# Patient Record
Sex: Female | Born: 1935 | Race: White | Hispanic: No | Smoking: Never smoker
Health system: Southern US, Community
[De-identification: ages and names within clinical notes are randomized; demographics above are authoritative.]

## PROBLEM LIST (undated history)

## (undated) DIAGNOSIS — T8789 Other complications of amputation stump: Secondary | ICD-10-CM

## (undated) DIAGNOSIS — T4145XA Adverse effect of unspecified anesthetic, initial encounter: Secondary | ICD-10-CM

## (undated) DIAGNOSIS — C4491 Basal cell carcinoma of skin, unspecified: Secondary | ICD-10-CM

## (undated) DIAGNOSIS — I739 Peripheral vascular disease, unspecified: Secondary | ICD-10-CM

## (undated) DIAGNOSIS — M1990 Unspecified osteoarthritis, unspecified site: Secondary | ICD-10-CM

## (undated) DIAGNOSIS — F419 Anxiety disorder, unspecified: Secondary | ICD-10-CM

## (undated) DIAGNOSIS — K922 Gastrointestinal hemorrhage, unspecified: Secondary | ICD-10-CM

## (undated) DIAGNOSIS — N200 Calculus of kidney: Secondary | ICD-10-CM

## (undated) DIAGNOSIS — M48061 Spinal stenosis, lumbar region without neurogenic claudication: Secondary | ICD-10-CM

## (undated) DIAGNOSIS — I499 Cardiac arrhythmia, unspecified: Secondary | ICD-10-CM

## (undated) DIAGNOSIS — T8859XA Other complications of anesthesia, initial encounter: Secondary | ICD-10-CM

## (undated) DIAGNOSIS — I998 Other disorder of circulatory system: Secondary | ICD-10-CM

## (undated) DIAGNOSIS — E785 Hyperlipidemia, unspecified: Secondary | ICD-10-CM

## (undated) DIAGNOSIS — M549 Dorsalgia, unspecified: Secondary | ICD-10-CM

## (undated) DIAGNOSIS — F329 Major depressive disorder, single episode, unspecified: Secondary | ICD-10-CM

## (undated) DIAGNOSIS — G709 Myoneural disorder, unspecified: Secondary | ICD-10-CM

## (undated) DIAGNOSIS — M419 Scoliosis, unspecified: Secondary | ICD-10-CM

## (undated) DIAGNOSIS — G8929 Other chronic pain: Secondary | ICD-10-CM

## (undated) DIAGNOSIS — I442 Atrioventricular block, complete: Secondary | ICD-10-CM

## (undated) DIAGNOSIS — I6529 Occlusion and stenosis of unspecified carotid artery: Secondary | ICD-10-CM

## (undated) DIAGNOSIS — I10 Essential (primary) hypertension: Secondary | ICD-10-CM

## (undated) DIAGNOSIS — Z950 Presence of cardiac pacemaker: Secondary | ICD-10-CM

## (undated) DIAGNOSIS — I872 Venous insufficiency (chronic) (peripheral): Secondary | ICD-10-CM

## (undated) DIAGNOSIS — F32A Depression, unspecified: Secondary | ICD-10-CM

## (undated) HISTORY — DX: Atrioventricular block, complete: I44.2

## (undated) HISTORY — DX: Unspecified osteoarthritis, unspecified site: M19.90

## (undated) HISTORY — PX: BACK SURGERY: SHX140

## (undated) HISTORY — PX: BASAL CELL CARCINOMA EXCISION: SHX1214

## (undated) HISTORY — DX: Gastrointestinal hemorrhage, unspecified: K92.2

## (undated) HISTORY — DX: Presence of cardiac pacemaker: Z95.0

## (undated) HISTORY — DX: Peripheral vascular disease, unspecified: I73.9

## (undated) HISTORY — DX: Venous insufficiency (chronic) (peripheral): I87.2

## (undated) HISTORY — PX: PR VEIN BYPASS GRAFT,AORTO-FEM-POP: 35551

## (undated) HISTORY — PX: FIXATION KYPHOPLASTY THORACIC SPINE: SHX1643

## (undated) HISTORY — DX: Hyperlipidemia, unspecified: E78.5

## (undated) HISTORY — DX: Essential (primary) hypertension: I10

## (undated) HISTORY — PX: CORNEAL TRANSPLANT: SHX108

## (undated) HISTORY — PX: KNEE ARTHROSCOPY: SHX127

## (undated) HISTORY — PX: REVISION TOTAL HIP ARTHROPLASTY: SHX766

## (undated) HISTORY — PX: HEMIARTHROPLASTY HIP: SUR652

## (undated) HISTORY — DX: Occlusion and stenosis of unspecified carotid artery: I65.29

## (undated) HISTORY — PX: CATARACT EXTRACTION W/ INTRAOCULAR LENS  IMPLANT, BILATERAL: SHX1307

## (undated) HISTORY — PX: TOTAL HIP ARTHROPLASTY: SHX124

---

## 1968-02-18 HISTORY — PX: ANTERIOR CERVICAL DECOMP/DISCECTOMY FUSION: SHX1161

## 1997-11-07 ENCOUNTER — Emergency Department (HOSPITAL_COMMUNITY): Admission: EM | Admit: 1997-11-07 | Discharge: 1997-11-07 | Payer: Self-pay | Admitting: Emergency Medicine

## 1997-11-07 ENCOUNTER — Encounter: Payer: Self-pay | Admitting: Emergency Medicine

## 2000-04-15 ENCOUNTER — Emergency Department (HOSPITAL_COMMUNITY): Admission: EM | Admit: 2000-04-15 | Discharge: 2000-04-15 | Payer: Self-pay | Admitting: Emergency Medicine

## 2000-05-04 ENCOUNTER — Encounter: Admission: RE | Admit: 2000-05-04 | Discharge: 2000-05-04 | Payer: Self-pay | Admitting: Orthopedic Surgery

## 2000-05-04 ENCOUNTER — Encounter: Payer: Self-pay | Admitting: Orthopedic Surgery

## 2000-05-05 ENCOUNTER — Ambulatory Visit (HOSPITAL_BASED_OUTPATIENT_CLINIC_OR_DEPARTMENT_OTHER): Admission: RE | Admit: 2000-05-05 | Discharge: 2000-05-05 | Payer: Self-pay | Admitting: Orthopedic Surgery

## 2000-06-27 ENCOUNTER — Inpatient Hospital Stay (HOSPITAL_COMMUNITY): Admission: EM | Admit: 2000-06-27 | Discharge: 2000-07-02 | Payer: Self-pay | Admitting: *Deleted

## 2000-06-27 ENCOUNTER — Emergency Department (HOSPITAL_COMMUNITY): Admission: EM | Admit: 2000-06-27 | Discharge: 2000-06-27 | Payer: Self-pay | Admitting: Emergency Medicine

## 2000-06-27 ENCOUNTER — Encounter: Payer: Self-pay | Admitting: Vascular Surgery

## 2000-06-30 ENCOUNTER — Encounter: Payer: Self-pay | Admitting: Vascular Surgery

## 2002-10-03 ENCOUNTER — Emergency Department (HOSPITAL_COMMUNITY): Admission: EM | Admit: 2002-10-03 | Discharge: 2002-10-03 | Payer: Self-pay | Admitting: Emergency Medicine

## 2002-10-03 ENCOUNTER — Encounter: Payer: Self-pay | Admitting: Emergency Medicine

## 2003-07-22 ENCOUNTER — Encounter: Admission: RE | Admit: 2003-07-22 | Discharge: 2003-07-22 | Payer: Self-pay | Admitting: Sports Medicine

## 2003-08-10 ENCOUNTER — Encounter: Admission: RE | Admit: 2003-08-10 | Discharge: 2003-08-10 | Payer: Self-pay | Admitting: Sports Medicine

## 2003-08-29 ENCOUNTER — Encounter: Admission: RE | Admit: 2003-08-29 | Discharge: 2003-08-29 | Payer: Self-pay | Admitting: Sports Medicine

## 2003-09-26 ENCOUNTER — Encounter: Admission: RE | Admit: 2003-09-26 | Discharge: 2003-09-26 | Payer: Self-pay | Admitting: Sports Medicine

## 2005-05-19 ENCOUNTER — Other Ambulatory Visit: Admission: RE | Admit: 2005-05-19 | Discharge: 2005-05-19 | Payer: Self-pay | Admitting: Interventional Radiology

## 2005-05-19 ENCOUNTER — Encounter: Admission: RE | Admit: 2005-05-19 | Discharge: 2005-05-19 | Payer: Self-pay | Admitting: Surgery

## 2005-05-19 ENCOUNTER — Encounter (INDEPENDENT_AMBULATORY_CARE_PROVIDER_SITE_OTHER): Payer: Self-pay | Admitting: Specialist

## 2005-10-12 ENCOUNTER — Emergency Department (HOSPITAL_COMMUNITY): Admission: EM | Admit: 2005-10-12 | Discharge: 2005-10-12 | Payer: Self-pay | Admitting: Emergency Medicine

## 2005-11-03 ENCOUNTER — Encounter: Admission: RE | Admit: 2005-11-03 | Discharge: 2005-11-03 | Payer: Self-pay | Admitting: Sports Medicine

## 2005-11-24 ENCOUNTER — Encounter: Admission: RE | Admit: 2005-11-24 | Discharge: 2005-11-24 | Payer: Self-pay | Admitting: Sports Medicine

## 2005-12-15 ENCOUNTER — Encounter: Admission: RE | Admit: 2005-12-15 | Discharge: 2005-12-15 | Payer: Self-pay | Admitting: Sports Medicine

## 2006-02-17 DIAGNOSIS — Z950 Presence of cardiac pacemaker: Secondary | ICD-10-CM

## 2006-02-17 HISTORY — DX: Presence of cardiac pacemaker: Z95.0

## 2006-07-08 ENCOUNTER — Ambulatory Visit: Payer: Self-pay | Admitting: Vascular Surgery

## 2006-09-23 ENCOUNTER — Emergency Department (HOSPITAL_COMMUNITY): Admission: EM | Admit: 2006-09-23 | Discharge: 2006-09-23 | Payer: Self-pay | Admitting: Family Medicine

## 2006-09-28 ENCOUNTER — Ambulatory Visit: Payer: Self-pay | Admitting: Internal Medicine

## 2006-10-30 ENCOUNTER — Ambulatory Visit: Payer: Self-pay | Admitting: Internal Medicine

## 2006-11-09 ENCOUNTER — Ambulatory Visit: Payer: Self-pay

## 2006-11-09 ENCOUNTER — Encounter: Payer: Self-pay | Admitting: Internal Medicine

## 2006-11-27 DIAGNOSIS — Z8679 Personal history of other diseases of the circulatory system: Secondary | ICD-10-CM

## 2006-11-27 DIAGNOSIS — S62109A Fracture of unspecified carpal bone, unspecified wrist, initial encounter for closed fracture: Secondary | ICD-10-CM | POA: Insufficient documentation

## 2006-11-27 DIAGNOSIS — I471 Supraventricular tachycardia: Secondary | ICD-10-CM

## 2006-11-27 DIAGNOSIS — I10 Essential (primary) hypertension: Secondary | ICD-10-CM

## 2007-01-12 ENCOUNTER — Ambulatory Visit: Payer: Self-pay | Admitting: Vascular Surgery

## 2007-03-17 ENCOUNTER — Ambulatory Visit: Payer: Self-pay | Admitting: Internal Medicine

## 2007-03-17 DIAGNOSIS — M1990 Unspecified osteoarthritis, unspecified site: Secondary | ICD-10-CM | POA: Insufficient documentation

## 2007-03-25 ENCOUNTER — Encounter: Payer: Self-pay | Admitting: Internal Medicine

## 2007-03-30 ENCOUNTER — Encounter: Payer: Self-pay | Admitting: Internal Medicine

## 2007-04-08 ENCOUNTER — Telehealth (INDEPENDENT_AMBULATORY_CARE_PROVIDER_SITE_OTHER): Payer: Self-pay | Admitting: *Deleted

## 2007-04-12 ENCOUNTER — Ambulatory Visit: Payer: Self-pay | Admitting: Internal Medicine

## 2007-05-06 ENCOUNTER — Telehealth: Payer: Self-pay | Admitting: Internal Medicine

## 2007-05-26 ENCOUNTER — Encounter: Payer: Self-pay | Admitting: Internal Medicine

## 2007-06-05 ENCOUNTER — Encounter: Payer: Self-pay | Admitting: Internal Medicine

## 2007-06-14 ENCOUNTER — Ambulatory Visit: Payer: Self-pay | Admitting: Internal Medicine

## 2007-07-05 ENCOUNTER — Ambulatory Visit: Payer: Self-pay | Admitting: Surgery

## 2007-07-09 ENCOUNTER — Ambulatory Visit: Payer: Self-pay | Admitting: Internal Medicine

## 2007-07-09 DIAGNOSIS — H8309 Labyrinthitis, unspecified ear: Secondary | ICD-10-CM | POA: Insufficient documentation

## 2007-07-09 DIAGNOSIS — R5383 Other fatigue: Secondary | ICD-10-CM

## 2007-07-09 DIAGNOSIS — E785 Hyperlipidemia, unspecified: Secondary | ICD-10-CM

## 2007-07-09 DIAGNOSIS — R5381 Other malaise: Secondary | ICD-10-CM

## 2007-07-09 LAB — CONVERTED CEMR LAB
BUN: 11 mg/dL (ref 6–23)
Basophils Relative: 0.4 % (ref 0.0–1.0)
CO2: 29 meq/L (ref 19–32)
Calcium: 9.5 mg/dL (ref 8.4–10.5)
Creatinine, Ser: 0.8 mg/dL (ref 0.4–1.2)
Eosinophils Relative: 2.7 % (ref 0.0–5.0)
Glucose, Bld: 84 mg/dL (ref 70–99)
Hemoglobin: 12.9 g/dL (ref 12.0–15.0)
Lymphocytes Relative: 46.6 % — ABNORMAL HIGH (ref 12.0–46.0)
MCHC: 33.6 g/dL (ref 30.0–36.0)
Monocytes Relative: 7.4 % (ref 3.0–12.0)
Neutro Abs: 2.7 10*3/uL (ref 1.4–7.7)
RBC: 4.43 M/uL (ref 3.87–5.11)
VLDL: 14 mg/dL (ref 0–40)

## 2007-07-14 ENCOUNTER — Encounter: Payer: Self-pay | Admitting: Internal Medicine

## 2007-08-03 ENCOUNTER — Telehealth: Payer: Self-pay | Admitting: Internal Medicine

## 2007-08-17 ENCOUNTER — Telehealth: Payer: Self-pay | Admitting: Internal Medicine

## 2007-08-31 ENCOUNTER — Encounter: Payer: Self-pay | Admitting: Internal Medicine

## 2007-09-06 ENCOUNTER — Telehealth (INDEPENDENT_AMBULATORY_CARE_PROVIDER_SITE_OTHER): Payer: Self-pay | Admitting: *Deleted

## 2007-09-28 ENCOUNTER — Ambulatory Visit: Payer: Self-pay | Admitting: Internal Medicine

## 2007-09-28 ENCOUNTER — Inpatient Hospital Stay (HOSPITAL_COMMUNITY): Admission: EM | Admit: 2007-09-28 | Discharge: 2007-10-03 | Payer: Self-pay | Admitting: Emergency Medicine

## 2007-10-08 ENCOUNTER — Encounter: Payer: Self-pay | Admitting: Internal Medicine

## 2007-10-15 ENCOUNTER — Telehealth: Payer: Self-pay | Admitting: Internal Medicine

## 2007-11-02 ENCOUNTER — Ambulatory Visit: Payer: Self-pay | Admitting: Internal Medicine

## 2007-11-02 DIAGNOSIS — D62 Acute posthemorrhagic anemia: Secondary | ICD-10-CM | POA: Insufficient documentation

## 2007-11-02 LAB — CONVERTED CEMR LAB
Basophils Absolute: 0.1 10*3/uL (ref 0.0–0.1)
Calcium: 9.4 mg/dL (ref 8.4–10.5)
Creatinine, Ser: 0.7 mg/dL (ref 0.4–1.2)
GFR calc Af Amer: 103 mL/min
Glucose, Bld: 89 mg/dL (ref 70–99)
HCT: 34.4 % — ABNORMAL LOW (ref 36.0–46.0)
Hemoglobin: 11.9 g/dL — ABNORMAL LOW (ref 12.0–15.0)
MCHC: 34.6 g/dL (ref 30.0–36.0)
Monocytes Absolute: 0.7 10*3/uL (ref 0.1–1.0)
Neutro Abs: 3.7 10*3/uL (ref 1.4–7.7)
RDW: 13.7 % (ref 11.5–14.6)
Sodium: 139 meq/L (ref 135–145)

## 2007-11-04 ENCOUNTER — Encounter: Payer: Self-pay | Admitting: Internal Medicine

## 2007-11-05 ENCOUNTER — Ambulatory Visit: Payer: Self-pay | Admitting: Internal Medicine

## 2007-11-05 ENCOUNTER — Encounter: Payer: Self-pay | Admitting: Internal Medicine

## 2007-11-22 ENCOUNTER — Encounter: Payer: Self-pay | Admitting: Internal Medicine

## 2007-12-28 ENCOUNTER — Ambulatory Visit: Payer: Self-pay | Admitting: Vascular Surgery

## 2008-02-18 HISTORY — PX: INSERT / REPLACE / REMOVE PACEMAKER: SUR710

## 2008-02-18 HISTORY — PX: ATRIAL ABLATION SURGERY: SHX560

## 2008-06-27 ENCOUNTER — Ambulatory Visit: Payer: Self-pay | Admitting: Vascular Surgery

## 2008-11-28 ENCOUNTER — Encounter: Payer: Self-pay | Admitting: Internal Medicine

## 2009-01-01 ENCOUNTER — Encounter: Payer: Self-pay | Admitting: Internal Medicine

## 2009-01-15 ENCOUNTER — Ambulatory Visit: Payer: Self-pay | Admitting: Vascular Surgery

## 2009-04-26 ENCOUNTER — Encounter: Payer: Self-pay | Admitting: Internal Medicine

## 2009-06-20 ENCOUNTER — Ambulatory Visit: Payer: Self-pay | Admitting: Internal Medicine

## 2009-06-20 ENCOUNTER — Inpatient Hospital Stay (HOSPITAL_COMMUNITY): Admission: EM | Admit: 2009-06-20 | Discharge: 2009-06-23 | Payer: Self-pay | Admitting: Pediatrics

## 2009-06-21 ENCOUNTER — Ambulatory Visit: Payer: Self-pay | Admitting: Physical Medicine & Rehabilitation

## 2009-08-06 ENCOUNTER — Ambulatory Visit
Admission: RE | Admit: 2009-08-06 | Discharge: 2009-08-07 | Payer: Self-pay | Source: Home / Self Care | Admitting: Orthopedic Surgery

## 2009-10-11 ENCOUNTER — Ambulatory Visit: Payer: Self-pay | Admitting: Internal Medicine

## 2009-10-11 DIAGNOSIS — I4891 Unspecified atrial fibrillation: Secondary | ICD-10-CM | POA: Insufficient documentation

## 2009-10-16 ENCOUNTER — Ambulatory Visit: Payer: Self-pay | Admitting: Internal Medicine

## 2009-11-11 ENCOUNTER — Ambulatory Visit: Payer: Self-pay | Admitting: Internal Medicine

## 2009-11-12 ENCOUNTER — Encounter: Payer: Self-pay | Admitting: Internal Medicine

## 2009-12-12 ENCOUNTER — Ambulatory Visit (HOSPITAL_COMMUNITY): Admission: RE | Admit: 2009-12-12 | Discharge: 2009-12-12 | Payer: Self-pay | Admitting: Neurological Surgery

## 2009-12-18 ENCOUNTER — Encounter: Payer: Self-pay | Admitting: Internal Medicine

## 2010-02-07 ENCOUNTER — Ambulatory Visit: Payer: Self-pay | Admitting: Surgery

## 2010-02-26 ENCOUNTER — Encounter: Payer: Self-pay | Admitting: Internal Medicine

## 2010-03-12 ENCOUNTER — Telehealth: Payer: Self-pay | Admitting: Internal Medicine

## 2010-03-14 ENCOUNTER — Ambulatory Visit
Admission: RE | Admit: 2010-03-14 | Discharge: 2010-03-14 | Payer: Self-pay | Source: Home / Self Care | Attending: Internal Medicine | Admitting: Internal Medicine

## 2010-03-14 ENCOUNTER — Encounter: Payer: Self-pay | Admitting: Physician Assistant

## 2010-03-14 DIAGNOSIS — I739 Peripheral vascular disease, unspecified: Secondary | ICD-10-CM | POA: Insufficient documentation

## 2010-03-14 DIAGNOSIS — I6529 Occlusion and stenosis of unspecified carotid artery: Secondary | ICD-10-CM | POA: Insufficient documentation

## 2010-03-19 NOTE — Letter (Signed)
Summary: [facility] Return Visit Note   [facility] Return Visit Note   Imported By: Roderic Ovens 01/08/2010 12:54:12  _____________________________________________________________________  External Attachment:    Type:   Image     Comment:   External Document

## 2010-03-19 NOTE — Letter (Signed)
Summary: Sherrell Puller MD/[facility]  Sherrell Puller MD/[facility]   Imported By: Lester [place] 11/07/2009 09:44:07  _____________________________________________________________________  External Attachment:    Type:   Image     Comment:   External Document

## 2010-03-19 NOTE — Assessment & Plan Note (Signed)
Summary: f2y   Primary Provider:  [place]  CC:  rov 2 year.  Pt daughter states that pt has EP in [place] that she sees.  .  History of Present Illness: Regina Hamilton is seen at her daughter's request to establish cardiac care in [place].  She has a history of atrial fibrillation that prompted AV junction ablation and pacing [employer]) last summer (2010). Attempts to initiate therapy with Coumadin concurrently with Cardizem resulted in a rash and the discontinuation of both drugs.  She has had a Myoview in our office in 2008 that was normal echo showed normal left ventricular function. She is seen in cardiac consultation by Dr. Dorthea Cove in May prior to hip replacement surgery.  she is functionally doing very well without limitations of chest pain or shortness of breath or peripheral edema.  She does has a history of vascular disease with femoropopliteal bypass. She has a history of clot in her leg. The chart describes both arterial and venous thrombosis. I am not sure which is the case. [id number] Current Medications (verified): 1)  Norvasc 10 Mg  Tabs (Amlodipine Besylate) .... Once Daily 2)  Bayer Aspirin 325 Mg  Tabs (Aspirin) .... Once Daily 3)  Ester-C   Tbcr (Bioflavonoid Products) .... Once Daily 4)  Potassium Gluconate 595 Mg  Tabs (Potassium Gluconate) .... Once Daily 5)  Ra Calcium Plus Vitamin D 600-125 Mg-Unit Tabs (Calcium-Vitamin D) .Marland Kitchen.. 1 Once Daily 6)  Lotemax 0.5 %  Susp (Loteprednol Etabonate) .... One Drop Each Eye Once A Day 7)  Alphagan P 0.1 %  Soln (Brimonidine Tartrate) .... One Drop Left Eye Twice A Day 8)  Diovan 160 Mg Tabs (Valsartan) .... Take 1 Tablet By Mouth Once A Day 9)  Hydrocodone-Acetaminophen 5-500 Mg  Tabs (Hydrocodone-Acetaminophen) .... As Needed 10)  Robaxin 500 Mg Tabs (Methocarbamol) .Marland Kitchen.. 1 Three Times A Day As Needed 11)  Tekturna 150 Mg Tabs (Aliskiren Fumarate) .Marland Kitchen.. 1 1/2 Once Daily 12)  Hydrochlorothiazide 25 Mg Tabs (Hydrochlorothiazide) ....  1/2 Tab Once Daily 13)  Super Stress B-Complex Cr  Cr-Tabs (Vitamins-Lipotropics) .Marland Kitchen.. 1 Once Daily 14)  Vitamin D 2000 Unit Tabs (Cholecalciferol) .Marland Kitchen.. 1 Once Daily 15)  Stool Softener 100 Mg Caps (Docusate Sodium) .Marland Kitchen.. 1 Once Daily  Allergies (verified): 1)  ! * Narcotics 2)  ! Iron 3)  ! * Avelox 4)  ! Versed  Past History:  Family History: Last updated: Oct 16, 2009 father- deceased @45 ; appendicitis w/ complications. mother - deceased @ 58; CAD Neg- breast, colon cancer; DM  Social History: Last updated: 16-Oct-2009 7th grade married -'47  widowed after 36 years '83 2 daughters -'97, '53;  work- Soil scientist room, 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 Alexia Freestone - [facility]       Opthal - Dr. Valentina Lucks- [place], q 6 months        PVSurgeon - [place]        NS - El[facility]ner, lumbar injections  Ortho - [place]/[facility]  Dr. 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 Thromboembolic risk factors are notable for hypertension, age, gender, vascular disease for a CHADS VASC score of 5 attempts to use Coumadin previously were unsuccessful. I have recommended the use of Pradaxa. He would like to discuss this amongst themselves and with her primary electrophysiologist in [place]. I did urge them to do this quickly as her thrombo- embolic risk is in the range probably of 5-8%annually. An alternative would be to add Plavix to aspirin based on the active W. trial Her updated medication list for this problem includes:    Bayer Aspirin 325 Mg Tabs (Aspirin) ..... Once daily  Problem # 2:  AV BLOCK, COMPLETE S/P AV JUNCTION ABLATION (ICD-426.0) stable following pacemaker Her updated medication list for this problem includes:    Norvasc 10 Mg Tabs (Amlodipine besylate) ..... Once daily    Bayer Aspirin 325 Mg Tabs (Aspirin) ..... Once daily  Problem # 3:  PACEMAKER, PERMANENT [employer] (ICD-V45.01) Device parameters and data were reviewed and no changes were made  Problem # 4:  HYPERTENSION (ICD-401.9) somewhat elevated today. She is on multiple medications. I will defer this to her primary care physician Her updated medication list for this problem includes:    Norvasc 10 Mg Tabs (Amlodipine besylate) ..... Once daily    Bayer Aspirin 325 Mg Tabs (Aspirin) ..... Once daily    Diovan 160 Mg Tabs (Valsartan) .Marland Kitchen... Take 1 tablet by mouth once a day    Tekturna 150 Mg Tabs (Aliskiren fumarate) .Marland Kitchen... 1 1/2 once daily    Hydrochlorothiazide 25 Mg Tabs (Hydrochlorothiazide) .Marland Kitchen... 1/2 tab once daily  Patient Instructions: 1)  Your physician recommends that you schedule a follow-up appointment in: AS NEEDED 2)  Your physician recommends that you continue  on your current medications as directed. Please refer to the Current Medication list given to you today.

## 2010-03-19 NOTE — Assessment & Plan Note (Signed)
Summary: PAIN IN HIP AND LEFT BACK BEHIND RIB CAGE/LB   Vital Signs:  Patient profile:   77 year old female Height:      64 inches Weight:      128 pounds BMI:     22.05 O2 Sat:      98 % on Room air Temp:     97.3 degrees F oral Pulse rate:   70 / minute BP sitting:   140 / 78  (left arm) Cuff size:   regular  Vitals Entered By: Bill Salinas CMA (October 16, 2009 2:00 PM)  O2 Flow:  Room air CC: pt here with c/o hip pain and back pain under left rib cage x 2 weeks/ ab   Primary Care Provider:  Cameryn Schum  CC:  pt here with c/o hip pain and back pain under left rib cage x 2 weeks/ ab.  History of Present Illness: Patient with acute chest wall pain left x 2 weeks. she does not recall any precipitating event.  She has had left THR 4 months ago. she has been walking and doing rehab. She is now having pain in the right Buttock and leg and knee. Most likely due to gait compensation for left knee and hip problems.   Current Medications (verified): 1)  Norvasc 10 Mg  Tabs (Amlodipine Besylate) .... Once Daily 2)  Bayer Aspirin 325 Mg  Tabs (Aspirin) .... Once Daily 3)  Ester-C   Tbcr (Bioflavonoid Products) .... Once Daily 4)  Potassium Gluconate 595 Mg  Tabs (Potassium Gluconate) .... Once Daily 5)  Ra Calcium Plus Vitamin D 600-125 Mg-Unit Tabs (Calcium-Vitamin D) .Marland Kitchen.. 1 Once Daily 6)  Lotemax 0.5 %  Susp (Loteprednol Etabonate) .... One Drop Each Eye Once A Day 7)  Alphagan P 0.1 %  Soln (Brimonidine Tartrate) .... One Drop Left Eye Twice A Day 8)  Diovan 160 Mg Tabs (Valsartan) .... Take 1 Tablet By Mouth Once A Day 9)  Hydrocodone-Acetaminophen 5-500 Mg  Tabs (Hydrocodone-Acetaminophen) .... As Needed 10)  Robaxin 500 Mg Tabs (Methocarbamol) .Marland Kitchen.. 1 Three Times A Day As Needed 11)  Tekturna 150 Mg Tabs (Aliskiren Fumarate) .Marland Kitchen.. 1 1/2 Once Daily 12)  Hydrochlorothiazide 25 Mg Tabs (Hydrochlorothiazide) .... 1/2 Tab Once Daily 13)  Super Stress B-Complex Cr  Cr-Tabs  (Vitamins-Lipotropics) .Marland Kitchen.. 1 Once Daily 14)  Vitamin D 2000 Unit Tabs (Cholecalciferol) .Marland Kitchen.. 1 Once Daily 15)  Stool Softener 100 Mg Caps (Docusate Sodium) .Marland Kitchen.. 1 Once Daily  Allergies (verified): 1)  ! * Narcotics 2)  ! Iron 3)  ! * Avelox 4)  ! Versed 5)  ! Cardizem 6)  ! Coumadin 7)  ! 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 Alexia Freestone - [place] [facility]       Opthal - Dr. Valentina Lucks- [place], q 6 months        PVSurgeon - [place]        NS - [facility], lumbar injections        Ortho - [place]  Dr. 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    Physical Exam  General:  WNWD elderly white female in no acute distress Head:  normocephalic and atraumatic.   Eyes:  C&S clear Ears:  R ear normal and L ear normal.   Lungs:  normal respiratory effort and normal breath sounds.   Heart:  normal rate and regular rhythm.   Msk:  Very tender in the  intercostal space at the posterior axillary line left ribs - 6-9.  Very tender to palpation of the piriformis right buttock. No tenderness to palpation of the right thigh and knee.  Neurologic:  alert & oriented X3 and cranial nerves II-XII intact.   Skin:  turgor normal, color normal, and no suspicious lesions.   Psych:  Oriented X3, memory intact for recent and remote, and good eye contact.     Impression & Recommendations:  Problem # 1:  CHEST WALL PAIN, ACUTE (EAV-409.81) patient with ntercostal inflammation left R6-9.   Plan - voltaren gel 4 g qid to affected area           meloxicam 7.5 mg  Her updated medication list for this problem includes:    Bayer Aspirin 325 Mg Tabs (Aspirin) ..... Once daily    Meloxicam 7.5 Mg Tabs (Meloxicam) .Marland Kitchen... 1 by mouth once daily  Problem # 2:  LEG PAIN, RIGHT (ICD-729.5) Patient with buttock pain c/w piriformis syndrome most likely due to gait alteration from left THR  Plan - meloxicam as above.   Complete Medication List: 1)  Norvasc 10 Mg Tabs (Amlodipine besylate) .... Once daily 2)  Bayer Aspirin 325 Mg Tabs (Aspirin) .... Once daily 3)  Ester-c Tbcr (Bioflavonoid products) .... Once daily 4)  Potassium Gluconate 595 Mg Tabs (Potassium gluconate) .... Once daily 5)  Ra Calcium Plus Vitamin D 600-125 Mg-unit Tabs (Calcium-vitamin d) .Marland Kitchen.. 1 once daily 6)  Lotemax 0.5 % Susp (Loteprednol etabonate) .... One drop each eye once a day 7)  Alphagan P 0.1 % Soln (Brimonidine tartrate) .... One drop left eye twice a day 8)  Diovan 160 Mg Tabs (Valsartan) .... Take 1 tablet by mouth once a day 9)  Hydrocodone-acetaminophen 5-500 Mg Tabs (Hydrocodone-acetaminophen) .... As needed 10)  Tekturna 150 Mg Tabs (Aliskiren fumarate) .Marland Kitchen.. 1 1/2 once daily 11)  Hydrochlorothiazide 25 Mg Tabs (Hydrochlorothiazide) .... 1/2 tab once daily 12)  Super Stress B-complex Cr Cr-tabs (Vitamins-lipotropics) .Marland Kitchen.. 1 once daily 13)  Vitamin D 2000 Unit Tabs  (Cholecalciferol) .Marland Kitchen.. 1 once daily 14)  Stool Softener 100 Mg Caps (Docusate sodium) .Marland Kitchen.. 1 once daily 15)  Meloxicam 7.5 Mg Tabs (Meloxicam) .Marland Kitchen.. 1 by mouth once daily 16)  Flector 1.3 % Ptch (Diclofenac epolamine) .... Apply patch to painful chest wll two times a day.  Other Orders: Home Health Referral (Home Health) Prescriptions: FLECTOR 1.3 % PTCH (DICLOFENAC EPOLAMINE) apply patch to painful chest wll two times a day.  #60 ptch x 12   Entered and Authorized by:   Jacques Navy MD   Signed by:   Jacques Navy MD on  10/16/2009   Method used:   Electronically to        [facility]  [phone]* (retail)       [address]       [place], [place]  [address]       Ph: [phone] or [phone]       Fax: [phone]   RxID:   [id number] MELOXICAM 7.5 MG TABS (MELOXICAM) 1 by mouth once daily  #30 x 12   Entered and Authorized by:   Jacques Navy MD   Signed by:   Jacques Navy MD on 10/16/2009   Method used:   Electronically to        [facility]  [phone]* (retail)       [address]       [place], [place]  [address]       Ph: [phone] or [phone]       Fax: [phone]   RxID:   [id number]

## 2010-03-19 NOTE — Miscellaneous (Signed)
Summary: Face to face encounter/[facility]  Face to face encounter/[facility]   Imported By: Sherian Rein 11/14/2009 14:38:35  _____________________________________________________________________  External Attachment:    Type:   Image     Comment:   External Document

## 2010-03-19 NOTE — Miscellaneous (Signed)
Summary: Care Plans/[facility]  Care Plans/[facility]   Imported By: Sherian Rein 11/13/2009 11:45:06  _____________________________________________________________________  External Attachment:    Type:   Image     Comment:   External Document

## 2010-03-19 NOTE — Procedures (Signed)
Summary: [facility]   Current Medications (verified): 1)  Norvasc 10 Mg  Tabs (Amlodipine Besylate) .... Once Daily 2)  Bayer Aspirin 325 Mg  Tabs (Aspirin) .... Once Daily 3)  Ester-C   Tbcr (Bioflavonoid Products) .... Once Daily 4)  Potassium Gluconate 595 Mg  Tabs (Potassium Gluconate) .... Once Daily 5)  Ra Calcium Plus Vitamin D 600-125 Mg-Unit Tabs (Calcium-Vitamin D) .Marland Kitchen.. 1 Once Daily 6)  Lotemax 0.5 %  Susp (Loteprednol Etabonate) .... One Drop Each Eye Once A Day 7)  Alphagan P 0.1 %  Soln (Brimonidine Tartrate) .... One Drop Left Eye Twice A Day 8)  Diovan 160 Mg Tabs (Valsartan) .... Take 1 Tablet By Mouth Once A Day 9)  Hydrocodone-Acetaminophen 5-500 Mg  Tabs (Hydrocodone-Acetaminophen) .... As Needed 10)  Robaxin 500 Mg Tabs (Methocarbamol) .Marland Kitchen.. 1 Three Times A Day As Needed 11)  Tekturna 150 Mg Tabs (Aliskiren Fumarate) .Marland Kitchen.. 1 1/2 Once Daily 12)  Hydrochlorothiazide 25 Mg Tabs (Hydrochlorothiazide) .... 1/2 Tab Once Daily 13)  Super Stress B-Complex Cr  Cr-Tabs (Vitamins-Lipotropics) .Marland Kitchen.. 1 Once Daily 14)  Vitamin D 2000 Unit Tabs (Cholecalciferol) .Marland Kitchen.. 1 Once Daily 15)  Stool Softener 100 Mg Caps (Docusate Sodium) .Marland Kitchen.. 1 Once Daily  Allergies (verified): 1)  ! * Narcotics 2)  ! Iron 3)  ! * Avelox 4)  ! Versed  PPM Specifications Following MD:  Sherryl Manges, MD     PPM Vendor:  [facility] [employer]     PPM Model Number:  [id number]     PPM Serial Number:  [id number] PPM DOI:  09/12/2008     PPM Implanting MD:  Windell Moment  Lead 1    Location: RA     DOI: 09/12/2008     Model #: [id number]     Serial #: [id number]     Status: active Lead 2    Location: RV     DOI: 09/12/2008     Model #: [id number]     Serial #: [id number]     Status: active  PPM Follow Up Battery Voltage:  GOOD V     Battery Est. Longevity:  >5 YRS     Pacer Dependent:  Yes       PPM Device Measurements Atrium  Amplitude: 2.5 mV, Impedance: 520 ohms,  Right Ventricle  Amplitude: PACED mV, Impedance: 520 ohms,  Threshold: 0.5 V at 0.5 msec  Episodes MS Episodes:  7.4K     Percent Mode Switch:  70%     Ventricular High Rate:  0     Atrial Pacing:  15%     Ventricular Pacing:  100%  Parameters Mode:  DDDR     Lower Rate Limit:  70     Upper Rate Limit:  120 Paced AV Delay:  80     Sensed AV Delay:  150 Tech Comments:  PT ESTABLISHING CARE IN [place]--HAS EP IN [place].  70% OF TIME IN MODE SWITCH.  - COUMADIN.  PACER DEPENDENT AT VVI 30.  NORMAL DEVICE FUNCTION.  TURNED RATE RESPONSE ON DURING MODE SWITCH.  NO OV OR DEVICE CHECKS SCHEDULED-as needed BASIS. Vella Kohler  October 11, 2009 10:06 AM

## 2010-03-19 NOTE — Cardiovascular Report (Signed)
Summary: Office Visit   Office Visit   Imported By: Roderic Ovens 10/30/2009 11:15:57  _____________________________________________________________________  External Attachment:    Type:   Image     Comment:   External Document

## 2010-03-20 ENCOUNTER — Encounter (INDEPENDENT_AMBULATORY_CARE_PROVIDER_SITE_OTHER): Payer: Medicare Other

## 2010-03-20 ENCOUNTER — Ambulatory Visit: Admit: 2010-03-20 | Payer: Self-pay | Admitting: Vascular Surgery

## 2010-03-20 ENCOUNTER — Ambulatory Visit (INDEPENDENT_AMBULATORY_CARE_PROVIDER_SITE_OTHER): Payer: Medicare Other | Admitting: Vascular Surgery

## 2010-03-20 ENCOUNTER — Other Ambulatory Visit (INDEPENDENT_AMBULATORY_CARE_PROVIDER_SITE_OTHER): Payer: Medicare Other

## 2010-03-20 DIAGNOSIS — I6529 Occlusion and stenosis of unspecified carotid artery: Secondary | ICD-10-CM

## 2010-03-20 DIAGNOSIS — Z48812 Encounter for surgical aftercare following surgery on the circulatory system: Secondary | ICD-10-CM

## 2010-03-20 DIAGNOSIS — I739 Peripheral vascular disease, unspecified: Secondary | ICD-10-CM

## 2010-03-20 DIAGNOSIS — I70219 Atherosclerosis of native arteries of extremities with intermittent claudication, unspecified extremity: Secondary | ICD-10-CM

## 2010-03-21 NOTE — Progress Notes (Signed)
Summary: pt needs surgical clearence  Phone Note Call from Patient   Caller: Daughter Darl Pikes [phone] or pt cell [phone] Reason for Call: Talk to Nurse Summary of Call: pt needs knee replacment surgery and will be put on a waitlist to be worked in if anythings opens up anytime soon, but needs surgical clearence, does dr Graciela Husbands need to see her to clear her? if so when can he see her? Initial call taken by: Glynda Jaeger,  March 12, 2010 8:35 AM  Follow-up for Phone Call        spoke w/Susan and will discuss w/Dr. Graciela Husbands to get clearance for Dr. Charlann Boxer. Follow-up by: Claris Gladden RN,  March 12, 2010 11:27 AM  Additional Follow-up for Phone Call Additional follow up Details #1::        per Dr. Graciela Husbands, set up appt to get clearance w/Scott.      Appended Document: pt needs surgical clearence set up appt w/Scott on thursday for pt. Claris Gladden, RN, BSN

## 2010-03-21 NOTE — Assessment & Plan Note (Addendum)
Summary: surgical clearance for knee surgery   Visit Type:  surg clearance Primary Provider:  Jacques Navy MD  CC:  revision of left hip surgery...denies any cardiac complaints today.....  History of Present Illness: Primary Electrophysiologist:  Dr. Sherryl Manges  Regina Hamilton is a 77 yo female with  a history of atrial fibrillation, s/p AV junction ablation and pacer implant [employer]) in 2010. Attempts to initiate therapy with Coumadin concurrently with Cardizem resulted in a rash and the discontinuation of both drugs.  She has carotid disease followed by Dr. Edilia Bo.  Her last doppler u/s I have available to me in 12/2008 demonstrated 1-39% RICA and 60-79% LICA stenosis.  She has PAD and had a left fem-pop bypass in the past.  Her last echo was in 10/2006 and demonstrated an EF 55%; mild MR and mild LAE.  A myoview was done in 2008 and was normal.  When she last saw Dr Graciela Husbands in 09/2009, Pradaxa was mentioned but the patient was to consider this.  She returns today for preop evaluation for revision of left THR on 04/08/10 with Dr. Charlann Boxer.    She denies chest pain or shortness of breath.  She is actually quite active for her age.  She rides a stationary bicycle several times a day.  She cleans her house, she walks, she does static exercises.  She denies any chest pain or shortness of breath with these.  She denies syncope.  She denies orthopnea, PND.  She has mild pedal edema.  Current Medications (verified): 1)  Norvasc 10 Mg  Tabs (Amlodipine Besylate) .... Once Daily 2)  Bayer Aspirin 325 Mg  Tabs (Aspirin) .... Once Daily 3)  Ester-C   Tbcr (Bioflavonoid Products) .... Once Daily 4)  Ra Calcium Plus Vitamin D 600-125 Mg-Unit Tabs (Calcium-Vitamin D) .Marland Kitchen.. 1 Once Daily 5)  Lotemax 0.5 %  Susp (Loteprednol Etabonate) .... One Drop Each Eye Once A Day 6)  Alphagan P 0.1 %  Soln (Brimonidine Tartrate) .... One Drop Left Eye Twice A Day 7)  Diovan 160 Mg Tabs (Valsartan) .... Take 1  Tablet By Mouth Once A Day 8)  Hydrocodone-Acetaminophen 5-500 Mg  Tabs (Hydrocodone-Acetaminophen) .... As Needed 9)  Tekturna 150 Mg Tabs (Aliskiren Fumarate) .Marland Kitchen.. 1 1/2 Once Daily 10)  Hydrochlorothiazide 25 Mg Tabs (Hydrochlorothiazide) .... 1/2 Tab Once Daily 11)  Super Stress B-Complex Cr  Cr-Tabs (Vitamins-Lipotropics) .Marland Kitchen.. 1 Once Daily 12)  Vitamin D 2000 Unit Tabs (Cholecalciferol) .Marland Kitchen.. 1 Once Daily 13)  Stool Softener 100 Mg Caps (Docusate Sodium) .Marland Kitchen.. 1 Once Daily  Allergies: 1)  ! * Narcotics 2)  ! Iron 3)  ! * Avelox 4)  ! Versed 5)  ! Cardizem 6)  ! Coumadin 7)  ! 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 Alexia Freestone - [place]       Opthal - Dr. Valentina Lucks- [place], q 6 months        PVSurgeon - [place]        NS - [place], lumbar injections  Ortho - [facility]  Dr. Margaretha Sheffield  Past Surgical History: Last updated: 10/16/2009 * CERVICAL FUSION '80 * FEMORAL-POPLITEAL BYPASS (LEFT)-'02 Right Knee arthroscopy '02 Cornea transplants '02, '05 Thyroid nodule excised '60s Epidural injections- x 3 Total hip replacement-right ASug '09 status post pacemaker insertion THR left May '11 Left knee arthroscopy June '11   G2P2  Review of Systems       As per  the HPI.  All other systems reviewed and negative.   Vital Signs:  Patient profile:   77 year old female Height:      64 inches Weight:      124.50 pounds BMI:     21.45 Pulse rate:   70 / minute Pulse rhythm:   regular BP  sitting:   148 / 54  (left arm) Cuff size:   regular  Vitals Entered By: Danielle Rankin, CMA (March 14, 2010 4:01 PM)  Physical Exam  General:  Well nourished, well developed, in no acute distress HEENT: normal Neck: no JVD Cardiac:  normal S1, S2; RRR; no murmur Lungs:  clear to auscultation bilaterally, no wheezing, rhonchi or rales Abd: soft, nontender, no hepatomegaly Ext: trace ankle edema Vascular: I cannot appreciate carotid  bruits Skin: warm and dry Neuro:  CNs 2-12 intact, no focal abnormalities noted    EKG  Procedure date:  03/14/2010  Findings:      prob underlying AFib V paced HR 70  PPM Specifications Following MD:  Sherryl Manges, MD     PPM Vendor:  [facility] [employer]     PPM Model Number:  [id number]     PPM Serial Number:  [id number] PPM DOI:  09/12/2008     PPM Implanting MD:  Windell Moment  Lead 1    Location: RA     DOI: 09/12/2008     Model #: [id number]     Serial #: [id number]     Status: active Lead 2    Location: RV     DOI: 09/12/2008     Model #: [id number]     Serial #: [id number]     Status: active  PPM Follow Up Pacer Dependent:  Yes      Parameters Mode:  DDDR     Lower Rate Limit:  70     Upper Rate Limit:  120 Paced AV Delay:  80     Sensed AV Delay:  150  Impression & Recommendations:  Problem # 1:  PRE-OPERATIVE CARDIOVASCULAR EXAMINATION (ICD-V72.81) She can definitely achieve 4 METS or greater without chest pain or shortness of breath.  Currently, she does not have any unstable cardiac conditions.  She had a normal Myoview study in 2008.  She has no known CAD.  According to ACC/AHA guidelines, she requires no further cardiac workup prior to her noncardiac surgery.  I discussed this with Dr. Myrtis Ser, who agreed.  She will likely be at mild to moderate risk.  Our service will certainly be available in the perioperative period as necessary.  Problem # 2:  CAROTID ARTERY STENOSIS, BILATERAL (ICD-433.10) She has followup with Dr. Edilia Bo next week.  Problem # 3:   UNSPECIFIED PERIPHERAL VASCULAR DISEASE (ICD-443.9) She has followup with Dr. Edilia Bo next week.  Problem # 4:  ATRIAL FIBRILLATION (ICD-427.31) Rate is controlled.  Problem # 5:  PACEMAKER, PERMANENT [employer] (ICD-V45.01) Her pacer rep can be contacted if there are any problems during her procedure.  Problem # 6:  HYPERTENSION (ICD-401.9) Slightly elevated.  This may be from pain.  Monitor for now.

## 2010-03-21 NOTE — Op Note (Signed)
Summary: [facility]   Imported By: Sherian Rein 03/05/2010 09:10:56  _____________________________________________________________________  External Attachment:    Type:   Image     Comment:   External Document

## 2010-03-27 ENCOUNTER — Encounter (HOSPITAL_COMMUNITY): Payer: Medicare Other | Attending: Orthopedic Surgery

## 2010-03-27 DIAGNOSIS — M810 Age-related osteoporosis without current pathological fracture: Secondary | ICD-10-CM | POA: Insufficient documentation

## 2010-03-27 DIAGNOSIS — I739 Peripheral vascular disease, unspecified: Secondary | ICD-10-CM | POA: Insufficient documentation

## 2010-03-27 DIAGNOSIS — Y831 Surgical operation with implant of artificial internal device as the cause of abnormal reaction of the patient, or of later complication, without mention of misadventure at the time of the procedure: Secondary | ICD-10-CM | POA: Insufficient documentation

## 2010-03-27 DIAGNOSIS — Z950 Presence of cardiac pacemaker: Secondary | ICD-10-CM | POA: Insufficient documentation

## 2010-03-27 DIAGNOSIS — Z01812 Encounter for preprocedural laboratory examination: Secondary | ICD-10-CM | POA: Insufficient documentation

## 2010-03-27 DIAGNOSIS — T84099A Other mechanical complication of unspecified internal joint prosthesis, initial encounter: Secondary | ICD-10-CM | POA: Insufficient documentation

## 2010-03-27 DIAGNOSIS — Z96649 Presence of unspecified artificial hip joint: Secondary | ICD-10-CM | POA: Insufficient documentation

## 2010-03-27 DIAGNOSIS — I10 Essential (primary) hypertension: Secondary | ICD-10-CM | POA: Insufficient documentation

## 2010-03-27 LAB — DIFFERENTIAL
Basophils Absolute: 0 10*3/uL (ref 0.0–0.1)
Basophils Relative: 0 % (ref 0–1)
Eosinophils Absolute: 0.1 10*3/uL (ref 0.0–0.7)
Neutrophils Relative %: 56 % (ref 43–77)

## 2010-03-27 LAB — BASIC METABOLIC PANEL
Chloride: 98 mEq/L (ref 96–112)
Creatinine, Ser: 0.94 mg/dL (ref 0.4–1.2)
GFR calc Af Amer: >60 mL/min (ref >60)
Potassium: 3.8 mEq/L (ref 3.5–5.1)

## 2010-03-27 LAB — URINALYSIS, ROUTINE W REFLEX MICROSCOPIC
Nitrite: NEGATIVE
Specific Gravity, Urine: 1.013 (ref 1.005–1.030)
Urobilinogen, UA: 0.2 mg/dL (ref 0.0–1.0)
pH: 6.5 (ref 5.0–8.0)

## 2010-03-27 LAB — APTT: aPTT: 31 seconds (ref 24–37)

## 2010-03-27 LAB — PROTIME-INR
INR: 0.92 (ref 0.00–1.49)
Prothrombin Time: 12.6 seconds (ref 11.6–15.2)

## 2010-03-27 LAB — CBC
MCH: 30 pg (ref 26.0–34.0)
Platelets: 222 10*3/uL (ref 150–400)
RBC: 4.46 MIL/uL (ref 3.87–5.11)
WBC: 8.5 10*3/uL (ref 4.0–10.5)

## 2010-03-27 LAB — SURGICAL PCR SCREEN: MRSA, PCR: NEGATIVE

## 2010-03-29 NOTE — Procedures (Unsigned)
CAROTID DUPLEX EXAM  INDICATION:  Carotid disease.  HISTORY: Diabetes:  No Cardiac:  Pacemaker Hypertension:  Yes Smoking:  No Previous Surgery:  Left femoral-to-popliteal bypass graft Jun 30, 2000. CV History:  Currently asymptomatic. Amaurosis Fugax No, Paresthesias No, Hemiparesis No                                      RIGHT             LEFT Brachial systolic pressure:         172               164 Brachial Doppler waveforms:         normal            normal Vertebral direction of flow:        antegrade         antegrade DUPLEX VELOCITIES (cm/sec) CCA peak systolic                   59                M=66, D=197 ECA peak systolic                   85                216 ICA peak systolic                   62                222 ICA end diastolic                   16                27 PLAQUE MORPHOLOGY:                  mixed             mixed PLAQUE AMOUNT:                      Mild              Moderate PLAQUE LOCATION:                    ICA, ECA          ICA, ECA, CCA  IMPRESSION: 1. Right internal carotid artery velocities suggest 1% to 39%     stenosis. 2. Left internal carotid artery peak systolic velocity suggests 60% to     79% stenosis. 3. No significant change in velocities when compared to the previous     exam 01/15/2009.  ___________________________________________ Di Kindle. Edilia Bo, M.D.  EM/MEDQ  D:  03/21/2010  T:  03/21/2010  Job:  [id number]

## 2010-03-29 NOTE — Assessment & Plan Note (Signed)
OFFICE VISIT  Regina, Hamilton DOB:  Feb 02, 1924                                       03/20/2010 ZOXWR#:[id number]  I saw Regina Hamilton in the office today for continued follow-up of her carotid disease and peripheral vascular disease.  I last saw her in May 2010.  Since I saw her last, she has had no history of stroke, TIAs, expressive or receptive aphasia, or amaurosis fugax.  She has had a previous left lower extremity bypass in 2002 and has had no claudication, rest pain or nonhealing ulcers.  Her past medical history is significant for hypertension and hypercholesterolemia.  In addition, she has a pacemaker.  SOCIAL HISTORY:  She is widowed.  She has 2 children.  She does not use tobacco.  REVIEW OF SYSTEMS:  CARDIOVASCULAR:  She had no chest pain, chest pressure, palpitations, she does have a history of fibrillation. MUSCULOSKELETAL:  She does have arthritis and joint pain.  PHYSICAL EXAMINATION:  This is a pleasant 77 year old woman who appears her stated age. Blood pressure 161/75, heart rate is 69, saturation 98%. LUNGS:  Clear bilaterally to auscultation. CARDIOVASCULAR:  She has a left carotid bruit.  She has a regular rate and rhythm.  She has palpable femoral pulses and warm well-perfused feet with mild bilateral lower extremity swelling. ABDOMEN:  Soft and nontender with normal pitched bowel sounds. NEUROLOGIC:  She has no focal weakness or paresthesias.  I did independently interpret her carotid duplex scan which shows a less than 39% right carotid stenosis with a 60-79% left carotid stenosis. Velocities on the left have not changed since her previous study in November 2010.  I have also independently interpreted her duplex of the graft which shows that her graft is widely patent with no areas of significant increased velocity.  She has monophasic signals in both feet with an ABI of 100% on the right and 76% on the left.  Overall I am  pleased with her progress.  She understands we would not consider left carotid endarterectomy unless the stenosis progressed to greater than 80% or she developed new neurologic symptoms.  I have ordered a follow-up carotid duplex scan in 6 months.  I will plan on seeing her back in 1 year unless she has had some change in her carotid duplex scan at 6 months.  In addition, she will be due to have a graft checked again in 1 year when I see her back.  She knows to call sooner if she has problems.    Di Kindle. Edilia Bo, M.D. Electronically Signed  CSD/MEDQ  D:  03/20/2010  T:  03/21/2010  Job:  [id number]

## 2010-04-03 ENCOUNTER — Encounter: Payer: Self-pay | Admitting: Internal Medicine

## 2010-04-03 NOTE — Procedures (Unsigned)
BYPASS GRAFT EVALUATION  INDICATION:  Followup bypass graft.  HISTORY: Diabetes:  No. Cardiac:  Pacemaker, cardiac ablation. Hypertension:  Yes. Smoking:  No. Previous Surgery:  Left femoral to popliteal bypass graft Jun 30, 2000. Other:  Hyperlipidemia.  SINGLE LEVEL ARTERIAL EXAM                              RIGHT              LEFT Brachial:                    172                164 Anterior tibial:             > 255              131 Posterior tibial:            245                118 Peroneal: Ankle/brachial index:        1.42               0.76  PREVIOUS ABI:  Date: 01/15/2009  RIGHT:  1.12  LEFT:  1.07  LOWER EXTREMITY BYPASS GRAFT DUPLEX EXAM:  DUPLEX:  Biphasic Doppler waveforms noted within the left lower extremity bypass graft with no focal stenosis visualized.  IMPRESSION: 1. Calcified vessels of the right lower extremity. 2. Left ankle brachial indices have declined from the previous study. 3. Patent left femoral popliteal bypass graft with no evidence of     focal stenosis.  ___________________________________________ Di Kindle. Edilia Bo, M.D.  EM/MEDQ  D:  03/21/2010  T:  03/21/2010  Job:  [id number]

## 2010-04-08 ENCOUNTER — Inpatient Hospital Stay
Admission: RE | Admit: 2010-04-08 | Discharge: 2010-04-11 | DRG: 468 | Disposition: A | Discharge status: Skilled Nursing Facility | Payer: Medicare Other | Source: Ambulatory Visit | Attending: Orthopedic Surgery | Admitting: Orthopedic Surgery

## 2010-04-08 ENCOUNTER — Ambulatory Visit (HOSPITAL_COMMUNITY): Payer: Medicare Other

## 2010-04-08 DIAGNOSIS — M25559 Pain in unspecified hip: Secondary | ICD-10-CM | POA: Diagnosis present

## 2010-04-08 DIAGNOSIS — Z96649 Presence of unspecified artificial hip joint: Secondary | ICD-10-CM

## 2010-04-08 DIAGNOSIS — Y831 Surgical operation with implant of artificial internal device as the cause of abnormal reaction of the patient, or of later complication, without mention of misadventure at the time of the procedure: Secondary | ICD-10-CM | POA: Diagnosis present

## 2010-04-08 DIAGNOSIS — I10 Essential (primary) hypertension: Secondary | ICD-10-CM | POA: Diagnosis present

## 2010-04-08 DIAGNOSIS — Z950 Presence of cardiac pacemaker: Secondary | ICD-10-CM

## 2010-04-08 DIAGNOSIS — T8489XA Other specified complication of internal orthopedic prosthetic devices, implants and grafts, initial encounter: Principal | ICD-10-CM | POA: Diagnosis present

## 2010-04-08 LAB — ABO/RH: ABO/RH(D): O POS

## 2010-04-09 LAB — CBC
HCT: 31.1 % — ABNORMAL LOW (ref 36.0–46.0)
Hemoglobin: 10.4 g/dL — ABNORMAL LOW (ref 12.0–15.0)
MCHC: 33.4 g/dL (ref 30.0–36.0)
MCV: 89.1 fL (ref 78.0–100.0)
RDW: 14.2 % (ref 11.5–15.5)

## 2010-04-09 LAB — BASIC METABOLIC PANEL
BUN: 15 mg/dL (ref 6–23)
CO2: 27 mEq/L (ref 19–32)
Calcium: 9.2 mg/dL (ref 8.4–10.5)
GFR calc non Af Amer: >60 mL/min (ref >60)
Glucose, Bld: 144 mg/dL — ABNORMAL HIGH (ref 70–99)
Potassium: 4 mEq/L (ref 3.5–5.1)
Sodium: 133 mEq/L — ABNORMAL LOW (ref 135–145)

## 2010-04-10 LAB — BASIC METABOLIC PANEL
CO2: 27 mEq/L (ref 19–32)
Calcium: 8.8 mg/dL (ref 8.4–10.5)
Creatinine, Ser: 0.74 mg/dL (ref 0.4–1.2)
GFR calc non Af Amer: >60 mL/min (ref >60)
Glucose, Bld: 113 mg/dL — ABNORMAL HIGH (ref 70–99)
Sodium: 132 mEq/L — ABNORMAL LOW (ref 135–145)

## 2010-04-10 LAB — CBC
HCT: 29.7 % — ABNORMAL LOW (ref 36.0–46.0)
Hemoglobin: 9.9 g/dL — ABNORMAL LOW (ref 12.0–15.0)
MCH: 30 pg (ref 26.0–34.0)
MCHC: 33.3 g/dL (ref 30.0–36.0)
RDW: 14.4 % (ref 11.5–15.5)

## 2010-04-10 NOTE — Letter (Signed)
Summary: [facility] Surgical Clearance   [facility] Surgical Clearance   Imported By: Roderic Ovens 04/01/2010 11:24:14  _____________________________________________________________________  External Attachment:    Type:   Image     Comment:   External Document

## 2010-04-12 NOTE — Op Note (Signed)
Regina Hamilton, Regina Hamilton                 ACCOUNT NO.:  [id number]  MEDICAL RECORD NO.:  [id number]           PATIENT TYPE:  O  LOCATION:  PADM                         FACILITY:  [facility]  PHYSICIAN:  Madlyn Frankel. Charlann Boxer, M.D.  DATE OF BIRTH:  21-Feb-1923  DATE OF PROCEDURE:  04/08/2010 DATE OF DISCHARGE:  03/27/2010                              OPERATIVE REPORT   PREOPERATIVE DIAGNOSIS:  Failed left hip hemiarthroplasty due to pain, most likely related to acetabular wear.  POSTOPERATIVE DIAGNOSIS:  Failed left hip hemiarthroplasty due to pain, most likely related to acetabular wear.  PROCEDURE:  Conversion of previous left hip surgery to a left total hip replacement maintaining the current femoral stem, utilizing the 52 Pinnacle cup 36 +4 neutral AltrX liner and 36 +1.5 aSphere metal ball.  SURGEON:  Madlyn Frankel. Charlann Boxer, MD  ASSISTANT:  Kallie Edward, NP  ANESTHESIA:  General.  SPECIMENS:  None.  COMPLICATIONS:  None.  DRAINS:  None.  BLOOD LOSS:  50-100 cc.  INDICATIONS TO THE PROCEDURE:  Ms. Nickelson is an 77 year old patient of mine who has at this point bilateral hip fractures.  On her right hip, we had performed a total hip replacement and on left hip,a hemiarthroplasty.  She has had persistent recurring groin pain since that time.  This has been worked up extensively and ruled out any potential issues with the degenerative lumbar spine.  Given her persistent groin pain and lack of problems with her right hip, she wished to proceed with a conversion.  Risks of dislocation, infection were all discussed and reviewed as well as persistent pain was extensively reviewed.  Consent was obtained for the benefit of pain relief.  PROCEDURE IN DETAIL:  The patient was brought into operative theater. Once adequate anesthesia, preoperative antibiotics, Ancef administered, the patient was positioned to the right lateral decubitus position with left side up.  Left lower extremity was then  prepped and draped in a sterile fashion.  Time-out was performed identifying the patient, planned procedure, and the extremity.  The patient's old incision was identified, and a lateral based incision was made.  Sharp dissection and soft-tissue planes carried down to the iliotibial band and gluteal fascia.  This was incised posteriorly.  Following initial exposure and dissection, the hip was dislocated and the old femoral head removed. Femoral head stem was noted to be stable.  The trunnion was then placed onto the ilium following removal of the labrum elevating the superior capsular tissues.  Once I debrided the labrum, I began reaming with a 45 reamer, reamed up to 51 reamer with good bony bed preparation, a 52 cup was chosen.  The final 52 cup was then impacted.  Cup positioner indicated about a 40- degree abduction angle and the cup appeared to be anatomic with a portion of the cup exposed superolateral as well as the portion beneath the anterior rim.  Single cancellus screw was placed and trial liner initially placed.  A trial reduction identified that the patient's hip looked to be very stable.  Combined anteversion was at this point noted to be about 45-50 degrees.  Leg lengths  appeared to be comparable given the fact that I was just replacing the acetabulum.  At this point, the hip was dislocated, the trials removed.  A hole eliminator placed and the final 36 +4 neutral AltrX liner was then packed into position with good overall position and security.  The final 36 +1.5 aSphere ball was then opened and impacted onto clean and dry trunnion and the hip reduced.  Hip was irrigated throughout the case and again at this point, there was no significant hemostasis required.  Given these findings, the posterior capsular tissue was reapproximated using #1 Vicryl.  No Hemovac drain was necessary.  The iliotibial band and gluteal fascia were then reapproximated over top of this using  #1 Vicryl.  The remainder of the wound was closed with 2-0 Vicryl in a subcutaneous layer, and a running 4-0 Monocryl was then used in the subcuticular layer.  Hip was cleaned, dried, and dressed sterilely using a glue sealant followed by Aquacel dressing.  The patient was then extubated and brought to the recovery room in stable condition tolerating the procedure well.     Madlyn Frankel Charlann Boxer, M.D.     MDO/MEDQ  D:  04/08/2010  T:  04/08/2010  Job:  [id number]  Electronically Signed by Durene Romans M.D. on 04/12/2010 07:02:20 AM

## 2010-04-12 NOTE — Discharge Summary (Signed)
  NAMECHRISANNA, Regina Hamilton                 ACCOUNT NO.:  [id number]  MEDICAL RECORD NO.:  [id number]           PATIENT TYPE:  I  LOCATION:  1620                         FACILITY:  [facility]  PHYSICIAN:  Madlyn Frankel. Charlann Boxer, M.D.  DATE OF BIRTH:  1923-11-12  DATE OF ADMISSION:  04/08/2010 DATE OF DISCHARGE:                              DISCHARGE SUMMARY   ADMITTING DIAGNOSIS:  Failed left hip hemiarthroplasty.  Conversion to total left hip arthroplasty.  BRIEF HISTORY:  This is a patient that was seen by Dr. Charlann Boxer for hip pain status post left hip hemiarthroplasty.  She has persistent pain and bothersome pain with range of motion that has limited her lifestyle and she decided to proceed with conversion to a left hip arthroplasty.  HOSPITAL COURSE:  The patient was admitted through same-day surgery on February 20.  She was taken to the operating theater and underwent the conversion to a total left hip arthroplasty without any difficulty.  She was taken to PACU for recovery and brought to [facility] for further recovery and rehabilitation.  Since that time she has advanced her diet to regular.  She has been up with physical therapy.  She is doing well with that.  She will be discharged to a facility today for further rehabilitation.  DISCHARGE DIAGNOSES: 1. Failed left hip hemiarthroplasty with conversion to a total-hip     arthroplasty. 2. Vertigo. 3. Anxiety. 4. Tinnitus. 5. Hypertension. 6. Peripheral vascular disease. 7. Hypercholesterolemia. 8. Atrial fibrillation. 9. Varicose veins. 10.Degenerative disk disease. 11.Osteopenia. 12.Osteoporosis. 13.Arthritis. 14.She is postmenopausal. 15.Urinary incontinence. 16.Stress. 17.Renal calculi. 18.Insomnia.  CONDITION ON DISCHARGE:  Good.  DISCHARGE INSTRUCTIONS:  Discharge instructions were given to the patient.  She knows that she can shower.  She needs to keep the wound dry with the Aquacel dressing in place.  That will come off in 8  days. She will follow up with Dr. Charlann Boxer in 2 weeks.  She is to remain at the facility until that time for rehabilitation.  DISCHARGE MEDICATIONS: 1. Acetaminophen 325 mg every 4 hours as needed. 2. Colace 100 mg twice daily. 3. Ferrous sulfate 325 mg three times a day. 4. Hydrocodone 5/325 one to two q.4-6 hours p.r.n. pain. 5. Robaxin 500 mg every 6 hours. 6. Xarelto 10 mg a day for 10 days. 7. Alphagan ophthalmic solution twice daily. 8. Amlodipine 10 mg every morning. 9. Aspirin 325 mg a day after the Xarelto. 10.Calcium with vitamin D daily. 11.Colace 100 mg as needed. 12.Diovan 160 mg every morning. 13.Ester-C daily. 14.Hydrochlorothiazide 12.5 daily. 15.Lotemax 0.5 eyedrops every morning. 16.Multivitamins daily. 17.Tekturna every morning. 18.Vitamin D. 19.Xanax as needed.     Russell L. Webb Silversmith, RN   ______________________________ Madlyn Frankel Charlann Boxer, M.D.    RLW/MEDQ  D:  04/11/2010  T:  04/11/2010  Job:  [id number]  Electronically Signed by Lauree Chandler NP-C on 04/11/2010 01:22:36 PM Electronically Signed by Durene Romans M.D. on 04/12/2010 07:02:25 AM

## 2010-04-25 NOTE — Medication Information (Signed)
Summary: Device Appt  Device Appt   Imported By: Marylou Mccoy 04/18/2010 15:46:50  _____________________________________________________________________  External Attachment:    Type:   Image     Comment:   External Document

## 2010-05-07 LAB — COMPREHENSIVE METABOLIC PANEL
ALT: 60 U/L — ABNORMAL HIGH (ref 0–35)
AST: 25 U/L (ref 0–37)
AST: 63 U/L — ABNORMAL HIGH (ref 0–37)
Albumin: 2.2 g/dL — ABNORMAL LOW (ref 3.5–5.2)
Alkaline Phosphatase: 39 U/L (ref 39–117)
BUN: 13 mg/dL (ref 6–23)
CO2: 28 mEq/L (ref 19–32)
Calcium: 9.6 mg/dL (ref 8.4–10.5)
Chloride: 99 mEq/L (ref 96–112)
Creatinine, Ser: 0.91 mg/dL (ref 0.4–1.2)
GFR calc Af Amer: 49 mL/min — ABNORMAL LOW (ref >60)
GFR calc non Af Amer: 59 mL/min — ABNORMAL LOW (ref >60)
Glucose, Bld: 125 mg/dL — ABNORMAL HIGH (ref 70–99)
Glucose, Bld: 96 mg/dL (ref 70–99)
Potassium: 4 mEq/L (ref 3.5–5.1)
Sodium: 134 mEq/L — ABNORMAL LOW (ref 135–145)
Total Bilirubin: 1 mg/dL (ref 0.3–1.2)
Total Protein: 4.2 g/dL — ABNORMAL LOW (ref 6.0–8.3)

## 2010-05-07 LAB — CBC
HCT: 20.4 % — ABNORMAL LOW (ref 36.0–46.0)
HCT: 23.2 % — ABNORMAL LOW (ref 36.0–46.0)
HCT: 28.6 % — ABNORMAL LOW (ref 36.0–46.0)
HCT: 29.1 % — ABNORMAL LOW (ref 36.0–46.0)
HCT: 33.7 % — ABNORMAL LOW (ref 36.0–46.0)
Hemoglobin: 10 g/dL — ABNORMAL LOW (ref 12.0–15.0)
Hemoglobin: 10.3 g/dL — ABNORMAL LOW (ref 12.0–15.0)
Hemoglobin: 11.9 g/dL — ABNORMAL LOW (ref 12.0–15.0)
Hemoglobin: 7.7 g/dL — ABNORMAL LOW (ref 12.0–15.0)
Hemoglobin: 9.5 g/dL — ABNORMAL LOW (ref 12.0–15.0)
MCHC: 34.1 g/dL (ref 30.0–36.0)
MCHC: 34.7 g/dL (ref 30.0–36.0)
MCHC: 35 g/dL (ref 30.0–36.0)
MCHC: 35.2 g/dL (ref 30.0–36.0)
MCV: 88.5 fL (ref 78.0–100.0)
MCV: 90 fL (ref 78.0–100.0)
MCV: 91.5 fL (ref 78.0–100.0)
MCV: 91.8 fL (ref 78.0–100.0)
Platelets: 113 10*3/uL — ABNORMAL LOW (ref 150–400)
Platelets: 120 10*3/uL — ABNORMAL LOW (ref 150–400)
Platelets: 127 10*3/uL — ABNORMAL LOW (ref 150–400)
RBC: 2.26 MIL/uL — ABNORMAL LOW (ref 3.87–5.11)
RBC: 2.97 MIL/uL — ABNORMAL LOW (ref 3.87–5.11)
RBC: 3.18 MIL/uL — ABNORMAL LOW (ref 3.87–5.11)
RBC: 3.27 MIL/uL — ABNORMAL LOW (ref 3.87–5.11)
RBC: 3.29 MIL/uL — ABNORMAL LOW (ref 3.87–5.11)
RBC: 3.8 MIL/uL — ABNORMAL LOW (ref 3.87–5.11)
RDW: 13.3 % (ref 11.5–15.5)
RDW: 13.6 % (ref 11.5–15.5)
RDW: 14.2 % (ref 11.5–15.5)
WBC: 12.9 10*3/uL — ABNORMAL HIGH (ref 4.0–10.5)
WBC: 7.7 10*3/uL (ref 4.0–10.5)
WBC: 8.7 10*3/uL (ref 4.0–10.5)
WBC: 8.8 10*3/uL (ref 4.0–10.5)
WBC: 9 10*3/uL (ref 4.0–10.5)
WBC: 9 10*3/uL (ref 4.0–10.5)
WBC: 9 10*3/uL (ref 4.0–10.5)

## 2010-05-07 LAB — DIFFERENTIAL
Basophils Absolute: 0 10*3/uL (ref 0.0–0.1)
Basophils Relative: 0 % (ref 0–1)
Basophils Relative: 0 % (ref 0–1)
Eosinophils Absolute: 0 10*3/uL (ref 0.0–0.7)
Eosinophils Relative: 0 % (ref 0–5)
Eosinophils Relative: 1 % (ref 0–5)
Lymphocytes Relative: 13 % (ref 12–46)
Lymphocytes Relative: 20 % (ref 12–46)
Lymphs Abs: 1.3 10*3/uL (ref 0.7–4.0)
Lymphs Abs: 1.5 10*3/uL (ref 0.7–4.0)
Monocytes Absolute: 0.6 10*3/uL (ref 0.1–1.0)
Monocytes Absolute: 0.7 10*3/uL (ref 0.1–1.0)
Monocytes Relative: 10 % (ref 3–12)
Monocytes Relative: 6 % (ref 3–12)
Neutro Abs: 5.2 10*3/uL (ref 1.7–7.7)
Neutrophils Relative %: 68 % (ref 43–77)
Neutrophils Relative %: 83 % — ABNORMAL HIGH (ref 43–77)

## 2010-05-07 LAB — GLUCOSE, CAPILLARY
Glucose-Capillary: 100 mg/dL — ABNORMAL HIGH (ref 70–99)
Glucose-Capillary: 118 mg/dL — ABNORMAL HIGH (ref 70–99)
Glucose-Capillary: 134 mg/dL — ABNORMAL HIGH (ref 70–99)
Glucose-Capillary: 136 mg/dL — ABNORMAL HIGH (ref 70–99)
Glucose-Capillary: 161 mg/dL — ABNORMAL HIGH (ref 70–99)

## 2010-05-07 LAB — BASIC METABOLIC PANEL
CO2: 23 mEq/L (ref 19–32)
CO2: 23 mEq/L (ref 19–32)
Calcium: 8.1 mg/dL — ABNORMAL LOW (ref 8.4–10.5)
Calcium: 8.3 mg/dL — ABNORMAL LOW (ref 8.4–10.5)
Chloride: 108 mEq/L (ref 96–112)
Creatinine, Ser: 0.78 mg/dL (ref 0.4–1.2)
Creatinine, Ser: 0.97 mg/dL (ref 0.4–1.2)
GFR calc Af Amer: >60 mL/min (ref >60)
GFR calc Af Amer: >60 mL/min (ref >60)
GFR calc non Af Amer: >60 mL/min (ref >60)
Sodium: 135 mEq/L (ref 135–145)
Sodium: 137 mEq/L (ref 135–145)

## 2010-05-07 LAB — TYPE AND SCREEN
ABO/RH(D): O POS
Antibody Screen: NEGATIVE

## 2010-05-07 LAB — CARDIAC PANEL(CRET KIN+CKTOT+MB+TROPI)
CK, MB: 1.3 ng/mL (ref 0.3–4.0)
CK, MB: 2.8 ng/mL (ref 0.3–4.0)
Total CK: 95 U/L (ref 7–177)
Troponin I: 0.01 ng/mL (ref 0.00–0.06)
Troponin I: 0.02 ng/mL (ref 0.00–0.06)

## 2010-05-07 LAB — URINALYSIS, ROUTINE W REFLEX MICROSCOPIC
Glucose, UA: NEGATIVE mg/dL
Hgb urine dipstick: NEGATIVE
Ketones, ur: NEGATIVE mg/dL
Protein, ur: NEGATIVE mg/dL
Urobilinogen, UA: 0.2 mg/dL (ref 0.0–1.0)

## 2010-05-07 LAB — IRON AND TIBC
Iron: 71 ug/dL (ref 42–135)
Saturation Ratios: 25 % (ref 20–55)
UIBC: 208 ug/dL

## 2010-05-07 LAB — MAGNESIUM: Magnesium: 1.8 mg/dL (ref 1.5–2.5)

## 2010-05-07 LAB — CK TOTAL AND CKMB (NOT AT ARMC): CK, MB: 1.7 ng/mL (ref 0.3–4.0)

## 2010-05-07 LAB — PROTIME-INR
INR: 0.95 (ref 0.00–1.49)
Prothrombin Time: 12.6 seconds (ref 11.6–15.2)

## 2010-05-07 LAB — PHOSPHORUS
Phosphorus: 2.1 mg/dL — ABNORMAL LOW (ref 2.3–4.6)
Phosphorus: 2.7 mg/dL (ref 2.3–4.6)

## 2010-05-07 LAB — TSH: TSH: 2.088 u[IU]/mL (ref 0.350–4.500)

## 2010-05-10 NOTE — H&P (Signed)
Regina Hamilton, GAMBONE                 ACCOUNT NO.:  [id number]  MEDICAL RECORD NO.:  [id number]           PATIENT TYPE:  O  LOCATION:  PADM                         FACILITY:  [facility]  PHYSICIAN:  Madlyn Frankel. Charlann Boxer, M.D.  DATE OF BIRTH:  02-27-1923  DATE OF ADMISSION:  03/27/2010 DATE OF DISCHARGE:                             HISTORY & PHYSICAL   ADMITTING DIAGNOSIS:  Status post left hemiarthroplasty failure and conversion to left total hip arthroplasty.  BRIEF HISTORY:  This is a patient, who was seen by Dr. Charlann Boxer for hip pain status post a left hip hemiarthroplasty.  She has had persistent pain and bothersome pain and range of motion that has somewhat limited her lifestyle and she decided to proceed with a conversion to a left hip total arthroplasty.  PAST MEDICAL HISTORY:  Quite significant: 1. She has history of vertigo and anxiety. 2. She has cataracts removed, impaired vision resultant. 3. Tinnitus from time to time. 4. Hypertension. 5. Peripheral vascular disease. 6. High cholesterol. 7. Atrial fibrillation with a pacemaker. 8. Varicose veins. 9. Degenerative disk disease. 10.Osteopenia. 11.Osteoporosis. 12.Fractures. 13.Arthritis. 14.Measles and mumps. 15.She is postmenopausal. 16.She has urinary incontinence with stress, not at night. 17.She has a history of renal calculi. 18.Some insomnia. 19.Loss of appetite with generalized joint pain and stiffness.  PAST SURGICAL HISTORY:  The surgical history is not included.  MEDICATIONS:  She does have current medication list of: 1. Amlodipine 10 mg daily. 2. Diovan 160 daily. 3. Tekturna 150 mg one and half pills every day. 4. Hydrochlorothiazide 12.5 a day. 5. Aspirin 325 mg a day. 6. Calcium, vitamin D daily. 7. Ester-C 500 mg daily. 8. Stress form of vitamins daily. 9. Vitamin D daily. 10.Stool softeners. 11.She takes Lotemax 0.5 one drop in the right and left eyes every     morning. 12.Alphagan 1% drop to the  left eye two times a day.  ALLERGIES:  She has medication allergies to: 1. Avelox. 2. Cardizem. 3. Coumadin. 4. Clonidine. 5. Versed. 6. Neurontin.  SOCIAL HISTORY:  Patient is widowed.  She is retired.  She has no history of tobacco, alcohol or drug abuse.  She has two children, both are nurses.  DISPOSITION:  Her disposition plan is for SNF at [facility] for rehab.  FAMILY HISTORY:  Her mother and father are both deceased, otherwise noncontributory.  REVIEW OF SYSTEMS:  Notable for those difficulties described in the history of present illness and past medical history, review of systems sheet is otherwise unremarkable.  PHYSICAL EXAMINATION:  GENERAL:  Health is poor.  She is postmenopausal. HEENT:  Does show the history of cataracts with diminished vision and tinnitus from time to time. NECK:  Unremarkable. CHEST:  Clear to auscultation bilaterally. HEART:  Has S1, S2.  There is no murmurs, rubs or gallops.  She does have a pacer in. ABDOMEN:  Soft and nondistended.  She has some diminished appetite. GU:  Shows stress incontinence, renal calculi history. EXTREMITIES:  Her extremity exam shows osteoarthritis, osteopenia and degenerative disk disease. SKIN:  Dermatologically, she is intact. NEUROLOGICAL:  She has history of vertigo.  DIAGNOSTIC STUDIES:  EKG and chest x-ray are pending through [facility].  IMPRESSION:  Failed left hip hemiarthroplasty.  PLAN:  She will convert that to a total arthroplasty with Dr. Charlann Boxer on the April 08, 2010.  If she has any difficulty in interim, she is advised to call.  Her discharge medications include:  Xarelto, MiraLAX, Colace, iron and Robaxin given to her today.  Her pain medicines will be given to her after discharge.     Russell L. Webb Silversmith, RN   ______________________________ Madlyn Frankel Charlann Boxer, M.D.    RLW/MEDQ  D:  03/27/2010  T:  03/27/2010  Job:  [id number]  Electronically Signed by Lauree Chandler NP-C on  04/01/2010 02:54:17 PM Electronically Signed by Durene Romans M.D. on 04/08/2010 10:49:09 AM

## 2010-07-02 NOTE — Assessment & Plan Note (Signed)
Regina Hamilton                         ELECTROPHYSIOLOGY OFFICE NOTE   OTHELLA, SLAPPEY                        MRN:          [id number]  DATE:10/30/2006                            DOB:          [birth year]    NEW PATIENT EVALUATION.   Regina Hamilton is referred from the emergency room.   She is an 77 year old woman with known peripheral vascular disease  status post fem-pop bypass by Dr. Edilia Bo with known carotid disease,  being followed apparently every 6 months by ultrasound, who has been  exceedingly fit.  About 3 months ago she started having spells of  exertionally provoked weakness accompanied by intermittent left  submammary discomfort, diaphoresis and shortness of breath.  There was  no accompanied nausea.  She would lie down and these symptoms would  resolve over the next 15 to 30 minutes.   Her daughters are both nurses, one of them witnessed one of these  spells.  Pallor was specifically noted not to accompany these spells;  her nurse daughter said she actually looked just weak.   There was no accompanied palpitations.   Her cardiac risk factors are notable for hypertension and a family  history.  Her cholesterol status is not known.  There is no diabetes and  she does not smoke.   Because of one of these spells, she presented herself to the emergency  room.  She was taken there because her daughter had taken her pulse and  found it to be in the 40's.  She was referred to the emergency room  where heart rates between 45 and 65 were identified.  Her vital signs  are not recorded on the data that I have.  She was referred for further  evaluation.   PAST MEDICAL HISTORY:  In addition to the above, is notable for:  1. Calf claudication in the context of her known peripheral vascular      disease.  2. GE reflux disease.  3. Fatigue.   PAST SURGICAL HISTORY:  Notable as above.  1. Cervical fusion.  2. Right wrist fusion.   REVIEW OF  SYSTEMS:  Notable for remote SVT, identified by monitoring,  accompanied by near syncope.  She saw Dr. Ladona Ridgel years ago and it was  elected to do nothing at that time.   Her medications are unchanged of late.  They include:  1. Benicar 40/12.5.  2. Norvasc 10.  3. Aspirin 325.  4. Potassium.   She has no known drug allergies.   SOCIAL HISTORY:  1. She is retired from [employer].  2. 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  3. She is widowed.  4. She has 2 children.  5. She does not use cigarettes, alcohol or recreational drugs.   EXAMINATION:  She is an elderly Caucasian female who is quite  loquacious.  Her blood pressure was 150/52, her pulse was 56, her weight  was 137.  HEENT EXAM:  No icterus, no xanthoma, there was poor dentition.  Her  neck veins were flat, her carotids were brisk and full bilaterally with  a left-sided bruit.  BACK:  Without kyphosis, scoliosis.  LUNGS:  Clear.  HEART SOUNDS:  Regular without murmurs.  There was a widely split S2.  ABDOMEN:  Soft without midline pulsation.  There was no hepatomegaly.  There was no discomfort.  The femoral pulses were 2+, distal pulses were  not palpable.  There was no clubbing, cyanosis or edema.  NEUROLOGICAL EXAM:  Grossly normal.  SKIN:  Warm and dry.   Electrocardiogram dated today demonstrated sinus rhythm at 57 with  intervals of 0.17/0.07/0.44, the axis was 4 degrees, ST segments were  flat.  There was poor R wave progression.   IMPRESSION:  1. Unusual spells characterized by weakness, shortness of breath and      chest discomfort.  2. Known peripheral vascular disease.  3. Cardiac risk factors, in addition to the above, notable for:      a.     Hypertension.      b.     Family history.  4. Modest bradycardia, question chronotropic incompetence.   Regina Hamilton has symptoms that are quite hard for me to define.  I am not  quite sure  what they represent.  I am concerned that they may be an  anginal equivalent, given their exertional nature and her known vascular  disease.   Will plan to undertake a stress Myoview scan.  I am looking both for  chronotropic competence as well as myocardial function and perfusion.  In the event that she cannot obtain an adequate heart rate, Adenosine  would be appropriate.   I would also like to get a 2D echo to look at left ventricular function  and structure.   In the event that these are unilluminating, we may have to go back to  the idea of using a monitor.  I should note that a CardioNet monitor was  used previously, she was not able to tolerate this for the few days that  she did, the only thing that was noted were PACs.     Duke Salvia, MD, [facility]  Electronically Signed    SCK/MedQ  DD: 10/30/2006  DT: 10/30/2006  Job #: [id number]

## 2010-07-02 NOTE — Procedures (Signed)
CAROTID DUPLEX EXAM   INDICATION:  Follow-up evaluation of known carotid artery disease.   HISTORY:  Diabetes:  No.  Cardiac:  No.  Hypertension:  Yes.  Smoking:  No.  Previous Surgery:  Left fem-pop bypass graft.  CV History:  Previous duplex revealed 20-39% right ICA stenosis and a 60-  79% left ICA stenosis.  Amaurosis Fugax No, Paresthesias No, Hemiparesis No                                       RIGHT             LEFT  Brachial systolic pressure:         176               176  Brachial Doppler waveforms:         Triphasic         Triphasic  Vertebral direction of flow:        Antegrade         Antegrade  DUPLEX VELOCITIES (cm/sec)  CCA peak systolic                   102               231  ECA peak systolic                   161               197  ICA peak systolic                   99                273  ICA end diastolic                   27                50  PLAQUE MORPHOLOGY:                  Calcified         Soft  PLAQUE AMOUNT:                      Mild              Moderate  PLAQUE LOCATION:                    Proximal ICA      Proximal ICA   IMPRESSION:  1. A 20-39% right ICA stenosis.  2. A 60-79% left ICA stenosis.  3. Distal left CCA stenosis approximately 50%.  4. No significant change from previous study.   ___________________________________________  Di Kindle. Edilia Bo, M.D.   MC/MEDQ  D:  12/28/2007  T:  12/28/2007  Job:  [id number]

## 2010-07-02 NOTE — Procedures (Signed)
CAROTID DUPLEX EXAM   INDICATION:  Carotid disease.   HISTORY:  Diabetes:  No.  Cardiac:  No.  Hypertension:  Yes.  Smoking:  No.  Previous Surgery:  Left lower extremity bypass graft.  CV History:  Currently asymptomatic.  Amaurosis Fugax No, Paresthesias No, Hemiparesis No                                       RIGHT             LEFT  Brachial systolic pressure:         138               151  Brachial Doppler waveforms:         Normal            Normal  Vertebral direction of flow:        Antegrade         Antegrade  DUPLEX VELOCITIES (cm/sec)  CCA peak systolic                   91                76  ECA peak systolic                   113               128  ICA peak systolic                   97                228  ICA end diastolic                   17                37  PLAQUE MORPHOLOGY:                  Mixed             Mixed  PLAQUE AMOUNT:                      Mild              Moderate  PLAQUE LOCATION:                    ICA/ECA           ICA/ECA   IMPRESSION:  1. 1%-39% stenosis of the right internal carotid artery.  2. Low end 60%-79% stenosis of the left internal carotid artery.  3. No significant change noted when compared to the previous exam on      06/27/2008.   ___________________________________________  Di Kindle. Edilia Bo, M.D.   CH/MEDQ  D:  01/16/2009  T:  01/17/2009  Job:  [id number]

## 2010-07-02 NOTE — Op Note (Signed)
Regina Hamilton, CHANNING                 ACCOUNT NO.:  [id number]   MEDICAL RECORD NO.:  [id number]          PATIENT TYPE:  INP   LOCATION:  5033                         FACILITY:  [facility]   PHYSICIAN:  Madlyn Frankel. Charlann Boxer, M.D.  DATE OF BIRTH:  August 30, 1923   DATE OF PROCEDURE:  09/29/2007  DATE OF DISCHARGE:                               OPERATIVE REPORT   PREOPERATIVE DIAGNOSIS:  Right femoral neck fracture.   POSTOPERATIVE DIAGNOSIS:  Right femoral neck fracture.   PROCEDURE:  Right total hip replacement.   COMPONENTS USED:  DePuy hip system size 48 pinnacle cup, two cancellous  bone screws, and 32 +4 neutral AltrX liner with a 6-high Tri-Lock stem  with a 32 +1 ball.   SURGEON:  Madlyn Frankel. Charlann Boxer, MD   ASSISTANT:  Lucita Lora. Shuford, PA-C   ANESTHESIA:  General.   ESTIMATED BLOOD LOSS:  400.   DRAINS:  None.   COMPLICATIONS:  None.   INDICATIONS FOR PROCEDURE:  Regina Hamilton is an 77 year old female who  unfortunately fell a day prior to admission.  She had increased  discomfort and was subsequently brought to the emergency room and  admitted to the hospital under the Medical Service on September 28, 2007.  She was initially seen and evaluated by one of my partners who asked for  definitive management.  Regina Hamilton has actually been a patient of mine  for some arthritic change in her right knee.  After reviewing her  current situation and fracture pattern with her, she did have a  relatively nondisplaced, perhaps slightly angulated femoral neck  fracture.  I had a lengthy discussion with Regina Hamilton and her daughter,  her power of attorney, about options.  Her daughter is a previous  surgical OR nurse who was aware of some of these options.  Given the  subcapital nature of the femoral neck fracture and the potential of some  rotational or angular deformity to it, we decided on a total hip  replacement.  Percutaneous cannula screws were discussed and opted to  not observe in hope for healing in  30-40% of chance.  Regina Hamilton is a  very active and this would definitely limit her for a period of time.  I  additionally discussed hemiarthroplasty as an option, but the potential  for progression of arthritis in acetabulum as well as acetabular pain  from metal ball in the acetabulum and need for revision surgery down the  road.  They subsequently opted for total hip replaced.  I discussed the  increased risks of dislocation, the risks of infection, DVT, component  failure, the need for revision surgery despite this.  The risks and  benefits were discussed and consent was obtained.   PROCEDURE IN DETAIL:  The patient was brought to the operative theater.  Once adequate anesthesia and preoperative antibiotics administered,  Ancef, the patient was positioned in the left lateral decubitus position  with the right side up.  The right lower extremity was pre-scrubbed and  prepped and draped in the sterile fashion.  A lateral-based incision was  then made.  Sharp dissection was carried to the iliotibial band and  gluteal fascia, which were incised in posteriorly for posterior approach  to the hip.  The short external rotators were identified and taken down  and separated from the posterior capsule.  An L-capsulotomy was made  preserving the posterior leaflet for later anatomic repair at the end of  the case but also to protect the sciatic nerve from the retractors.  Fracture site was identified and easily displaced with slight internal  rotation confirming that perhaps this is the best option.  A neck  osteotomy was made based on the trochanteric fossa to facilitate removal  of the head and due to subcapital nature.   I attended first to the femur where retractors were placed using this  broach to assure that I was out lateral enough on the neck.  I then used  starting reamer and a hand reamer.  I then irrigated the canal to  prevent fat emboli.  I began broach with a one broach setting my   anteversion at 20-25 degrees and broached all the way up to a size 6.  Marney Doctor was sat a little bit lower than my neck cut, and at that point, I  used a calcar planer to finish this off.   A sponge was placed into the femur.  The femur was then retracted  anteriorly and attention directed to the acetabulum.  Following  debridement of the labrum, I began reaming with 43 reamer and reamed up  to a 47 reamer.  At this point, I had a very good bleeding bone.  I did  not want to ream further to preserve the posterior wall.  I subsequently  impacted a 48 pinnacle cup setting the anteversion anatomically beneath  the anterior rim anteriorly and with the posterior ischium.  There was a  small portion of the cup exposed posterolaterally indicating correct  abduction as well as anteversion.  I checked with a hip guide and felt  that I had at least 20 degrees of forward flexion and 35 degrees of  abduction.  At this point, two cancellous screws were placed and a trial  liner placed 32 +4.  Trial reduction was now carried out with a 6,  initially standard neck length and a 32 +1 ball.  There is a bit of  impingement with internal rotation at 90 degrees of flexion.  Due to  this, I thought there is some soft tissue impingement and thus I tried  the high offset neck.  With this, there was much better stability.  Her  combined anteversion appeared to be adequate at 40-45 degrees.  There  was no evidence of impingement with external rotation and extension.  Given these parameters, all trial components removed.  Final components  opened and a hole eliminator was placed in the acetabular liner and the  final 32 +4 AltrX liner impacted without difficulty.  The final 6-high  Tri-Lock stem was then impacted.  I retrialed my head size just to make  sure I was happy with 32 +1 versus 32 +5.  I felt with 32 +5 ball even  though it was only couple of millimeters of length did affect the leg  length.  The 32 one  ball had about a millimeter of shuck.  Leg lengths  appeared to be very equal compared to how I had the leg position  preoperatively.  Given this, the 32 +1 ball was impacted under a dry  trunnion and the  hip reduced.  Hip was cleaned, dried, and irrigated  throughout the case.  I reapproximated the posterior capsule with  superior leaflet using #1 Ethibond.  The remaining wound was closed with  #1 Vicryl, which was an iliotibial band closure and a running gluteal  fascia closure.  A 2-0 Vicryl was used in the subcu layer and a running  3-0 Monocryl.  The hip was cleaned, dried, and dressed sterilely with  Steri-Strips, and a single Mepilex dressing.  She was brought to the  recovery room in stable condition tolerating the procedure well.      Madlyn Frankel Charlann Boxer, M.D.  Electronically Signed     MDO/MEDQ  D:  09/29/2007  T:  09/30/2007  Job:  [id number]

## 2010-07-02 NOTE — Discharge Summary (Signed)
[name], Hamilton NO.:  [id number]   MEDICAL RECORD NO.:  [id number]          PATIENT TYPE:  INP   LOCATION:  5033                         FACILITY:  [facility]   PHYSICIAN:  Leonides Grills, M.D.     DATE OF BIRTH:  [birth year]   DATE OF ADMISSION:  09/28/2007  DATE OF DISCHARGE:                               DISCHARGE SUMMARY   PRIMARY CARE PHYSICIAN:  Rosalyn Gess. Norins, MD, at [facility].   CARDIOLOGIST:  Duke Salvia, MD, [facility]   CHIEF COMPLAINT:  Right hip fracture.   HISTORY OF PRESENT ILLNESS:  Regina Hamilton is an 77 year old pleasant white  female who was in her usual state of health today at her home when she  felt the right foot slip causing her to fall.  She denies any loss of  consciousness, dizziness, chest pain, or shortness of breath.  The  patient reports recent bradycardia with a blood pressure medicine, which  has been stopped and she was placed on Diovan by her primary care  physician.  After today's fall, the patient was unable to bear weight on  the right hip.  She was brought to the ER where she was found to have  the right hip femoral neck fracture.   ALLERGIES:  1. AVELOX causes tongue swelling.  2. Unable to take DIURETICS secondary to decreased intraocular      pressure and dry eyes.   MEDICATIONS:  1. Aspirin 81 mg daily.  2. Magnesium.  3. Diovan 160 mg 1 p.o. daily.  4. Lotemax right drops 1 drop right eye in the a.m. and 1 drop left      eye a.m. and p.m.  5. Alphagan drops 0.1% 1 drop b.i.d. left eye.  6. Potassium 99 mg 1 p.o. daily.  7. Amlodipine 10 mg 1 p.o. daily.  8. Dimenhydrinate 50 mg one p.o. daily.  9. Extra-C 500 mg 1 p.o. daily.  10.Stresstabs 1 daily.   PAST MEDICAL HISTORY:  1. Hypertension.  2. Degenerative disk disease, lumbar and cervical spine.  3. PID with history of left femoral-popliteal bypass.  4. Increased intraocular pressure with a history of corneal      transplants.  5. History of cervical  fusion.  6. History of right knee arthroscopy.   SOCIAL HISTORY:  The patient denies any tobacco.  She lives at home.   REVIEW OF SYSTEMS:  The patient denies any chest pain, shortness of  breath, no loss of consciousness, and diabetes mellitus.  Positive for  hypertension.  Positive for intraocular pressure.  Questionable vertigo.   PHYSICAL EXAMINATION:  VITALS:  97.0 degrees Fahrenheit, blood pressure  is 157/66, heart rate 63, respiratory rate 16, and O2 saturation is 94%  on room air.  GENERAL:  The patient is in no acute distress.  She is alert and  oriented x3.  CARDIAC:  Regular rate and rhythm.  No murmurs, rubs, or gallops.  CHEST:  Clear to auscultation bilaterally.  No wheezing, rhonchi, or  rales.  ABDOMEN:  Soft and nontender.  BILATERAL LOWER EXTREMITIES:  She has no pain.  Gentle range of motion  of left hip with external and internal rotation.  No gross deformities  of the left hip.  Her right hip is painful with any attempted motion.  There is slight shortening of the right leg.  No external or internal  rotation.  Right knee with tenderness along the lateral joint line.  No  effusion.  No erythema, no calor.  CALVES:  Supple, nontender bilaterally.  FEET:  Neurovascularly intact bilaterally.   RADIOGRAPHS:  1. AP pelvis and right hip right femoral neck fracture with valgus      deformity impaction.  2. Chest x-ray no active disease.   IMPRESSION:  1. An 77 year old white female with right hip fracture.  2. History of hypertension.  3. Right knee pain.  We will obtain AP and lateral x-rays to rule out      fracture.  4. The patient is admitted by [facility] for medical workup and      clearance prior to surgery.  5. The patient to undergo right hip surgery once cleared by Medicine      most likely on September 29, 2007, by Dr. Charlann Boxer.  It will either be      right hip percutaneous pinning versus hemiarthroplasty versus total      hip  arthroplasty.      [name], P.A.      Leonides Grills, M.D.  Electronically Signed    GC/MEDQ  D:  09/28/2007  T:  09/29/2007  Job:  [id number]   cc:   Leonides Grills, M.D.

## 2010-07-02 NOTE — Procedures (Signed)
BYPASS GRAFT EVALUATION   INDICATION:  Follow up left fem-pop bypass graft.   HISTORY:  Diabetes:  No.  Cardiac:  No.  Hypertension:  Yes.  Smoking:  No.  Previous Surgery:  Left fem-pop bypass graft.   SINGLE LEVEL ARTERIAL EXAM                               RIGHT              LEFT  Brachial:                    158                164  Anterior tibial:             132                159  Posterior tibial:            163                163  Peroneal:  Ankle/brachial index:        0.99               0.99   PREVIOUS ABI:  Date: 07/06/2006  RIGHT:  0.97  LEFT:  0.97   LOWER EXTREMITY BYPASS GRAFT DUPLEX EXAM:   DUPLEX:  Patent left fem-pop bypass graft with no evidence of focal  stenosis.   IMPRESSION:  1. Patent left femoropopliteal bypass graft with no evidence of focal      stenosis.  2. Normal ankle brachial indices with biphasic Doppler waveforms noted      in bilateral legs, status post left femoropopliteal bypass graft.   ___________________________________________  Di Kindle. Edilia Bo, M.D.   MG/MEDQ  D:  01/12/2007  T:  01/12/2007  Job:  [id number]

## 2010-07-02 NOTE — Procedures (Signed)
BYPASS GRAFT EVALUATION   INDICATION:  Follow-up evaluation of lower extremity bypass graft.   HISTORY:  Diabetes:  No.  Cardiac:  No.  Hypertension:  Yes.  Smoking:  No.  Previous Surgery:  Left femoropopliteal bypass graft on Jun 30, 2000 by  Dr. Edilia Bo.   SINGLE LEVEL ARTERIAL EXAM                               RIGHT              LEFT  Brachial:                    207                205  Anterior tibial:             164                187  Posterior tibial:            183                189  Peroneal:  Ankle/brachial index:        0.88               0.91   PREVIOUS ABI:  Date:  December 28, 2007  RIGHT:  0.89  LEFT:  0.91   LOWER EXTREMITY BYPASS GRAFT DUPLEX EXAM:   DUPLEX:  Biphasic duplex waveform noted within left lower extremity  bypass graft and native arteries.   IMPRESSION:  1. Bilateral lower extremity ankle-brachial index suggests mild      arterial disease.  2. Patent left femoropopliteal bypass graft with no evidence of focal      stenosis.   ___________________________________________  Di Kindle. Edilia Bo, M.D.   AC/MEDQ  D:  06/27/2008  T:  06/27/2008  Job:  [id number]

## 2010-07-02 NOTE — H&P (Signed)
NAMEHAYDEE, Regina Hamilton                 ACCOUNT NO.:  [id number]   MEDICAL RECORD NO.:  [id number]          PATIENT TYPE:  INP   LOCATION:  5033                         FACILITY:  [facility]   PHYSICIAN:  Sean A. Everardo All, MD    DATE OF BIRTH:  07/23/1923   DATE OF ADMISSION:  09/28/2007  DATE OF DISCHARGE:                              HISTORY & PHYSICAL   REASON FOR ADMISSION:  Hip fracture.   HISTORY OF PRESENT ILLNESS:  An 77 year old woman today slipped on a rug  and fell, fracturing her hip.  Since then, she has several hours of  moderate right hip pain with no associated numbness.   PAST MEDICAL HISTORY:  1. Hypertension.  2. PAD.  3. She had bradycardia recently, but this resolved with changing her      beta-blocker to Diovan.  4. Chronic vertigo.  5. Osteoarthritis.   MEDICATIONS:  1. Norvasc 10 mg a day.  2. Diovan 80 mg a day.  3. Aspirin 325 mg a day.  4. Potassium gluconate 595 mg daily.  5. Alphagan 0.1% solution 1 drop in the left eye twice a day.  6. Vicodin 5/500, p.r.n. pain   SOCIAL HISTORY:  The patient is retired, and her daughter is at the  bedside.   FAMILY HISTORY:  Negative for the above.   REVIEW OF SYSTEMS:  Denies the following:  Weight loss, fever, headache,  seizure, visual loss, sore throat, earache, cough, shortness of breath,  chest pain, abdominal pain, rectal bleeding, hematuria, or vaginal  bleeding.   PHYSICAL EXAMINATION:  VITAL SIGNS:  Blood pressure 157/66, heart rate  63, respiratory rate 16, and temperature is 97.0.  GENERAL:  Elderly woman, lying supine, no distress.  SKIN:  No rash, not diaphoretic.  HEENT:  No evidence of head trauma to external examination.  No  periorbital swelling.  No scleral icterus.  Pharynx is normal.  NECK:  Supple.  CHEST:  Clear to auscultation.  No respiratory distress.  CARDIOVASCULAR:  No edema.  Regular rate and rhythm.  No murmur.  Pedal  pulses are decreased, but intact.  ABDOMEN:  Soft and  nontender.  No hepatosplenomegaly, no mass.  BREASTS, GYNECOLOGIC, RECTAL:  Not done at this time due to the  patient's condition.  EXTREMITIES:  Some osteoarthritic changes are noted.  Right lower  extremity cannot be moved due to her new hip fracture.  NEUROLOGIC:  Alert and oriented.  Cranial nerves appear to be intact and  she readily moves all 4s, except that I could only test her right lower  extremity by having her move her toes.   LABORATORY STUDIES:  Chest x-ray, no acute disease.  Electrocardiogram,  not currently available.  The following are negative or unremarkable  urinalysis, CBC, and BMET.   IMPRESSION:  1. Right hip fracture.  2. Hypertension with probably a situational component owing to her      pain.  3. History of recent bradycardia, better due to change in her beta-      blocker to angiotensin receptor blocker.  4. History of chronic vertigo.  It is uncertain what contribution if      any, the vertigo made to her fall today.   PLAN:  1. A 12-lead electrocardiogram.  2. If this is unremarkable, she is medically okay for surgery.  3. Continue her antihypertensives.  4. We will follow the patient.      Sean A. Everardo All, MD  Electronically Signed     SAE/MEDQ  D:  09/28/2007  T:  09/29/2007  Job:  [id number]

## 2010-07-02 NOTE — Discharge Summary (Signed)
NAMEAVERIE, HORNBAKER                 ACCOUNT NO.:  [id number]   MEDICAL RECORD NO.:  [id number]          PATIENT TYPE:  INP   LOCATION:  5033                         FACILITY:  [facility]   PHYSICIAN:  Titus Dubin. Hopper, MD,FACP,FCCPDATE OF BIRTH:  1923-05-04   DATE OF ADMISSION:  09/28/2007  DATE OF DISCHARGE:  10/03/2007                               DISCHARGE SUMMARY   ADMITTING DIAGNOSIS:  Right hip fracture.   OTHER DIAGNOSES:  1. Hypertension.  2. Peripheral arterial disease.   ADDITIONAL DISCHARGE DIAGNOSES:  1. Hyponatremia.  2. Hypokalemia.  3. Acute blood loss anemia.   For details concerning the hip fracture itself, please see the dictation  of September 28, 2007.   The right femoral fracture was the result of a fall , unrelated to any  cardiac or  neurologic triggers.  Her  beta-blocker therapy was  associated with bradycardia &was held.  Amlodipine 10 mg daily and  valsartan 160 mg daily were continued.   The patient was cleared medically for surgery.  She had previous history  of sinus tachycardia and femoral popliteal bypass, but was asymptomatic  preoperatively.   The surgery , a total right hip arthroplasty was completed 09/29/07.  There was 400 mL of blood loss.   Postoperatively, she was found to have mild hyponatremia, possibly due  to mild volume overload.  The serial sodium values demonstrated gradual  improvement.  Hypokalemia was treated with oral supplementation.   Potassium had been 3.4 and sodium 134; at the time of discharge,  potassium 3.6 and sodium 136.  On October 02, 2007, her hemoglobin and  hematocrit were 10.1 and 30; on the day of discharge, values were 9.6  and 28.5.   On the morning of discharge, she was asymptomatic, complaining of  minimal hip pain.  She denied any chest pain, shortness of breath,  abdominal pain, and she was afebrile.  Respiratory rate was 20, blood  pressure was 120/60, and O2 sats were 95% on room air.  She exhibited  an  S4 with slurring.  Chest was clear with no rales.  Abdomen was soft and  nontender.   DISCHARGE MEDICATIONS:  She was discharged on:  1. Robaxin 500 mg, 3 times as needed daily for muscle spasm.  2. Hydrocodone 8.5-325 every 6 hours as needed for pain.  3. Lovenox 40 mg qd  times 7 days.  Two daughters are nurses and feel      competent administering the Lovenox.  4. She was to take iron 325 mg 3 times a day for 2 weeks.   ORTHOPEDIC DISCHARGE INSTRUCTIONS:  50% weightbearing and wound care.  She was to continue her calcium and vitamin D supplements, as prior to  discharge.  She also is to continue her glaucoma drops.  Amlodipine 10  mg daily and Diovan 160 mg daily, will also be continued.   She was to see Dr. Cornelius Moras 2 weeks.  She will be asked to see Dr. Debby Bud in  1 month with repeat CBC and diff.      Titus Dubin. Alwyn Ren, MD,FACP,FCCP  Electronically Signed  [facility]/MEDQ  D:  10/03/2007  T:  10/03/2007  Job:  [id number]   cc:   Rosalyn Gess. Norins, MD  [place], Dr.

## 2010-07-02 NOTE — Assessment & Plan Note (Signed)
OFFICE VISIT   Regina Hamilton, Regina Hamilton  DOB:  February 04, 1924                                       06/27/2008  ZOXWR#:[id number]   I saw the patient in the office today for continued followup of  peripheral vascular disease and carotid disease.  This is a pleasant 77-  year-old woman who has undergone a previous left femoral to above knee  pop bypass with a vein graft in May of 2002.  Since I saw her last she  has had no significant claudication, rest pain or nonhealing ulcers.  She has had no history of stroke, TIAs, expressive or receptive aphasia  or amaurosis fugax.   PAST MEDICAL HISTORY:  Her past medical history is significant for  hypertension and possible remote myocardial infarction.  She denies any  history of diabetes, hypercholesterolemia, history of congestive heart  failure or history of emphysema.   SOCIAL HISTORY:  She is not a smoker.   REVIEW OF SYSTEMS AND MEDICATIONS:  Are documented on the medical  history form in her chart.   PHYSICAL EXAMINATION:  On physical examination this is a pleasant 13-  year woman who appears her stated age.  Her blood pressure was 201/69,  heart rate is 64.  Neck is supple.  She has a left carotid bruit.  Lungs  are clear bilaterally to auscultation.  On cardiac exam she has a  regular rate and rhythm.  Abdomen is soft and nontender.  She has  palpable femoral pulses and a palpable left popliteal pulse.  Neurologic  exam is nonfocal.  Both feet are warm and well-perfused without ischemic  ulcers.   Carotid duplex scan shows that she has a 60-79% left carotid stenosis.  These velocities are relatively stable compared to the study 6 months  ago.  Peak systolic velocity has increased somewhat.  She has a less  than 39% right carotid stenosis.  Both vertebral arteries are patent  with normally directed flow.   Ultrasound of her fem-pop bypass graft shows her graft is patent with no  areas of increased velocity.  She  has an ABI of 88% on the right and 91%  on the left.   Overall, I am pleased with her progress.  She understands we would not  consider left carotid endarterectomy unless the stenosis progressed to  greater than 80% of if she developed new neurologic symptoms.  I have  recommended a followup duplex scan in 6 months and I will see her back  at that time.  She knows to call sooner if she has problems.   Di Kindle. Edilia Bo, M.D.  Electronically Signed   CSD/MEDQ  D:  06/27/2008  T:  06/28/2008  Job:  [id number]

## 2010-07-02 NOTE — Procedures (Signed)
BYPASS GRAFT EVALUATION   INDICATION:  Follow-up evaluation of lower extremity bypass graft.   HISTORY:  Diabetes:  No.  Cardiac:  No.  Hypertension:  Yes.  Smoking:  No.  Previous Surgery:  Left fem-pop bypass graft on Jun 30, 2000, by Dr.  Edilia Bo.   SINGLE LEVEL ARTERIAL EXAM                               RIGHT              LEFT  Brachial:                    176                176  Anterior tibial:             140                160  Posterior tibial:            156                160  Peroneal:  Ankle/brachial index:        0.89               0.91   PREVIOUS ABI:  Date: 07/05/2007  RIGHT:  0.98  LEFT:  >1.0   LOWER EXTREMITY BYPASS GRAFT DUPLEX EXAM:   DUPLEX:  Doppler arterial waveforms are biphasic proximal to, within and  distal to the left fem-pop bypass graft.   IMPRESSION:  1. ABIs are stable from previous study bilaterally.  2. Patent left fem-pop bypass graft.   ___________________________________________  Di Kindle. Edilia Bo, M.D.   MC/MEDQ  D:  12/28/2007  T:  12/28/2007  Job:  [id number]

## 2010-07-02 NOTE — Procedures (Signed)
BYPASS GRAFT EVALUATION   INDICATION:  Follow-up evaluation of left fem-pop BPG.  Patient  complains of right lower extremity claudication at 1-1/2 blocks.  Patient denies rest pain.   HISTORY:  Diabetes:  No.  Cardiac:  No.  Hypertension:  Yes.  Smoking:  No.  Previous Surgery:  Left fem-pop BPG.   SINGLE LEVEL ARTERIAL EXAM                               RIGHT              LEFT  Brachial:                    170                168  Anterior tibial:             166                200  Posterior tibial:            160                >210  Peroneal:  Ankle/brachial index:        0.98               >1.0   PREVIOUS ABI:  Date: 01/12/07  RIGHT:  0.99  LEFT:  0.99   LOWER EXTREMITY BYPASS GRAFT DUPLEX EXAM:   DUPLEX:  1. Patent left CFA without evidence of stenosis.  2. Patent left fem-pop BPG with velocities ranging from 59 cm/s - 115      cm/s without evidence of stenosis.  3. Patent distal native artery.   IMPRESSION:  1. Stable ankle brachial indices bilaterally.  2. Patent left common femoral artery without evidence of stenosis.  3. Patent left femoropopliteal bypass graft without evidence of      stenosis.  4. Patent distal native artery.   ___________________________________________  Di Kindle. Edilia Bo, M.D.   PB/MEDQ  D:  07/07/2007  T:  07/07/2007  Job:  [id number]

## 2010-07-02 NOTE — Assessment & Plan Note (Signed)
OFFICE VISIT   Regina Hamilton, Regina Hamilton  DOB:  1923-10-17                                       01/12/2007  ZOXWR#:[id number]   I saw the patient in the office today for continued followup of her  peripheral vascular disease and carotid disease.  I have been following  her with a moderate left carotid stenosis.  She has also undergone a  previous left fem-pop bypass.   Since I saw her last in May of this year, she has had no real  claudication or rest pain in her lower extremities.  She has had no  history of stroke, TIA, expressive or receptive aphasia, or amaurosis  fugax.   REVIEW OF SYSTEMS:  CARDIAC:  She has had no chest pain, chest pressure,  palpitations, or arrhythmias.  PULMONARY:  She has had no bronchitis, asthma, or wheezing.   PHYSICAL EXAMINATION:  This is a pleasant 77 year old woman who appears  her stated age.  Blood pressure is 161/69, heart rate is 68.  She has no  cervical lymphadenopathy.  Lungs are clear bilaterally to auscultation.  She has a left carotid bruit.  Abdomen is soft and nontender.  She has  palpable femoral pulses and warm, well-perfused feet.  Neurological exam  is nonfocal.  She has good strength in her upper extremities and lower  extremities bilaterally.   Duplex scan of her graft in her leg today shows that her graft is widely  patent without any areas of concern.  ABI on the right is 99% and on the  left 99%.  She has biphasic Doppler signals in both feet.   Her carotid duplex scan shows the velocities on the left are slightly  higher, but she still has a 60-79% left carotid stenosis.  She has a  less than 39% right carotid stenosis.  She understands that we would not  consider left carotid endarterectomy unless the stenosis progressed to  greater than 80% or she felt new neurologic symptoms.  I will see her  back in 6 months for a followup duplex scan.  She knows to call sooner  if she has problems.  In the meantime,  she knows to continue taking her  aspirin.   Di Kindle. Edilia Bo, M.D.  Electronically Signed   CSD/MEDQ  D:  01/12/2007  T:  01/13/2007  Job:  [id number]

## 2010-07-02 NOTE — Procedures (Signed)
CAROTID DUPLEX EXAM   INDICATION:  Followup of known carotid artery disease.  Patient is  currently asymptomatic.   HISTORY:  Diabetes:  No.  Cardiac:  No.  Hypertension:  Yes.  Smoking:  No.  Previous Surgery:  Left fem-pop BPG.  CV History:  Amaurosis Fugax No, Paresthesias No, Hemiparesis No                                       RIGHT             LEFT  Brachial systolic pressure:         170               168  Brachial Doppler waveforms:         WNL               WNL  Vertebral direction of flow:        Antegrade         Antegrade  DUPLEX VELOCITIES (cm/sec)  CCA peak systolic                   102               237 (D)  ECA peak systolic                   174               276  ICA peak systolic                   111 (M)           350  ICA end diastolic                   26                60  PLAQUE MORPHOLOGY:                  Heterogenous      Heterogenous  PLAQUE AMOUNT:                      Mild              Moderate-severe  PLAQUE LOCATION:                    ICA, ECA          DCCA, ICA, ECA   IMPRESSION:  1. Right 20-39% internal carotid artery stenosis.  2. Left distal common carotid artery with peak systolic velocity of      237 cm/s, consistent with >50% stenosis with increased velocities      noted through to the proximal internal carotid artery, causing a 60-      79% stenosis with the greatest velocity taken at the origin.  3. Bilateral external carotid artery stenoses, left > right.  4. Bilateral antegrade flow in vertebral arteries.  5. Unable to replicate a velocity of 423/104 at the distal common      carotid artery/bifurcation, as previously noted on 01/12/07.   ___________________________________________  Di Kindle. Edilia Bo, M.D.   PB/MEDQ  D:  07/06/2007  T:  07/06/2007  Job:  [id number]

## 2010-07-02 NOTE — Procedures (Signed)
CAROTID DUPLEX EXAM   INDICATION:  Follow up known carotid artery disease.   HISTORY:  Diabetes:  No.  Cardiac:  No.  Hypertension:  Yes.  Smoking:  No.  Previous Surgery:  Left fem-pop bypass graft.  CV History:  Amaurosis Fugax No, Paresthesias No, Hemiparesis No                                       RIGHT             LEFT  Brachial systolic pressure:         158               164  Brachial Doppler waveforms:         Biphasic          Biphasic  Vertebral direction of flow:        Antegrade         Antegrade  DUPLEX VELOCITIES (cm/sec)  CCA peak systolic                   96                101  ECA peak systolic                   153               304  ICA peak systolic                   121               311  ICA end diastolic                   27                50  PLAQUE MORPHOLOGY:                  Heterogenous      Heterogenous  PLAQUE AMOUNT:                      Mild              Moderate-to-severe  PLAQUE LOCATION:                    ICA, ECA          Bifurcation, CCA,  ICA, ECA   IMPRESSION:  1. Possibly greater than 70% stenosis noted in the left distal common      carotid artery/bifurcation (423/104) noted.  2. 60-79% stenosis noted in the left internal carotid artery.  3. 20-39% stenosis noted in the right internal carotid artery.  4. Antegrade bilateral vertebral arteries.   ___________________________________________  Di Kindle. Edilia Bo, M.D.   MG/MEDQ  D:  01/12/2007  T:  01/12/2007  Job:  [id number]

## 2010-07-02 NOTE — Procedures (Signed)
CAROTID DUPLEX EXAM   INDICATION:  Followup of known carotid artery disease.   HISTORY:  Diabetes:  No  Cardiac:  No  Hypertension:  Yes  Smoking:  No  Previous Surgery:  No  CV History:  No  Amaurosis Fugax No, Paresthesias No, Hemiparesis No                                       RIGHT             LEFT  Brachial systolic pressure:         207               205  Brachial Doppler waveforms:         WNL               WNL  Vertebral direction of flow:        Antegrade         Antegrade  DUPLEX VELOCITIES (cm/sec)  CCA peak systolic                   132               238 distal  ECA peak systolic                   166               279  ICA peak systolic                   107               289  ICA end diastolic                   31                55  PLAQUE MORPHOLOGY:                  Heterogenous      Homogenous soft  PLAQUE AMOUNT:                      Mild              Moderate  PLAQUE LOCATION:                    BIF ICA           BIF ICA   IMPRESSION:  20 -39% right internal carotid artery stenosis.  60 -79% left internal carotid artery stenosis.   ___________________________________________  Di Kindle. Edilia Bo, M.D.   AC/MEDQ  D:  06/27/2008  T:  06/27/2008  Job:  [id number]

## 2010-07-05 NOTE — Op Note (Signed)
[place]  Patient:    Regina Hamilton, Regina Hamilton                          MRN: [id number] Proc. Date: 06/30/00 Attending:  Di Kindle. Edilia Bo, M.D.                           Operative Report  PREOPERATIVE DIAGNOSIS:  Left superficial femoral artery and tibial occlusive disease with rest pain in the left foot.  POSTOPERATIVE DIAGNOSIS:  Left superficial femoral artery and tibial occlusive disease with rest pain in the left foot.  OPERATION PERFORMED: 1. Left femoral to above knee popliteal artery bypass with  nonreversed    translocated saphenous vein graft. 2. Intraoperative arteriogram.  SURGEON:  Di Kindle. Edilia Bo, M.D.  ASSISTANT: 1. Demetrius Charity Bud Face, M.D. 2. Dominica Severin, P.A.  ANESTHESIA:  General.  INDICATIONS FOR PROCEDURE:  The patient is a 77 year old woman who presented with progressive ischemia of her left lower extremity.  She underwent an arteriogram which showed a superficial femoral artery occlusion and reconstitution of the above knee popliteal artery with single-vessel run off to the peroneal artery.  The patient had rest pain in the left foot and disabling claudication and therefore, left lower extremity revascularization was indicated.  The procedure and potential complications were discussed with the patient and her daughters preoperatively.  DESCRIPTION OF PROCEDURE:  The patient was brought to the operating room and received a general anesthetic.  The entire left lower extremity was prepped and draped in the usual sterile fashion.  Through an oblique incision in the left groin, the common femoral, superficial femoral and deep femoral arteries were dissected free and controlled with vessel loops.  Common femoral artery had an excellent pulse.  Through the same incision, the saphenofemoral junction was dissected free and then using three additional incisions along the medial aspect of the left thigh, the greater saphenous  vein was harvested to the level of the knee.  Branches were divided between clips and 3-0 silk ties.  Next, a separate incision was made for exposure of the above knee popliteal artery.  This was exposed and the large plexus of veins overlying the artery were dissected free to allow exposure of the anterior aspect of the artery.  The vein was then removed in its entirety and then a tunnel was created between the groin incision and the above knee popliteal artery incision and the patient was heparinized.  The femoral vein was clamped and then the saphenous vein excised from the femoral vein.  The femoral vein was then oversewn with a 5-0 Prolene suture.  Next, the proximal valve was sharply excised under direct vision.  The patient had been heparinized.  The common femoral artery was clamped proximally and then the superficial and deep femoral arteries were controlled with vessel loops.  A longitudinal arteriotomy made in the common femoral artery.  The vein was sewn in a nonreversed fashion end-to-side to the common femoral artery using continuous 6-0 Prolene suture.  The graft was then irrigated with heparinized saline.  A retrograde Damita Lack was used to lyse the valves.  The vein was then marked to prevent twisting.  It was then brought through the previously created tunnel for anastomosis to the above knee popliteal artery.  Tourniquet was placed on the thigh and the leg exsanguinated with an Esmarch bandage. The tourniquet was inflated to 250 mmHg.  Under  tourniquet control, the longitudinal arteriotomy was made in the above knee popliteal artery.  The vein graft was cut to the appropriate length, spatulated and sewn end-to-side to the above knee popliteal artery using continuous 6-0 Prolene suture parachuting both the heel and toe of the anastomosis.  Prior to completing the anastomosis, the vessels were back-bled and flushed appropriately and the anastomosis completed.  Flow  was re-established to the left leg. Intraoperative arteriogram was obtained which showed no technical problems. There was an excellent peroneal signal with the Doppler at completion which was graft dependent.  Hemostasis was obtained in the wounds, the wounds were closed with a deep layer of 3-0 Vicryl.  The skin was closed with staples. Sterile dressing was applied.  The patient tolerated the procedure well and was transferred to the recovery room in satisfactory condition. The sponge and needle counts were correct. DD:  06/30/00 TD:  07/01/00 Job: [id number]

## 2010-07-05 NOTE — Discharge Summary (Signed)
[place]. [facility]  Patient:    Regina Hamilton, Regina Hamilton                        MRN: [id number] Adm. Date:  [id number] Disc. Date: [id number] Attending:  Bennye Alm Dictator:   Adair Patter, P.A.C. CC:         Di Kindle. Edilia Bo, M.D.  Dr. Alvia Grove, M.D. [facility]   Discharge Summary  DATE OF BIRTH:  Oct 22, 1923.  ADMISSION DIAGNOSIS:  Left lower extremity claudication.  SECONDARY DIAGNOSES: 1. Hypertension. 2. Gastroesophageal reflux disease. 3. Supraventricular tachycardia.  DISCHARGE DIAGNOSIS:  Peripheral vascular occlusive disease.  HOSPITAL PROCEDURES: 1. Lower extremity angiography. 2. Left femoral-popliteal bypass graft. 3. Postoperative ankle brachial indexes.  HOSPITAL COURSE:  [name] was admitted to [facility] on Jun 27, 2000, secondary to progressive arterial occlusion of the lower extremity. Because of this, Di Kindle. Edilia Bo, M.D., admitted the patient and placed the patient on IV heparin.  He subsequently had the patient undergo lower extremity angiography on Jun 29, 2000.  After results of angiography, it was felt that the best course of action was peripheral bypass.  So, on Jun 30, 2000, the patient underwent a left femoral to above knee popliteal bypass graft with nonreversed saphenous vein graft and intraoperative arteriogram.  No complications were noted during the procedure. Postoperatively, the patient was found to have a patent graft.  She had postoperative ankle brachial indexes performed with good values. Postoperatively, her hospital course was complicated by brief episode of supraventricular tachycardia.  Because of this, electrolytes were checked. It was found that her magnesium and potassium were normal levels.  No further supraventricular tachycardia was noted, so the patient was discharged to home in stable condition on Jul 02, 2000.  MEDICATIONS AT TIME OF  DISCHARGE: 1. Percocet one to two tablets every four to six hours as needed for pain. 2. Zesteralic 70/12.5 one daily. 3. Norvasc 5 mg one tablet daily. 4. Prilosec 20 mg one tablet daily. 5. Aspirin 325 mg one tablet daily.  DISCHARGE ACTIVITY:  The patient was told to avoid driving, strenuous activity and lifting objects over 10 pounds. She was told to walk daily.  DISCHARGE DIET:  Low salt, low fat.  WOUND CARE:  The patient was told she could clean her incision with soap and water and that she may shower.  DISPOSITION:  Home.  FOLLOW-UP:  The patient was told to follow up with Dr. Edilia Bo at the [facility] office on Wednesday, July 22, 2000, at 3 oclock p.m.  She was also told to go to the [facility] office for staple removal on Tuesday, Jul 06, 2000, at 9:30 a.m. DD:  07/02/00 TD:  07/02/00 Job: [id number]

## 2010-07-05 NOTE — Op Note (Signed)
[place]. [facility]  Patient:    Regina Hamilton, Regina Hamilton                        MRN: [id number] Proc. Date: 06/29/00 Adm. Date:  [id number] Disc. Date: [id number] Attending:  Carren Rang K                           Operative Report  PREOPERATIVE DIAGNOSIS:  Left leg ischemia.  POSTOPERATIVE DIAGNOSIS:  Left leg ischemia.  OPERATION PERFORMED:  Aortogram with bilateral lower extremity run-off.  SURGEON:  Larina Earthly, M.D.  ANESTHESIA:  1% lidocaine local.  COMPLICATIONS:  None.  DISPOSITION:  To the holding area stable.  DESCRIPTION OF PROCEDURE:  The patient was taken to the peripheral vascular cath lab and placed in supine position where the area of the left groin was prepped and draped in sterile fashion.  Using local anesthesia and a single wall puncture, the right common femoral artery was entered.  The guide wire was passed up to the level of the superrenal aorta.  A 5 French sheath was passed over the guide wire and then a pigtail catheter was passed to the level of the superrenal aorta.  AP projection was undertaken.  There was good filling of the right and left renal arteries.  Only the right lower pole of the kidney filled and the patient probably had a higher right renal artery. Repeat injection was not undertaken  since this would not alter the treatment and the dye limitation was considered not to proceed with repeat injection of the aorta.  Next the catheter was pulled down to the level of the aortic bifurcation and run-off was obtained through this injection as well.  This revealed a widely patent iliac system bilaterally.  The patient had widely patent superficial femoral artery on the right and a superficial femoral and popliteal artery on the right.  There was peroneal artery run-off only on the right. On the left there was complete occlusion of the superficial femoral artery to the midthigh with reconstitution of the above knee popliteal  artery. Again there was peroneal artery run-off on the left with occlusion of the anterior tibial and posterior tibial arteries.  The opacification was not optimal and therefore the left iliac system was selected with a REM catheter and selective injections for better visualization below knee were obtained again showing peroneal artery run-off.  Finally, the right femoral sheath was injected again for better visualization of the below knee run-off on the right.  The patient tolerated the procedure well without any complications and was transferred to the holding area in stable condition.  FINDINGS: 1. No evidence of aortoiliac occlusive disease. 2. Right superficial femoral artery patency with right popliteal artery    patency and peroneal run-off. 3. Left superficial femoral artery occlusion with reconstitution of the    above knee popliteal artery and peroneal artery run-off. DD:  06/29/00 TD:  06/29/00 Job: [id number]

## 2010-07-05 NOTE — Op Note (Signed)
[place]. [facility]  Patient:    Regina Hamilton, Regina Hamilton                        MRN: [id number] Proc. Date: 05/05/00 Adm. Date:  [id number] Attending:  Twana First                           Operative Report  PREOPERATIVE DIAGNOSIS: Right knee medial meniscus tear.  POSTOPERATIVE DIAGNOSIS: Right knee medial and lateral meniscal tears with chondromalacia.  OPERATION/PROCEDURE:  1. Right knee examination under anesthesia followed by arthroscopic partial     medial and lateral meniscectomy.  2. Right knee chondroplasty.  SURGEON: Elana Alm. Thurston Hole, M.D.  ASSISTANT: Kirstin A. Shepperson, P.A.  ANESTHESIA: Local and MAC.  OPERATIVE TIME: Thirty minutes.  COMPLICATIONS: None.  INDICATIONS FOR PROCEDURE: Regina Hamilton is a 77 year old woman who sustained a twisting injury to her right knee approximately one month ago with significant pain and inability to ambulate secondary to pain, with signs and symptoms consistent with a medial meniscus tear, who has failed conservative care and is now to undergo arthroscopy.  DESCRIPTION OF PROCEDURE: Regina Hamilton was brought to the operating room on May 05, 2000 after a block had been placed in the holding room.  She was placed on the operating room table in the supine position and her right knee examined under anesthesia.  Range of motion was 0-125 degrees with 1-2+ crepitation noted.  Stable ligamentous examination with normal patella tracking.  The right leg was prepped using sterile Betadine and draped using sterile technique.  Originally through an inferolateral portal the arthroscope with a pump attached was placed and through an inferomedial portal an arthroscopic probe was placed.  On initial inspection of the medial compartment the articular cartilage and medial femoral condyle showed 30-40% grade 3 changes and 20% grade 4 changes of the medial femoral condyle, which was debrided. The medial tibial plateau  showed 30-40% grade 3 changes, which was debrided. The medial meniscus showed tearing of the posterior and medial horn, of which 30-40% was resected back to a stable rim.  The intercondylar notch and both the anterior and posterior cruciate ligaments were normal.  The lateral compartment was inspected and articular cartilage, lateral femoral condyle, and lateral tibial plateau showed 25-30% grade 3 and the rest grade and 1 2 changes.  There was lateral meniscus tear at the posterior and lateral horn, of which 25% was resected back to a stable rim.  The patellofemoral joint was inspected and mild grade 1-2 grade chondromalacia was noted.  The patella tracked normally.  Moderate synovitis in the medial and lateral gutters was debrided, otherwise they were free of pathology.  At this point it was felt that all pathology had been satisfactorily addressed.  Instruments were removed and portals were closed with 3-0 Nylon suture and injected with 0.25% Marcaine with epinephrine and 4 mg of morphine.  A sterile dressing was applied and the patient awakened and taken to the recovery room in stable condition.  FOLLOW-UP CARE: Regina Hamilton is to be followed as an outpatient, on Vicodin and Naprosyn.  I will see her back in the office in a week for suture removal and follow-up. DD:  05/05/00 TD:  05/06/00 Job: [id number]

## 2010-09-18 ENCOUNTER — Other Ambulatory Visit: Payer: Medicare Other

## 2010-09-20 ENCOUNTER — Other Ambulatory Visit (INDEPENDENT_AMBULATORY_CARE_PROVIDER_SITE_OTHER): Payer: Medicare Other

## 2010-09-20 DIAGNOSIS — I6529 Occlusion and stenosis of unspecified carotid artery: Secondary | ICD-10-CM

## 2010-09-25 NOTE — Procedures (Unsigned)
CAROTID DUPLEX EXAM  INDICATION:  Coronary artery disease.  HISTORY: Diabetes:  No. Cardiac:  Pacemaker. Hypertension:  Yes. Smoking:  No. Previous Surgery:  Left femoral to popliteal bypass graft 06/30/2000. CV History:  Currently asymptomatic. Amaurosis Fugax No, Paresthesias No, Hemiparesis No                                      RIGHT             LEFT Brachial systolic pressure:         168               170 Brachial Doppler waveforms:         Normal            Normal Vertebral direction of flow:        Antegrade         Antegrade DUPLEX VELOCITIES (cm/sec) CCA peak systolic                   75                M=69, D=180 ECA peak systolic                   67                88 ICA peak systolic                   84                178 ICA end diastolic                   26                40 PLAQUE MORPHOLOGY:                  Mixed             Mixed PLAQUE AMOUNT:                      Minimal           Moderate PLAQUE LOCATION:                    Bifurcation, ECA  ICA, ECA, CCA  IMPRESSION: 1. Right internal carotid artery velocities suggest 1%-39% stenosis. 2. Left internal carotid artery velocities suggest 60%-79% stenosis     (low end of range). 3. Essentially stable from the previous exam.  ___________________________________________ Di Kindle. Edilia Bo, M.D.  EM/MEDQ  D:  09/20/2010  T:  09/20/2010  Job:  [id number]

## 2010-10-01 ENCOUNTER — Ambulatory Visit (HOSPITAL_COMMUNITY)
Admission: RE | Admit: 2010-10-01 | Discharge: 2010-10-01 | DRG: 999 | Disposition: A | Discharge status: Home / Self Care | Payer: Medicare Other | Source: Ambulatory Visit | Attending: Neurological Surgery | Admitting: Neurological Surgery

## 2010-10-01 ENCOUNTER — Inpatient Hospital Stay (HOSPITAL_COMMUNITY): Payer: Medicare Other

## 2010-10-01 DIAGNOSIS — M8448XA Pathological fracture, other site, initial encounter for fracture: Principal | ICD-10-CM | POA: Insufficient documentation

## 2010-10-01 DIAGNOSIS — Z01810 Encounter for preprocedural cardiovascular examination: Secondary | ICD-10-CM | POA: Insufficient documentation

## 2010-10-01 DIAGNOSIS — I10 Essential (primary) hypertension: Secondary | ICD-10-CM | POA: Insufficient documentation

## 2010-10-01 DIAGNOSIS — Z01812 Encounter for preprocedural laboratory examination: Secondary | ICD-10-CM | POA: Insufficient documentation

## 2010-10-01 DIAGNOSIS — K449 Diaphragmatic hernia without obstruction or gangrene: Secondary | ICD-10-CM | POA: Insufficient documentation

## 2010-10-01 DIAGNOSIS — K219 Gastro-esophageal reflux disease without esophagitis: Secondary | ICD-10-CM | POA: Insufficient documentation

## 2010-10-01 DIAGNOSIS — Z79899 Other long term (current) drug therapy: Secondary | ICD-10-CM | POA: Insufficient documentation

## 2010-10-01 DIAGNOSIS — I739 Peripheral vascular disease, unspecified: Secondary | ICD-10-CM | POA: Insufficient documentation

## 2010-10-01 DIAGNOSIS — Z950 Presence of cardiac pacemaker: Secondary | ICD-10-CM | POA: Insufficient documentation

## 2010-10-01 DIAGNOSIS — Z01818 Encounter for other preprocedural examination: Secondary | ICD-10-CM | POA: Insufficient documentation

## 2010-10-01 LAB — BASIC METABOLIC PANEL
BUN: 18 mg/dL (ref 6–23)
Chloride: 100 mEq/L (ref 96–112)
Creatinine, Ser: 0.78 mg/dL (ref 0.50–1.10)
Glucose, Bld: 89 mg/dL (ref 70–99)
Potassium: 3.9 mEq/L (ref 3.5–5.1)

## 2010-10-01 LAB — CBC
HCT: 35.4 % — ABNORMAL LOW (ref 36.0–46.0)
Hemoglobin: 12.2 g/dL (ref 12.0–15.0)
MCHC: 34.5 g/dL (ref 30.0–36.0)
MCV: 84.3 fL (ref 78.0–100.0)
WBC: 6.5 10*3/uL (ref 4.0–10.5)

## 2010-10-15 ENCOUNTER — Other Ambulatory Visit (HOSPITAL_COMMUNITY): Payer: Self-pay | Admitting: Neurological Surgery

## 2010-10-15 DIAGNOSIS — M549 Dorsalgia, unspecified: Secondary | ICD-10-CM

## 2010-10-24 NOTE — Op Note (Signed)
NAMEJEANANN, BALINSKI NO.:  [id number]  MEDICAL RECORD NO.:  [id number]  LOCATION:  3172                         FACILITY:  [facility]  PHYSICIAN:  Stefani Dama, M.D.  DATE OF BIRTH:  [birth year]  DATE OF PROCEDURE:  10/01/2010 DATE OF DISCHARGE:                              OPERATIVE REPORT   PREOPERATIVE DIAGNOSIS:  L2 pathologic compression fracture.  POSTOPERATIVE DIAGNOSIS:  L1 and L2 pathologic compression fracture.  PROCEDURE:  L1-L2 acrylic balloon kyphoplasty.  SURGEON:  Stefani Dama, MD  ANESTHESIA:  General endotracheal.  INDICATIONS:  Regina Hamilton is an 77 year old individual who I saw about a week ago.  She has been evaluated for previous episodes of back pain. She has degenerative scoliosis and a spondylolisthesis at L5-S1; however, she notes that her back pain was more in the mid back in the thoracolumbar junction and new radiographs demonstrated that in addition to her scoliosis, she now had what appear to be a compression fracture of the L2 vertebra.  I had discussed this with her and being that she had been hurt for several weeks, we advised an acrylic balloon kyphoplasty.  The patient was brought into the operating room for this procedure.  However, at the time of initial imaging in preparation for the kyphoplasty, it was also noted that L1 had a new fracture that was not previously noted on her plain x-rays in the office.  We then elected to proceed with both L1 and L2 acrylic balloon kyphoplasties.  PROCEDURE:  The patient was brought to the operating room supine on the stretcher.  After smooth induction of general endotracheal anesthesia, she was turned prone and carefully positioned on the operating table. The back was prepped with alcohol and DuraPrep and draped in a sterile fashion.  AP and lateral fluoroscopy was used to localize the vertebra in question.  Since at this time that we noted that there was a new fracture on the  left lateral aspect of L1 which was not previously noted on plain radiographs in the office, it was decided at this point by comparing to the office films, the fracture was indeed new and we proceeded with both L1 and L2 acrylic balloon kyphoplasties.  By localizing the 10 o'clock position for the pedicles at L2, a stab incision was made in the skin after infiltrating with 1 mL of lidocaine with epinephrine and a Jamshidi needle was then placed to the lateral aspect of the pedicular wall at the junction of the pedicle and transverse process and into the vertebral body under image intensifier visualization.  This was then also done at L1.  When the cannulas were positioned correctly, the inner cannula was removed and a bone drill was used to drill through the bone which was very soft and friable.  Balloon was then inserted into L2 and this was inflated to 150 mmHg pressure and rapidly deteriorated to approximately 70-80 mm.  It filled with bone void in the center and left lateral aspect of the vertebral body.  A similar process was carried out in L1 and similarly the bone was noted be very disintegrated and easily filled and returned to a resting pressure  about 70 mmHg.  A 4 mL of contrast were instilled into the balloon and at this time, cement was mixed and allowed to harden to proper consistency.  Then, a total of 6 mL of cement was then placed in L2 and 4.5 mL of cement was placed in L1 under direct visualization with each cannula delivering 1.5 mL of cement.  Once the cementing was completed, good filling was noted particularly on the left lateral aspects which was weightbearing surface of the vertebrae in her scoliosis at L1 and L2. With this being completed, the inner cannulas were removed.  No evidence of a tail existed. Final radiographs were obtained in AP and lateral projections.  A singular stitch of 3-0 Vicryl in inverted interrupted fashion was placed in the subcuticular  tissues and Band-Aid was applied.  The patient tolerated the procedure well and was returned to recovery room in stable condition.     Stefani Dama, M.D.     Merla Riches  D:  10/01/2010  T:  10/02/2010  Job:  [id number]  Electronically Signed by Barnett Abu M.D. on 10/24/2010 03:15:45 PM

## 2010-10-24 NOTE — Discharge Summary (Signed)
  NAMESEVANNAH, MADIA NO.:  [id number]  MEDICAL RECORD NO.:  [id number]  LOCATION:  3172                         FACILITY:  [facility]  PHYSICIAN:  Stefani Dama, M.D.  DATE OF BIRTH:  February 10, 1924  DATE OF ADMISSION:  10/01/2010 DATE OF DISCHARGE:  10/01/2010                              DISCHARGE SUMMARY   ADMITTING DIAGNOSIS:  L2 pathologic compression fracture for osteoporosis.  DISCHARGE DIAGNOSIS:  L2 pathologic compression fracture for osteoporosis.  BRIEF HISTORY AND HOSPITAL COURSE:  Regina Hamilton is an 77 year old female who has had acute on chronic low back pain, degenerative scoliosis, spondylolisthesis L5-S1.  Her back was more in her thoracolumbar junction and had become quite intense.  She was found to have an L2 compression fracture.  It has been increasing in severity of the pain. She also had an L1 compression fracture and she was advised on L1 and L2 acrylic balloon kyphoplasty for pain control and to prevent further kyphotic deformity.  She tolerated this procedure well on October 01, 2010, stabilized in the recovery room, and she was discharged from the recovery room.  She was eating well, voiding well, vital signs stable, afebrile, and her pain was well controlled.  DISCHARGE CONDITION:  Stable, improved.  DISCHARGE INSTRUCTIONS:  Discharged home.  She is to continue on her home medications of Xanax, vitamin D3, Tekturna, Lotemax, hydrochlorothiazide, vitamin C, Diovan, calcium carbonate, aspirin.  She is to contact our office prior to followup if she has any question or concerns.  Follow up in 3-4 weeks with Dr. Danielle Dess.  Continue with back precautions.  Eating well, voiding well.     [name].   ______________________________ Stefani Dama, M.D.    SCI/MEDQ  D:  10/17/2010  T:  10/17/2010  Job:  [id number]  Electronically Signed by Velora Heckler. on 10/22/2010 09:38:10 AM Electronically Signed by Barnett Abu M.D. on  10/24/2010 03:15:48 PM

## 2010-11-15 LAB — CROSSMATCH

## 2010-11-15 LAB — BASIC METABOLIC PANEL WITH GFR
BUN: 17
CO2: 26
Calcium: 9.1
Creatinine, Ser: 0.74
GFR calc non Af Amer: >60
Glucose, Bld: 110 — ABNORMAL HIGH

## 2010-11-15 LAB — URINALYSIS, ROUTINE W REFLEX MICROSCOPIC
Bilirubin Urine: NEGATIVE
Glucose, UA: NEGATIVE
Hgb urine dipstick: NEGATIVE
Ketones, ur: NEGATIVE
Nitrite: NEGATIVE
Protein, ur: NEGATIVE
Specific Gravity, Urine: 1.019
Urobilinogen, UA: 1
pH: 7

## 2010-11-15 LAB — CBC
HCT: 27.8 — ABNORMAL LOW
HCT: 30.8 — ABNORMAL LOW
HCT: 37.9
Hemoglobin: 12.8
Hemoglobin: 9.4 — ABNORMAL LOW
MCHC: 33.7
MCHC: 33.8
MCV: 87.6
Platelets: 109 — ABNORMAL LOW
Platelets: 119 — ABNORMAL LOW
Platelets: 171
RBC: 4.32
RDW: 14.2
RDW: 14.4
WBC: 13 — ABNORMAL HIGH
WBC: 8

## 2010-11-15 LAB — DIFFERENTIAL
Basophils Absolute: 0.1
Basophils Relative: 1
Eosinophils Absolute: 0.1
Eosinophils Relative: 1
Lymphocytes Relative: 15
Lymphs Abs: 2
Monocytes Absolute: 0.7
Monocytes Relative: 5
Neutro Abs: 10.1 — ABNORMAL HIGH
Neutrophils Relative %: 78 — ABNORMAL HIGH

## 2010-11-15 LAB — BASIC METABOLIC PANEL
BUN: 8
Calcium: 7.9 — ABNORMAL LOW
Chloride: 100
Chloride: 105
GFR calc Af Amer: >60
GFR calc Af Amer: >60
GFR calc non Af Amer: >60
Glucose, Bld: 108 — ABNORMAL HIGH
Potassium: 3.4 — ABNORMAL LOW
Potassium: 4
Potassium: 4.2
Sodium: 132 — ABNORMAL LOW
Sodium: 139

## 2010-11-15 LAB — ABO/RH: ABO/RH(D): O POS

## 2010-11-15 LAB — PROTIME-INR: INR: 1

## 2010-12-02 LAB — URINALYSIS, ROUTINE W REFLEX MICROSCOPIC
Bilirubin Urine: NEGATIVE
Ketones, ur: NEGATIVE
Nitrite: NEGATIVE
Protein, ur: NEGATIVE
Urobilinogen, UA: 0.2

## 2010-12-02 LAB — DIFFERENTIAL
Basophils Absolute: 0
Basophils Relative: 0
Eosinophils Absolute: 0.1
Eosinophils Relative: 2
Lymphocytes Relative: 34
Monocytes Absolute: 0.6

## 2010-12-02 LAB — POCT CARDIAC MARKERS
CKMB, poc: 1.2
Myoglobin, poc: 80.2
Troponin i, poc: <0.05

## 2010-12-02 LAB — I-STAT 8, (EC8 V) (CONVERTED LAB)
BUN: 12
Bicarbonate: 27.1 — ABNORMAL HIGH
Glucose, Bld: 83
Hemoglobin: 13.3
Sodium: 134 — ABNORMAL LOW
pH, Ven: 7.428 — ABNORMAL HIGH

## 2010-12-02 LAB — CBC
HCT: 36.3
Hemoglobin: 12.3
MCHC: 34
MCV: 86.8
Platelets: 208
RDW: 13.1

## 2010-12-02 LAB — POCT I-STAT CREATININE: Operator id: 279831

## 2010-12-25 ENCOUNTER — Encounter: Payer: Self-pay | Admitting: Physician Assistant

## 2011-03-21 ENCOUNTER — Encounter: Payer: Self-pay | Admitting: Vascular Surgery

## 2011-03-25 ENCOUNTER — Encounter: Payer: Self-pay | Admitting: Vascular Surgery

## 2011-03-26 ENCOUNTER — Encounter (INDEPENDENT_AMBULATORY_CARE_PROVIDER_SITE_OTHER): Payer: Medicare Other | Admitting: *Deleted

## 2011-03-26 ENCOUNTER — Ambulatory Visit (INDEPENDENT_AMBULATORY_CARE_PROVIDER_SITE_OTHER): Payer: Medicare Other | Admitting: Vascular Surgery

## 2011-03-26 ENCOUNTER — Encounter: Payer: Self-pay | Admitting: Vascular Surgery

## 2011-03-26 VITALS — BP 184/72 | HR 69 | Resp 16 | Ht 62.0 in | Wt 128.8 lb

## 2011-03-26 DIAGNOSIS — I739 Peripheral vascular disease, unspecified: Secondary | ICD-10-CM

## 2011-03-26 DIAGNOSIS — I6529 Occlusion and stenosis of unspecified carotid artery: Secondary | ICD-10-CM

## 2011-03-26 DIAGNOSIS — I70219 Atherosclerosis of native arteries of extremities with intermittent claudication, unspecified extremity: Secondary | ICD-10-CM | POA: Insufficient documentation

## 2011-03-26 DIAGNOSIS — Z48812 Encounter for surgical aftercare following surgery on the circulatory system: Secondary | ICD-10-CM

## 2011-03-26 DIAGNOSIS — I779 Disorder of arteries and arterioles, unspecified: Secondary | ICD-10-CM

## 2011-03-26 NOTE — Progress Notes (Signed)
[facility]  Patient name: Regina Hamilton MRN: [id number] DOB: 06-06-1923 Sex: female  REASON FOR VISIT: follow up of peripheral vascular disease.  HPI: Regina Hamilton is a 78 y.o. female who I been following with peripheral vascular disease. Ulcer has a known 60-79% left carotid stenosis.  She complains of pain in both legs which occurs with activity but also occurs with simply standing. She's had a previous left femoral to above-knee pop bypass with a vein graft in 2002. She does describe some claudication in both calves associated with ambulation and relieved with rest. I do not get any history of thigh are hip claudication. She states she has aching pain in her legs which is aggravated by standing and walking. Elevation and relieved her symptoms somewhat but not completely. She states that compression stockings actually made her symptoms worse. She also has some low back pain. She's had previous hip replacements.  Past Medical History  Diagnosis Date  . Peripheral vascular disease   . Carotid artery occlusion   . Hypertension   . Myocardial infarction   . Hyperlipidemia   . Arthritis   . Pacemaker 2008    [facility] in [place], [place]    History reviewed. No pertinent family history.  SOCIAL HISTORY: History  Substance Use Topics  . Smoking status: Never Smoker   . Smokeless tobacco: Not on file  . Alcohol Use: No    Allergies  Allergen Reactions  . Cardizem   . Diltiazem Hcl   . Iron     REACTION: RASH  . Midazolam     REACTION: hallucinations per daughter  . Mobic   . Moxifloxacin     REACTION: Tongue swelling; mouth ulcers; confusion  . Warfarin Sodium     Current Outpatient Prescriptions  Medication Sig Dispense Refill  . Aliskiren Fumarate (TEKTURNA PO) Take by mouth daily.      Marland Kitchen ALPRAZolam (XANAX) 0.25 MG tablet Take 0.25 mg by mouth at bedtime as needed. Takes 1/2 tablet at bedtime      . AmLODIPine Besylate (NORVASC  PO) Take by mouth daily.      . Ascorbic Acid (VITAMIN C ER PO) Take by mouth daily.      Marland Kitchen aspirin 325 MG tablet Take 325 mg by mouth daily.      Marland Kitchen CALCIUM & MAGNESIUM CARBONATES PO Take by mouth daily.      . cholecalciferol (VITAMIN D) 1000 UNITS tablet Take 1,000 Units by mouth daily.      Marland Kitchen HYDROCHLOROTHIAZIDE PO Take by mouth daily.      . Valsartan (DIOVAN PO) Take by mouth daily.        REVIEW OF SYSTEMS: Arly.Keller ] denotes positive finding; [  ] denotes negative finding  CARDIOVASCULAR:  [ ]  chest pain   [ ]  chest pressure   [ ]  palpitations   [ ]  orthopnea   Arly.Keller ] dyspnea on exertion   Arly.Keller ] claudication   [ ]  rest pain   [ ]  DVT   [ ]  phlebitis PULMONARY:   [ ]  productive cough   [ ]  asthma   [ ]  wheezing NEUROLOGIC:   [ ]  weakness  [ ]  paresthesias  [ ]  aphasia  [ ]  amaurosis  [ ]  dizziness HEMATOLOGIC:   [ ]  bleeding problems   [ ]  clotting disorders MUSCULOSKELETAL:  Arly.Keller ] joint pain   [ ]  joint swelling Arly.Keller ] leg swelling GASTROINTESTINAL: [ ]   blood  in stool  [ ]   hematemesis GENITOURINARY:  [ ]   dysuria  [ ]   hematuria PSYCHIATRIC:  [ ]  history of major depression INTEGUMENTARY:  [ ]  rashes  [ ]  ulcers CONSTITUTIONAL:  [ ]  fever   [ ]  chills  PHYSICAL EXAM: Filed Vitals:   03/26/11 1429  BP: 184/72  Pulse: 69  Resp: 16  Height: 5\' 2"  (1.575 m)  Weight: 128 lb 12.8 oz (58.423 kg)   Body mass index is 23.56 kg/(m^2). GENERAL: The patient is a well-nourished female, in no acute distress. The vital signs are documented above. CARDIOVASCULAR: There is a regular rate and rhythm without significant murmur appreciated. I do not detect carotid bruits. She has palpable femoral pulses. I cannot palpate popliteal or pedal pulses. She has mild bilateral lower extremity swelling. PULMONARY: There is good air exchange bilaterally without wheezing or rales. ABDOMEN: Soft and non-tender with normal pitched bowel sounds.  MUSCULOSKELETAL: There are no major deformities or  cyanosis. NEUROLOGIC: No focal weakness or paresthesias are detected. SKIN: There are no ulcers or rashes noted. PSYCHIATRIC: The patient has a normal affect.  DATA:  I have independently interpreted her Doppler study. She has monophasic Doppler signals in both feet. On the right side the arteries were noncompressible so ABIs could not be obtained. However her toe pressures 103 mmHg would suggest adequate perfusion. On the left side ABI is 99% although this may be falsely elevated.  Toe pressure on the left is 72 mmHg. In addition I have independently interpreted the duplex of her graft which shows that her left femoropopliteal bypass graft is patent with no areas of increased callosity within the graft to suggest the stenosis.  MEDICAL ISSUES:  CAROTID ARTERY STENOSIS, BILATERAL This patient has a known 60-79% (low end of arranged) left carotid stenosis. She is due for a carotid duplex scan in 6 months which I have ordered. She understands we would not consider left carotid endarterectomy unless she became symptomatic or the stenosis progressed to greater than 80%.  Atherosclerosis of native arteries of the extremities with intermittent claudication This patient has bilateral lower extremity claudication which is slightly more significant on the left side. She has monophasic Doppler signals in both feet. Her toe pressures however suggest that she does have adequate circulation. She has no rest pain or nonhealing ulcers. I simply encouraged her to stay as active as possible. Fortunately she is not a smoker. She's having a lot of pain in both legs which I cannot attribute to her peripheral vascular disease. A lot of these symptoms occur simply with standing and minimal activity.    DICKSON,CHRISTOPHER S [facility] Beeper: [phone]

## 2011-03-26 NOTE — Assessment & Plan Note (Signed)
This patient has bilateral lower extremity claudication which is slightly more significant on the left side. She has monophasic Doppler signals in both feet. Her toe pressures however suggest that she does have adequate circulation. She has no rest pain or nonhealing ulcers. I simply encouraged her to stay as active as possible. Fortunately she is not a smoker. She's having a lot of pain in both legs which I cannot attribute to her peripheral vascular disease. A lot of these symptoms occur simply with standing and minimal activity.

## 2011-03-26 NOTE — Assessment & Plan Note (Signed)
This patient has a known 60-79% (low end of arranged) left carotid stenosis. She is due for a carotid duplex scan in 6 months which I have ordered. She understands we would not consider left carotid endarterectomy unless she became symptomatic or the stenosis progressed to greater than 80%.

## 2011-04-02 NOTE — Procedures (Unsigned)
BYPASS GRAFT EVALUATION  INDICATION:  Followup left femoral to popliteal bypass graft  HISTORY: Diabetes:  no Cardiac:  Pacemaker Hypertension:  yes Smoking:  no Previous Surgery:  Left femoral to popliteal bypass graft 06/30/2000 performed by Dr. Edilia Bo.  SINGLE LEVEL ARTERIAL EXAM                              RIGHT              LEFT Brachial:                    139                137 Anterior tibial:             noncompressible    137 Posterior tibial:            noncompressible    131 Peroneal: Ankle/brachial index:        Noncompressible    0.99.  TBI:  Right = 0.74  Left = 0.52  PREVIOUS ABI:  Date: 03/20/2010  RIGHT: 1.42  LEFT:  0.76  LOWER EXTREMITY BYPASS GRAFT DUPLEX EXAM:  DUPLEX:  Biphasic wave forms noted throughout. Patent left femoral to popliteal bypass graft.  Possible vein graft degeneration at the distal anastomosis with dilatation noted measuring approximately 1.41 cm transverse.  IMPRESSION:  Patent left femoral to popliteal bypass graft with possible vein graft degeneration as stated above.  Calcified vessels present.  Bilateral toe brachial indices suggest normal perfusion.  ___________________________________________ Di Kindle. Edilia Bo, M.D.  EM/MEDQ  D:  03/27/2011  T:  03/27/2011  Job:  [id number]

## 2011-04-24 ENCOUNTER — Other Ambulatory Visit (HOSPITAL_COMMUNITY): Payer: Self-pay | Admitting: *Deleted

## 2011-04-24 DIAGNOSIS — R1901 Right upper quadrant abdominal swelling, mass and lump: Secondary | ICD-10-CM

## 2011-05-02 ENCOUNTER — Encounter (HOSPITAL_COMMUNITY)
Admission: RE | Admit: 2011-05-02 | Discharge: 2011-05-02 | Disposition: A | Discharge status: Home / Self Care | Payer: Medicare Other | Source: Ambulatory Visit | Attending: Internal Medicine | Admitting: Internal Medicine

## 2011-05-02 ENCOUNTER — Other Ambulatory Visit: Payer: Self-pay

## 2011-05-02 DIAGNOSIS — R1901 Right upper quadrant abdominal swelling, mass and lump: Secondary | ICD-10-CM | POA: Insufficient documentation

## 2011-05-02 DIAGNOSIS — R1011 Right upper quadrant pain: Secondary | ICD-10-CM | POA: Insufficient documentation

## 2011-05-02 MED ORDER — SINCALIDE 5 MCG IJ SOLR: Freq: Once | INTRAMUSCULAR | Status: DC

## 2011-05-02 MED ORDER — TECHNETIUM TC 99M MEBROFENIN IV KIT
5.5000 | PACK | Freq: Once | INTRAVENOUS | Status: AC | PRN
  Administered 2011-05-02: 6 via INTRAVENOUS

## 2011-05-05 ENCOUNTER — Other Ambulatory Visit (HOSPITAL_COMMUNITY): Payer: Medicare Other

## 2011-05-08 ENCOUNTER — Ambulatory Visit (INDEPENDENT_AMBULATORY_CARE_PROVIDER_SITE_OTHER): Payer: Medicare Other | Admitting: Physician Assistant

## 2011-05-08 ENCOUNTER — Encounter: Payer: Self-pay | Admitting: Physician Assistant

## 2011-05-08 VITALS — BP 143/62 | HR 70 | Ht 63.0 in | Wt 125.0 lb

## 2011-05-08 DIAGNOSIS — K8020 Calculus of gallbladder without cholecystitis without obstruction: Secondary | ICD-10-CM | POA: Insufficient documentation

## 2011-05-08 DIAGNOSIS — I4891 Unspecified atrial fibrillation: Secondary | ICD-10-CM

## 2011-05-08 DIAGNOSIS — I10 Essential (primary) hypertension: Secondary | ICD-10-CM

## 2011-05-08 DIAGNOSIS — I6529 Occlusion and stenosis of unspecified carotid artery: Secondary | ICD-10-CM

## 2011-05-08 NOTE — Progress Notes (Signed)
[address] [place], [place]  [address] Phone: [phone] Fax:  [phone]  Date:  05/08/2011   Name:  Regina Hamilton       DOB:  [birth year] MRN:  [id number]  PCP:  Dr. Debby Bud (in [place]);  Dr. Sherrell Puller (in [place]) Primary Cardiologist: Dr. Sherryl Manges  Primary Electrophysiologist:  Dr. Sherryl Manges (in [place]); Dr. Orene Desanctis (in [place], [place])   History of Present Illness: Regina Hamilton is a 78 y.o. female who presents for surgical clearance.    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 Attempts to initiate therapy with Coumadin concurrently with Cardizem resulted in a rash and the discontinuation of both drugs. She has carotid disease followed by Dr. Edilia Bo. Her last doppler u/s 03/2011 demonstrated 1-39% RICA and 60-79% LICA stenosis. She has PAD and had a left fem-pop bypass in the past. Her last echo was in 10/2006 and demonstrated an EF 55%; mild MR and mild LAE. A myoview was done in 2008 and was normal.  She has not been able to tolerate Apixaban which was prescribed to her by her cardiologist in [place] due to frequent bleeding.  I saw her a year ago for surgical clearance for left THR.  She typically has her device interrogated with Dr. Jorge Ny in [place] and it was last checked in 03/2011.  She is doing well.  She remains very active.  She can no longer ride a bike due to her hip problems.  However, she walks daily.  She cleans all of her house.  She does yard work and Architectural technologist.  She never has to stop due to shortness of breath.  She denies any chest pain.  She is eager to start mowing her lawn.  She denies orthopnea, PND or significant edema.  She denies syncope.  Past Medical History  Diagnosis Date  . Peripheral vascular disease   . Carotid artery occlusion   . Hypertension   . Myocardial infarction   . Hyperlipidemia   . Arthritis   . Pacemaker 2008     [facility] [place] in [place], [place]    Current Outpatient Prescriptions  Medication Sig Dispense Refill  . Aliskiren Fumarate (TEKTURNA PO) Take by mouth daily.      Marland Kitchen ALPRAZolam (XANAX) 0.25 MG tablet Take 0.25 mg by mouth at bedtime as needed. Takes 1/2 tablet at bedtime      . AmLODIPine Besylate (NORVASC PO) Take 10 mg by mouth daily.       . Ascorbic Acid (VITAMIN C ER PO) Take 500 mg by mouth daily.       Marland Kitchen aspirin 325 MG tablet Take 325 mg by mouth daily.      Marland Kitchen CALCIUM & MAGNESIUM CARBONATES PO Take by mouth daily.      . Cholecalciferol (VITAMIN D3) 2000 UNITS capsule Take 2,000 Units by mouth daily.      Marland Kitchen HYDROCHLOROTHIAZIDE PO Take 12.5 mg by mouth daily.       Marland Kitchen LODOXAMIDE TROMETHAMINE OP Apply 1 drop to eye daily.      . Valsartan (DIOVAN PO) Take 160 mg by mouth daily.        No current facility-administered medications for this visit.   Facility-Administered Medications Ordered in Other Visits  Medication Dose Route Frequency Provider Last Rate Last Dose  . DISCONTD: sincalide Marcene Brawn) injection   Intravenous Once Medication Radiologist, MD  Allergies: Allergies  Allergen Reactions  . Cardizem   . Diltiazem Hcl   . Iron     REACTION: RASH  . Midazolam     REACTION: hallucinations per daughter  . Mobic   . Moxifloxacin     REACTION: Tongue swelling; mouth ulcers; confusion  . Warfarin Sodium     History  Substance Use Topics  . Smoking status: Never Smoker   . Smokeless tobacco: Not on file  . Alcohol Use: No     ROS:  Please see the history of present illness.   All other systems reviewed and negative.   PHYSICAL EXAM: VS:  BP 143/62  Pulse 70  Ht 5\' 3"  (1.6 m)  Wt 125 lb (56.7 kg)  BMI 22.14 kg/m2 Well nourished, well developed, in no acute distress HEENT: normal Neck: no JVD Cardiac:  normal S1, S2; RRR; no murmur Lungs:  clear to auscultation bilaterally, no wheezing, rhonchi or rales Abd: soft, + RUQ tenderness to  palpation Ext: no edema Skin: warm and dry Neuro:  CNs 2-12 intact, no focal abnormalities noted  EKG:  Underlying AFib, V paced, HR 70  ASSESSMENT AND PLAN:  1. Atrial fibrillation  As noted, she has been intolerant to multiple anticoagulants and only takes ASA.  She is s/p AV nodal ablation and pacer implant.  She continues to have her pacer checked in [place] with Dr. Jorge Ny.  Her surgeon will need to get the rep for her pacer to be available during surgery to monitor her device.     2. Cholelithiasis  She is very active without cardiovascular limitations.  The patient does not have any unstable cardiac conditions.  The patient can achieve 4 METs or greater without anginal symptoms.  According to [facility] and AHA guidelines, the patient requires no further cardiac workup prior to their noncardiac surgery.  The patient should be at acceptable risk.     3. HYPERTENSION  Somewhat elevated.  Monitor for now.   4. CAROTID ARTERY STENOSIS, BILATERAL  Followed by Dr. Edilia Bo.  Most recent dopplers stable.       Luna Glasgow, PA-C  12:59 PM 05/08/2011

## 2011-05-20 HISTORY — PX: CHOLECYSTECTOMY: SHX55

## 2011-09-03 ENCOUNTER — Other Ambulatory Visit: Payer: Medicare Other

## 2011-09-03 ENCOUNTER — Ambulatory Visit: Payer: Medicare Other | Admitting: Neurosurgery

## 2011-09-09 ENCOUNTER — Encounter: Payer: Self-pay | Admitting: Neurosurgery

## 2011-09-10 ENCOUNTER — Ambulatory Visit (INDEPENDENT_AMBULATORY_CARE_PROVIDER_SITE_OTHER): Payer: Medicare Other | Admitting: Neurosurgery

## 2011-09-10 ENCOUNTER — Ambulatory Visit (INDEPENDENT_AMBULATORY_CARE_PROVIDER_SITE_OTHER): Payer: Medicare Other | Admitting: Vascular Surgery

## 2011-09-10 ENCOUNTER — Encounter: Payer: Self-pay | Admitting: Neurosurgery

## 2011-09-10 VITALS — BP 146/70 | HR 70 | Resp 18 | Ht 64.0 in | Wt 127.2 lb

## 2011-09-10 DIAGNOSIS — I739 Peripheral vascular disease, unspecified: Secondary | ICD-10-CM

## 2011-09-10 DIAGNOSIS — I779 Disorder of arteries and arterioles, unspecified: Secondary | ICD-10-CM

## 2011-09-10 DIAGNOSIS — I6529 Occlusion and stenosis of unspecified carotid artery: Secondary | ICD-10-CM

## 2011-09-10 DIAGNOSIS — Z48812 Encounter for surgical aftercare following surgery on the circulatory system: Secondary | ICD-10-CM

## 2011-09-10 DIAGNOSIS — I70219 Atherosclerosis of native arteries of extremities with intermittent claudication, unspecified extremity: Secondary | ICD-10-CM

## 2011-09-10 NOTE — Progress Notes (Signed)
ABI performed @ [facility] 09/10/2011

## 2011-09-10 NOTE — Progress Notes (Signed)
[facility] [place] Office Note  CC: Six-month lower extremity ABI and carotid study Referring Physician: Edilia Bo  History of Present Illness: 78 year old female patient of Dr. Edilia Bo who status post a left femoropopliteal bypass graft May of 2002. The patient's also followed for chronic stenosis. The patient and her daughter deny any signs or symptoms of CVA, TIA, amaurosis fugax or any neural deficit. The patient does complain of increasing right lower extremity claudication with ambulation. The patient and her daughter state that at 43 years old she is very active and likes to walk and has not been able to do so due to increasing pain in the right lower extremity.  Past Medical History  Diagnosis Date  . Peripheral vascular disease   . Carotid artery occlusion   . Hypertension   . Myocardial infarction   . Hyperlipidemia   . Arthritis   . Pacemaker 2008    [facility] in [place], [place]    ROS: [x]  Positive   [ ]  Denies    General: [ ]  Weight loss, [ ]  Fever, [ ]  chills Neurologic: [ ]  Dizziness, [ ]  Blackouts, [ ]  Seizure [ ]  Stroke, [ ]  "Mini stroke", [ ]  Slurred speech, [ ]  Temporary blindness; [ ]  weakness in arms or legs, [ ]  Hoarseness Cardiac: [ ]  Chest pain/pressure, [ ]  Shortness of breath at rest [ ]  Shortness of breath with exertion, [ ]  Atrial fibrillation or irregular heartbeat Vascular: [ ]  Pain in legs with walking, [ ]  Pain in legs at rest, [ ]  Pain in legs at night,  [ ]  Non-healing ulcer, [ ]  Blood clot in vein/DVT,   Pulmonary: [ ]  Home oxygen, [ ]  Productive cough, [ ]  Coughing up blood, [ ]  Asthma,  [ ]  Wheezing Musculoskeletal:  [ ]  Arthritis, [ ]  Low back pain, [ ]  Joint pain Hematologic: [ ]  Easy Bruising, [ ]  Anemia; [ ]  Hepatitis Gastrointestinal: [ ]  Blood in stool, [ ]  Gastroesophageal Reflux/heartburn, [ ]  Trouble swallowing Urinary: [ ]  chronic Kidney disease, [ ]  on HD - [ ]  MWF or [ ]  TTHS, [ ]  Burning with  urination, [ ]  Difficulty urinating Skin: [ ]  Rashes, [ ]  Wounds Psychological: [ ]  Anxiety, [ ]  Depression   Social History History  Substance Use Topics  . Smoking status: Never Smoker   . Smokeless tobacco: Not on file  . Alcohol Use: No    Family History Family History  Problem Relation Age of Onset  . Heart disease Mother     Heart disease before age 56  . Heart disease Father     Heat Diease before age 63  . Heart disease Maternal Grandmother   . Heart disease Paternal Grandmother     Allergies  Allergen Reactions  . Alphagan P (Brimonidine Tartrate) Photosensitivity    Redness bilateral eye  . Diltiazem Hcl   . Diltiazem Hcl   . Iron     REACTION: RASH  . Meloxicam   . Midazolam     REACTION: hallucinations per daughter  . Moxifloxacin     REACTION: Tongue swelling; mouth ulcers; confusion  . Warfarin Sodium     Current Outpatient Prescriptions  Medication Sig Dispense Refill  . Aliskiren Fumarate (TEKTURNA PO) Take by mouth daily.      Marland Kitchen ALPRAZolam (XANAX) 0.25 MG tablet Take 0.25 mg by mouth at bedtime as needed. Takes 1/2 tablet at bedtime      . AmLODIPine Besylate (NORVASC PO)  Take 10 mg by mouth daily.       . Ascorbic Acid (VITAMIN C ER PO) Take 500 mg by mouth daily.       Marland Kitchen aspirin 325 MG tablet Take 325 mg by mouth daily.      Marland Kitchen CALCIUM & MAGNESIUM CARBONATES PO Take by mouth daily.      . Cholecalciferol (VITAMIN D3) 2000 UNITS capsule Take 2,000 Units by mouth daily.      . dorzolamide (TRUSOPT) 2 % ophthalmic solution Place 1 drop into both eyes 2 (two) times daily.      Marland Kitchen HYDROCHLOROTHIAZIDE PO Take 12.5 mg by mouth daily.       Marland Kitchen loteprednol (LOTEMAX) 0.5 % ophthalmic suspension Place 1 drop into both eyes daily.      Marland Kitchen omeprazole (PRILOSEC) 10 MG capsule Take 10 mg by mouth daily.      . Valsartan (DIOVAN PO) Take 160 mg by mouth daily.       Marland Kitchen LODOXAMIDE TROMETHAMINE OP Apply 1 drop to eye daily.        Physical Examination  Filed  Vitals:   09/10/11 1537  BP: 146/70  Pulse: 70  Resp:     Body mass index is 21.83 kg/(m^2).  General:  WDWN in NAD Gait: Normal HEENT: WNL Eyes: Pupils equal Pulmonary: normal non-labored breathing , without Rales, rhonchi,  wheezing Cardiac: RRR, without  Murmurs, rubs or gallops; No carotid bruits Abdomen: soft, NT, no masses Skin: no rashes, ulcers noted Vascular Exam/Pulses: Palpable femoral pulses bilaterally, 2+ radial pulses bilaterally, carotid pulses to auscultation mild bruit heard on the left  Extremities without ischemic changes, no Gangrene , no cellulitis; no open wounds;  Musculoskeletal: no muscle wasting or atrophy  Neurologic: A&O X 3; Appropriate Affect ; SENSATION: normal; MOTOR FUNCTION:  moving all extremities equally. Speech is fluent/normal  Non-Invasive Vascular Imaging: Carotid duplex exam today shows right stenosis in the 1-39% range, left side stenosis and 60-79% range          ABIs today were 0.66 and monophasic on the right, 0.83 and monophasic to biphasic on the left with toe brachial index of 0.16 and critical ischemia range on the right and 0.47 on the left. Dr. Edilia Bo did review these studies and spoke to the patient and her daughter.  ASSESSMENT/PLAN: Patient with moderate carotid stenosis on the left that is asymptomatic we will follow this every 6 months with carotid duplex, increased right lower extremity claudication. Dr. Edilia Bo spoke with the patient who agrees that she will undergo a right lower extremity arteriogram with possible intervention on 09/22/2011. The patient's followup will be pending that procedure. The patient and her daughter are in agreement with this, their questions were encouraged and answered.  Lauree Chandler ANP  Clinic M.D.: Edilia Bo

## 2011-09-10 NOTE — Progress Notes (Signed)
carotid duplex performed @ [facility] 09/10/2011

## 2011-09-11 ENCOUNTER — Other Ambulatory Visit: Payer: Self-pay

## 2011-09-12 ENCOUNTER — Encounter (HOSPITAL_COMMUNITY): Payer: Self-pay | Admitting: Pharmacy Technician

## 2011-09-17 NOTE — Procedures (Unsigned)
CAROTID DUPLEX EXAM  INDICATION:  Carotid stenosis.  HISTORY: Diabetes:  No Cardiac:  Pacemaker Hypertension:  Yes Smoking:  No Previous Surgery:  No carotid intervention CV History:  Currently asymptomatic Amaurosis Fugax No, Paresthesias No, Hemiparesis No                                      RIGHT             LEFT Brachial systolic pressure:         150               161 Brachial Doppler waveforms:         WNL               WNL Vertebral direction of flow:        Antegrade         Antegrade DUPLEX VELOCITIES (cm/sec) CCA peak systolic                   78                80 ECA peak systolic                   105               215 ICA peak systolic                   56                259-P/109-M ICA end diastolic                   11                30-P/20-M PLAQUE MORPHOLOGY:                  Heterogeneous     Heterogeneous PLAQUE AMOUNT:                      Mild              Moderate to severe PLAQUE LOCATION:                    CCA/ICA           CCA/ICA/ECA  IMPRESSION: 1. Right internal carotid artery stenosis present in the 1 to 39%     range. 2. Right external carotid artery appears patent. 3. Left external carotid artery stenosis present. 4. Left internal carotid artery stenosis present in the 60 to 79%     range by peak systolic velocity and ratio of 3.2, consistent with     previous exams, velocities have essentially remained unchanged     since 2007. 5. Bilateral vertebral arteries are patent and antegrade.  ___________________________________________ Di Kindle. Edilia Bo, M.D.  SH/MEDQ  D:  09/10/2011  T:  09/10/2011  Job:  [id number]

## 2011-09-22 ENCOUNTER — Ambulatory Visit (HOSPITAL_COMMUNITY)
Admission: RE | Admit: 2011-09-22 | Discharge: 2011-09-22 | Disposition: A | Discharge status: Home / Self Care | Payer: Medicare Other | Source: Ambulatory Visit | Attending: Vascular Surgery | Admitting: Vascular Surgery

## 2011-09-22 ENCOUNTER — Encounter (HOSPITAL_COMMUNITY)
Admission: RE | Disposition: A | Discharge status: Home / Self Care | Payer: Self-pay | Source: Ambulatory Visit | Attending: Vascular Surgery

## 2011-09-22 ENCOUNTER — Telehealth: Payer: Self-pay | Admitting: Vascular Surgery

## 2011-09-22 DIAGNOSIS — I6529 Occlusion and stenosis of unspecified carotid artery: Secondary | ICD-10-CM | POA: Insufficient documentation

## 2011-09-22 DIAGNOSIS — I70209 Unspecified atherosclerosis of native arteries of extremities, unspecified extremity: Secondary | ICD-10-CM | POA: Insufficient documentation

## 2011-09-22 DIAGNOSIS — Z950 Presence of cardiac pacemaker: Secondary | ICD-10-CM | POA: Insufficient documentation

## 2011-09-22 DIAGNOSIS — I252 Old myocardial infarction: Secondary | ICD-10-CM | POA: Insufficient documentation

## 2011-09-22 DIAGNOSIS — E785 Hyperlipidemia, unspecified: Secondary | ICD-10-CM | POA: Insufficient documentation

## 2011-09-22 DIAGNOSIS — I10 Essential (primary) hypertension: Secondary | ICD-10-CM | POA: Insufficient documentation

## 2011-09-22 DIAGNOSIS — I70219 Atherosclerosis of native arteries of extremities with intermittent claudication, unspecified extremity: Secondary | ICD-10-CM

## 2011-09-22 HISTORY — PX: LOWER EXTREMITY ANGIOGRAM: SHX5508

## 2011-09-22 LAB — POCT I-STAT, CHEM 8
Chloride: 101 mEq/L (ref 96–112)
Creatinine, Ser: 1.2 mg/dL — ABNORMAL HIGH (ref 0.50–1.10)
Hemoglobin: 13.3 g/dL (ref 12.0–15.0)
Potassium: 3.6 mEq/L (ref 3.5–5.1)
Sodium: 137 mEq/L (ref 135–145)

## 2011-09-22 SURGERY — ANGIOGRAM, LOWER EXTREMITY
Anesthesia: LOCAL | Laterality: Right

## 2011-09-22 MED ORDER — ONDANSETRON HCL 4 MG/2ML IJ SOLN: 4.0000 mg | Freq: Four times a day (QID) | INTRAMUSCULAR | Status: DC | PRN

## 2011-09-22 MED ORDER — ACETAMINOPHEN 325 MG PO TABS: 650.0000 mg | ORAL_TABLET | ORAL | Status: DC | PRN

## 2011-09-22 MED ORDER — SODIUM CHLORIDE 0.9 % IV SOLN: INTRAVENOUS | Status: DC

## 2011-09-22 MED ORDER — SODIUM CHLORIDE 0.9 % IV SOLN: 1.0000 mL/kg/h | INTRAVENOUS | Status: DC

## 2011-09-22 MED ORDER — HEPARIN (PORCINE) IN NACL 2-0.9 UNIT/ML-% IJ SOLN
INTRAMUSCULAR | Status: AC
  Filled 2011-09-22: qty 1000

## 2011-09-22 MED ORDER — LIDOCAINE HCL (PF) 1 % IJ SOLN
INTRAMUSCULAR | Status: AC
  Filled 2011-09-22: qty 30

## 2011-09-22 NOTE — Telephone Encounter (Signed)
Message copied by Sara Chu on Mon Sep 22, 2011  3:50 PM ------      Message from: Phillips Odor      Created: Mon Sep 22, 2011 12:50 PM      Regarding: FW: charge and follow up                   ----- Message -----         From: Chuck Hint, MD         Sent: 09/22/2011  11:53 AM           To: Reuel Derby, Melene Plan, RN      Subject: charge and follow up                                     PROCEDURE:        1. ULTRASOUND-GUIDED ACCESS TO THE RIGHT COMMON FEMORAL ARTERY      2. Aortogram with bilateral iliac arteriogram and bilateral lower extremity runoff                  SURGEON: Di Kindle. Edilia Bo, MD, FACS            She needs a follow up in 4-6 weeks. Thank you. CSD

## 2011-09-22 NOTE — Interval H&P Note (Signed)
History and Physical Interval Note:  09/22/2011 11:16 AM  Regina Hamilton  has presented today for surgery, with the diagnosis of right lower extremity claudication  The various methods of treatment have been discussed with the patient and family. After consideration of risks, benefits and other options for treatment, the patient has consented to: LOWER EXTREMITY ANGIOGRAM (Right) AND POSSIBLE ANGIOPLASTY AND STENTING. The patient's history has been reviewed, patient examined, no change in status, stable for surgery.  I have reviewed the patient's chart and labs.  Questions were answered to the patient's satisfaction.     [name]

## 2011-09-22 NOTE — H&P (View-Only) (Signed)
[facility] [facility] Office Note  CC: Six-month lower extremity ABI and carotid study Referring Physician: Dickson  History of Present Illness: 78-year-old female patient of Dr. Dickson who status post a left femoropopliteal bypass graft May of 2002. The patient's also followed for chronic stenosis. The patient and her daughter deny any signs or symptoms of CVA, TIA, amaurosis fugax or any neural deficit. The patient does complain of increasing right lower extremity claudication with ambulation. The patient and her daughter state that at 78 years old she is very active and likes to walk and has not been able to do so due to increasing pain in the right lower extremity.  Past Medical History  Diagnosis Date  . Peripheral vascular disease   . Carotid artery occlusion   . Hypertension   . Myocardial infarction   . Hyperlipidemia   . Arthritis   . Pacemaker 2008    [facility] in [place], [place]    ROS: [x] Positive   [ ] Denies    General: [ ] Weight loss, [ ] Fever, [ ] chills Neurologic: [ ] Dizziness, [ ] Blackouts, [ ] Seizure [ ] Stroke, [ ] "Mini stroke", [ ] Slurred speech, [ ] Temporary blindness; [ ] weakness in arms or legs, [ ] Hoarseness Cardiac: [ ] Chest pain/pressure, [ ] Shortness of breath at rest [ ] Shortness of breath with exertion, [ ] Atrial fibrillation or irregular heartbeat Vascular: [ ] Pain in legs with walking, [ ] Pain in legs at rest, [ ] Pain in legs at night,  [ ] Non-healing ulcer, [ ] Blood clot in vein/DVT,   Pulmonary: [ ] Home oxygen, [ ] Productive cough, [ ] Coughing up blood, [ ] Asthma,  [ ] Wheezing Musculoskeletal:  [ ] Arthritis, [ ] Low back pain, [ ] Joint pain Hematologic: [ ] Easy Bruising, [ ] Anemia; [ ] Hepatitis Gastrointestinal: [ ] Blood in stool, [ ] Gastroesophageal Reflux/heartburn, [ ] Trouble swallowing Urinary: [ ] chronic Kidney disease, [ ] on HD - [ ] MWF or [ ] TTHS, [ ] Burning with  urination, [ ] Difficulty urinating Skin: [ ] Rashes, [ ] Wounds Psychological: [ ] Anxiety, [ ] Depression   Social History History  Substance Use Topics  . Smoking status: Never Smoker   . Smokeless tobacco: Not on file  . Alcohol Use: No    Family History Family History  Problem Relation Age of Onset  . Heart disease Mother     Heart disease before age 60  . Heart disease Father     Heat Diease before age 60  . Heart disease Maternal Grandmother   . Heart disease Paternal Grandmother     Allergies  Allergen Reactions  . Alphagan P (Brimonidine Tartrate) Photosensitivity    Redness bilateral eye  . Diltiazem Hcl   . Diltiazem Hcl   . Iron     REACTION: RASH  . Meloxicam   . Midazolam     REACTION: hallucinations per daughter  . Moxifloxacin     REACTION: Tongue swelling; mouth ulcers; confusion  . Warfarin Sodium     Current Outpatient Prescriptions  Medication Sig Dispense Refill  . Aliskiren Fumarate (TEKTURNA PO) Take by mouth daily.      . ALPRAZolam (XANAX) 0.25 MG tablet Take 0.25 mg by mouth at bedtime as needed. Takes 1/2 tablet at bedtime      . AmLODIPine Besylate (NORVASC PO)   Take 10 mg by mouth daily.       . Ascorbic Acid (VITAMIN C ER PO) Take 500 mg by mouth daily.       . aspirin 325 MG tablet Take 325 mg by mouth daily.      . CALCIUM & MAGNESIUM CARBONATES PO Take by mouth daily.      . Cholecalciferol (VITAMIN D3) 2000 UNITS capsule Take 2,000 Units by mouth daily.      . dorzolamide (TRUSOPT) 2 % ophthalmic solution Place 1 drop into both eyes 2 (two) times daily.      . HYDROCHLOROTHIAZIDE PO Take 12.5 mg by mouth daily.       . loteprednol (LOTEMAX) 0.5 % ophthalmic suspension Place 1 drop into both eyes daily.      . omeprazole (PRILOSEC) 10 MG capsule Take 10 mg by mouth daily.      . Valsartan (DIOVAN PO) Take 160 mg by mouth daily.       . LODOXAMIDE TROMETHAMINE OP Apply 1 drop to eye daily.        Physical Examination  Filed  Vitals:   09/10/11 1537  BP: 146/70  Pulse: 70  Resp:     Body mass index is 21.83 kg/(m^2).  General:  WDWN in NAD Gait: Normal HEENT: WNL Eyes: Pupils equal Pulmonary: normal non-labored breathing , without Rales, rhonchi,  wheezing Cardiac: RRR, without  Murmurs, rubs or gallops; No carotid bruits Abdomen: soft, NT, no masses Skin: no rashes, ulcers noted Vascular Exam/Pulses: Palpable femoral pulses bilaterally, 2+ radial pulses bilaterally, carotid pulses to auscultation mild bruit heard on the left  Extremities without ischemic changes, no Gangrene , no cellulitis; no open wounds;  Musculoskeletal: no muscle wasting or atrophy  Neurologic: A&O X 3; Appropriate Affect ; SENSATION: normal; MOTOR FUNCTION:  moving all extremities equally. Speech is fluent/normal  Non-Invasive Vascular Imaging: Carotid duplex exam today shows right stenosis in the 1-39% range, left side stenosis and 60-79% range          ABIs today were 0.66 and monophasic on the right, 0.83 and monophasic to biphasic on the left with toe brachial index of 0.16 and critical ischemia range on the right and 0.47 on the left. Dr. Dickson did review these studies and spoke to the patient and her daughter.  ASSESSMENT/PLAN: Patient with moderate carotid stenosis on the left that is asymptomatic we will follow this every 6 months with carotid duplex, increased right lower extremity claudication. Dr. Dickson spoke with the patient who agrees that she will undergo a right lower extremity arteriogram with possible intervention on 09/22/2011. The patient's followup will be pending that procedure. The patient and her daughter are in agreement with this, their questions were encouraged and answered.  Lynise Porr ANP  Clinic M.D.: Dickson  

## 2011-09-22 NOTE — Op Note (Signed)
PATIENT: Regina Hamilton   MRN: [id number] DOB: 11/23/1923    DATE OF PROCEDURE: 09/22/2011  INDICATIONS: [name] is a 78 y.o. female with progressive ischemia of the right lower extremity.  PROCEDURE:  1. ULTRASOUND-GUIDED ACCESS TO THE RIGHT COMMON FEMORAL ARTERY 2. Aortogram with bilateral iliac arteriogram and bilateral lower extremity runoff  SURGEON: Di Kindle. Edilia Bo, MD, FACS  ANESTHESIA: local   EBL: minimal  TECHNIQUE: The patient was brought to the peripheral vascular lab both groins were prepped and draped in usual sterile fashion. After the skin was infiltrated with 1% lidocaine, and under ultrasound guidance, the right common femoral artery was cannulated and a guidewire introduced into the infrarenal aorta under fluoroscopic control. A 5 French sheath was introduced over the wire. A pigtail catheter was positioned at the L1 vertebral and flush aortogram obtained. The catheter was positioned at the aortic bifurcation and oblique iliac projection was obtained. Bilateral lower extremity runoff films were pain. At the completion of the procedure the sheath was removed and pressure held for hemostasis. No immediate complications were noted.  FINDINGS:  1. There are single renal arteries bilaterally with no significant renal artery stenosis identified. 2. The infrarenal aorta, bilateral common iliac arteries, bilateral hypogastric arteries, and bilateral external iliac arteries are patent. 3. On the left side the common femoral and deep femoral artery are patent. The superficial femoral artery is occluded at its origin. There is reconstitution of the above-knee popliteal artery. The left femoropopliteal bypass graft is patent and is anastomosed to the above-knee popliteal artery. Below this the popliteal artery and tibial peroneal trunk are patent. There is single vessel runoff via the peroneal artery on the left. The anterior tibial and posterior tibial arteries are occluded. 4.  On the right side, common femoral and deep femoral artery patent. The superficial femoral artery is patent. There is a focal stenosis in the distal superficial femoral artery. Popped artery is patent. There is reconstitution of a single peroneal artery distally. The anterior tibial and posterior tibial arteries are occluded.  Waverly Ferrari, MD, FACS [facility]  DATE OF DICTATION:   09/22/2011

## 2011-09-24 ENCOUNTER — Telehealth: Payer: Self-pay | Admitting: *Deleted

## 2011-09-24 NOTE — Telephone Encounter (Signed)
Patient's daughter, Darl Pikes, called in to report Mrs. Gathright had a red rash on the inside of her thighs, posterior thigh down to knees and at the injections site ( Aortogram by Dr. Edilia Bo). I talked to Dr. Edilia Bo and he said to take Benadryl 25 mg OTC q 4hrs. Darl Pikes will call us back if her mother has any other problems, SOB, angioedema, etc.

## 2011-09-25 ENCOUNTER — Other Ambulatory Visit: Payer: Medicare Other

## 2011-10-08 ENCOUNTER — Ambulatory Visit: Payer: Medicare Other | Admitting: Neurosurgery

## 2011-10-08 ENCOUNTER — Other Ambulatory Visit: Payer: Medicare Other

## 2011-10-21 ENCOUNTER — Encounter: Payer: Self-pay | Admitting: Vascular Surgery

## 2011-10-21 ENCOUNTER — Telehealth: Payer: Self-pay | Admitting: Vascular Surgery

## 2011-10-21 NOTE — Telephone Encounter (Signed)
Ret'd call to pt's daughter.  States that pt. has had c/o increased cramping in right ankle, calf, thigh, and buttocks.  Describes a "red" color to bottom of right foot and toes, and states there is a "dusky appearance at the base of the toes."  States pt. now c/o rest pain, that she didn't have when she saw Dr. Edilia Bo in office.  States pt. c/o "heaviness" in right leg.  States "right leg is a little cooler than the left", but that "it is not different than it was."  Has appt. With Dr. Edilia Bo tomorrow, but felt she should make MD aware of changes.  Discussed w/ Dr. Arbie Cookey, and advised pt. can keep appt. tomorrow/ doesn't need to be seen today.  Informed pt's daughter of recommendation, and advised if pt's symptoms worsen, she should go to the ER.  Daughter verb. understanding.

## 2011-10-21 NOTE — Telephone Encounter (Signed)
When I called to confirm Regina Hamilton's appointment for Wednesday, her daughter answered and stated that she had left a message with the triage nurse just a few minutes ago. She feels that her mother needs to be seen today if possible. Her leg is worse since the angiogram, she said it is dusty colored and crampy.  I let her know that [name] would call her back if she left a voicemail, but I would also send [name] a message about the situation. She can be reached at [phone].

## 2011-10-22 ENCOUNTER — Encounter: Payer: Self-pay | Admitting: Vascular Surgery

## 2011-10-22 ENCOUNTER — Ambulatory Visit (INDEPENDENT_AMBULATORY_CARE_PROVIDER_SITE_OTHER): Payer: Medicare Other | Admitting: Vascular Surgery

## 2011-10-22 VITALS — BP 148/55 | HR 70 | Resp 16 | Ht 64.0 in | Wt 125.0 lb

## 2011-10-22 DIAGNOSIS — I70219 Atherosclerosis of native arteries of extremities with intermittent claudication, unspecified extremity: Secondary | ICD-10-CM

## 2011-10-22 NOTE — Assessment & Plan Note (Signed)
This patient has severe claudication of the right lower extremity and rest pain on the right. I have discussed with her previously a structured walking program. She feels that she simply is unable to walk in order to attempt this. She would like to pursue intervention on the right. I've explained that the options would be to stick the left groin and approach the right superficial femoral artery stenosis for PTA and stenting would certainly some increased risk because of the small size of her arteries. The other option would be a fem peroneal artery bypass graft into a very small peroneal artery. I think this would be associated with significant risk given her age and would be very reluctant to consider this unless she had a limb threatening problem. She went to proceed with attempted PT and stenting of the right superficial femoral artery stenosis. She had a reaction to the dye after her most recent arteriogram and so she will be premedicated. I have discussed the indications for arteriography and PTA and stenting of the right superficial femoral artery. She understands the risks include injury to the artery, bleeding, occlusion of the artery and the potential need for surgery. She also understands it this is potentially a limb threatening problem. Her procedure is scheduled for 11/11/2011. If we were unable to address the SFA stenosis and she wished to consider bypass certainly she would need preoperative cardiac clearance although again very reluctant to consider bypass given the small size of her peroneal artery.

## 2011-10-22 NOTE — Progress Notes (Signed)
[facility] [place]  Patient name: Regina Hamilton MRN: [id number] DOB: 03/27/1923 Sex: female  REASON FOR VISIT: follow up after arteriography.  HPI: Regina Hamilton is a 78 y.o. female good presented with progressive ischemia of her right lower extremity. She underwent an arteriogram on 09/22/2011. This showed that her left femoropopliteal bypass graft was patent with single vessel runoff via the peroneal artery on the left. The anterior tibial and posterior tibial arteries were occluded. On the right side she has short focal SFA stenosis and a severe tibial artery occlusive disease. The only patent vessel on the right was a small peroneal artery distally.  She complains of rest pain in her right foot and states that she simply unable to walk because of claudication in her right lower extremity. She has mild symptoms on the left side.   REVIEW OF SYSTEMS: [X ] denotes positive finding; [  ] denotes negative finding  CARDIOVASCULAR:  [ ] chest pain   [ ] dyspnea on exertion    CONSTITUTIONAL:  [ ] fever   [ ] chills  PHYSICAL EXAM: Filed Vitals:   10/22/11 1016  BP: 148/55  Pulse: 70  Resp: 16  Height: 5' 4" (1.626 m)  Weight: 125 lb (56.7 kg)  SpO2: 100%   Body mass index is 21.46 kg/(m^2). GENERAL: The patient is a well-nourished female, in no acute distress. The vital signs are documented above. CARDIOVASCULAR: There is a regular rate and rhythm  PULMONARY: There is good air exchange bilaterally without wheezing or rales. She has palpable femoral pulses are palpable left popliteal pulse. I cannot palpate pedal pulses.  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 I have discussed with her previously a structured walking program. She feels that she simply is unable to walk in order to attempt this. She would like  to pursue intervention on the right. I've explained that the options would be to stick the left groin and approach the right superficial femoral artery stenosis for PTA and stenting would certainly some increased risk because of the small size of her arteries. The other option would be a fem peroneal artery bypass graft into a very small peroneal artery. I think this would be associated with significant risk given her age and would be very reluctant to consider this unless she had a limb threatening problem. She went to proceed with attempted PT and stenting of the right superficial femoral artery stenosis. She had a reaction to the dye after her most recent arteriogram and so she will be premedicated. I have discussed the indications for arteriography and PTA and stenting of the right superficial femoral artery. She understands the risks include injury to the artery, bleeding, occlusion of the artery and the potential need for surgery. She also understands it this is potentially a limb threatening problem. Her procedure is scheduled for 11/11/2011. If we were unable to address the SFA stenosis and she wished to consider bypass certainly she would need preoperative cardiac clearance although again very reluctant to consider bypass given the small size of her peroneal artery.   Keyatta Tolles S [facility]  Beeper: [phone]     

## 2011-10-23 ENCOUNTER — Encounter: Payer: Self-pay | Admitting: Vascular Surgery

## 2011-10-30 ENCOUNTER — Other Ambulatory Visit: Payer: Self-pay

## 2011-10-30 DIAGNOSIS — Z91041 Radiographic dye allergy status: Secondary | ICD-10-CM

## 2011-10-30 MED ORDER — PREDNISONE 50 MG PO TABS: ORAL_TABLET | ORAL | Status: DC

## 2011-10-30 MED ORDER — DIPHENHYDRAMINE HCL 50 MG PO CAPS: ORAL_CAPSULE | ORAL | Status: DC

## 2011-10-31 ENCOUNTER — Encounter (HOSPITAL_COMMUNITY): Payer: Self-pay | Admitting: Pharmacy Technician

## 2011-11-09 MED ORDER — FAMOTIDINE IN NACL 20-0.9 MG/50ML-% IV SOLN
20.0000 mg | INTRAVENOUS | Status: AC
  Administered 2011-11-10: 20 mg via INTRAVENOUS

## 2011-11-09 MED ORDER — DIPHENHYDRAMINE HCL 50 MG/ML IJ SOLN: 25.0000 mg | INTRAMUSCULAR | Status: AC

## 2011-11-09 MED ORDER — METHYLPREDNISOLONE SODIUM SUCC 125 MG IJ SOLR
125.0000 mg | INTRAMUSCULAR | Status: AC
  Administered 2011-11-10: 125 mg via INTRAVENOUS

## 2011-11-10 ENCOUNTER — Telehealth: Payer: Self-pay | Admitting: Vascular Surgery

## 2011-11-10 ENCOUNTER — Ambulatory Visit (HOSPITAL_COMMUNITY)
Admission: RE | Admit: 2011-11-10 | Discharge: 2011-11-10 | Disposition: A | Discharge status: Home / Self Care | Payer: Medicare Other | Source: Ambulatory Visit | Attending: Vascular Surgery | Admitting: Vascular Surgery

## 2011-11-10 ENCOUNTER — Encounter (HOSPITAL_COMMUNITY)
Admission: RE | Disposition: A | Discharge status: Home / Self Care | Payer: Self-pay | Source: Ambulatory Visit | Attending: Vascular Surgery

## 2011-11-10 DIAGNOSIS — I70219 Atherosclerosis of native arteries of extremities with intermittent claudication, unspecified extremity: Secondary | ICD-10-CM | POA: Insufficient documentation

## 2011-11-10 LAB — POCT I-STAT, CHEM 8
BUN: 30 mg/dL — ABNORMAL HIGH (ref 6–23)
Calcium, Ion: 1.24 mmol/L (ref 1.13–1.30)
Chloride: 102 mEq/L (ref 96–112)
Creatinine, Ser: 1.2 mg/dL — ABNORMAL HIGH (ref 0.50–1.10)

## 2011-11-10 LAB — POCT ACTIVATED CLOTTING TIME
Activated Clotting Time: 194 seconds
Activated Clotting Time: 179 seconds

## 2011-11-10 SURGERY — PTA FEMORAL POPLITEAL ARTERY

## 2011-11-10 MED ORDER — ONDANSETRON HCL 4 MG/2ML IJ SOLN: 4.0000 mg | Freq: Four times a day (QID) | INTRAMUSCULAR | Status: DC | PRN

## 2011-11-10 MED ORDER — SODIUM CHLORIDE 0.9 % IV SOLN: 1.0000 mL/kg/h | INTRAVENOUS | Status: DC

## 2011-11-10 MED ORDER — ACETAMINOPHEN 325 MG PO TABS: 650.0000 mg | ORAL_TABLET | ORAL | Status: DC | PRN

## 2011-11-10 MED ORDER — HEPARIN (PORCINE) IN NACL 2-0.9 UNIT/ML-% IJ SOLN
INTRAMUSCULAR | Status: AC
  Filled 2011-11-10: qty 500

## 2011-11-10 MED ORDER — HEPARIN SODIUM (PORCINE) 1000 UNIT/ML IJ SOLN
INTRAMUSCULAR | Status: AC
  Filled 2011-11-10: qty 1

## 2011-11-10 MED ORDER — FENTANYL CITRATE 0.05 MG/ML IJ SOLN
INTRAMUSCULAR | Status: AC
  Filled 2011-11-10: qty 2

## 2011-11-10 MED ORDER — LIDOCAINE HCL (PF) 1 % IJ SOLN
INTRAMUSCULAR | Status: AC
  Filled 2011-11-10: qty 30

## 2011-11-10 MED ORDER — SODIUM CHLORIDE 0.9 % IV SOLN
INTRAVENOUS | Status: DC
  Administered 2011-11-10: 09:00:00 via INTRAVENOUS

## 2011-11-10 MED ORDER — METHYLPREDNISOLONE SODIUM SUCC 125 MG IJ SOLR
INTRAMUSCULAR | Status: AC
  Filled 2011-11-10: qty 2

## 2011-11-10 NOTE — Op Note (Signed)
PATIENT: Regina Hamilton   MRN: [id number] DOB: October 12, 1923    DATE OF PROCEDURE: 11/10/2011  INDICATIONS: Regina Hamilton is a 78 y.o. female who presents with progressive ischemia of her right lower extremity. She had a tight right superficial femoral artery stenosis and also severe tibial artery occlusive disease. Given her age she was felt to be high risk for bypass and therefore we discussed potentially addressing the short segment SFA stenosis. She wished to proceed.  PROCEDURE:  1. Ultrasound-guided access to the left common femoral artery 2. Selective catheterization of the right superficial femoral artery  And Right lower extremity arteriogram 3. PTA of right superficial femoral artery using 4 mm x 4 cm in she is scoped balloon.  SURGEON: Di Kindle. Edilia Bo, MD, FACS  ANESTHESIA: 25 mcg of fentanyl.   EBL: minimal  TECHNIQUE: The patient was taken to the peripheral vascular lab are seen 25 mcg of fentanyl. The left groin was prepped and draped in usual sterile fashion. After the skin was infiltrated with 1% lidocaine, and under ultrasound guidance, the left common femoral artery was cannulated just above the origin of the bypass graft in the left leg. A 5 French sheath was introduced over the wire. Guidewire was advanced into the infrarenal aorta and then a crossover catheter positioned at the aortic bifurcation. The right common iliac artery was engaged and the wire advanced into the external iliac artery. I was unable to advance the wire into the superficial femoral artery. Next a long 6 [place] crossover sheath was placed and positioned in the common femoral artery. A long endhole catheter was exchanged over the wire and positioned in the superficial femoral artery. An arteriogram was obtained through the endhole catheter demonstrating the area of stenosis in the superficial femoral artery. It was a 95% stenosis. The patient received 4000 units of IV heparin. ACT was greater than 200. The  superficial femoral artery proximal and distal to the stenosis was measured and I selected a 4 mm x 4 cm angioscope cutting balloon. A [place] core wire was advanced through the stenosis. The cutting balloon was then positioned across the stenosis. It was inflated to 8 atmospheres for 1 minute. This was then retracted and completion film showed an excellent result no residual stenosis. The balloon and wire were removed. Sheath was retracted into the left external iliac artery. At the completion of the procedure pressure was held for hemostasis. No immediate complications were noted.  FINDINGS:  1. 95% right superficial femoral artery which was successfully ballooned as described above with no residual stenosis. 2. Severe tibial artery occlusive disease with occluded anterior tibial tibial peroneal trunk proximal peroneal and posterior tibial arteries. Reconstitution of the peroneal artery in the midcalf as the only patent vessel.  Waverly Ferrari, MD, FACS [facility]  DATE OF DICTATION:   11/10/2011

## 2011-11-10 NOTE — Telephone Encounter (Signed)
Message copied by Margaretmary Eddy on Mon Nov 10, 2011  3:24 PM ------      Message from: Melene Plan      Created: Mon Nov 10, 2011  2:36 PM      Regarding: FW: charge and fu                   ----- Message -----         From: Chuck Hint, MD         Sent: 11/10/2011  11:07 AM           To: Reuel Derby, Melene Plan, RN      Subject: charge and fu                                            PROCEDURE:       1. Ultrasound-guided access to the left common femoral artery      2. Selective catheterization of the right superficial femoral artery (3rd order cath) And Right lower extremity arteriogram      3. PTA of right superficial femoral artery using 4 mm x 4 cm in she is scoped balloon.            SURGEON: Di Kindle. Edilia Bo, MD, FACS            I will need to see her in 2-3 weeks. Also Herbert Seta I probably need to check on her toes in 2-3 weeks. She was my first angiogram date of birth 06/30/1951.      Thanks. CSD.

## 2011-11-10 NOTE — Interval H&P Note (Signed)
History and Physical Interval Note:  11/10/2011 10:03 AM  Regina Hamilton  has presented today for surgery, with the diagnosis of pvd  The various methods of treatment have been discussed with the patient and family. After consideration of risks, benefits and other options for treatment, the patient has consented to ARTERIOGRAM OF RIGHT LOWER EXTREMITY AND POSSIBLE ANGIOPLASTY AND STENTING OF RIGHT SUPERFICIAL FEMORAL ARTERY. The patient's history has been reviewed, patient examined, no change in status, stable for surgery.  I have reviewed the patient's chart and labs.  Questions were answered to the patient's satisfaction.     [name] S

## 2011-11-10 NOTE — H&P (View-Only) (Signed)
[facility] [place]  Patient name: Regina Hamilton MRN: [id number] DOB: 12-29-1923 Sex: female  REASON FOR VISIT: follow up after arteriography.  HPI: Regina Hamilton is a 78 y.o. female good presented with progressive ischemia of her right lower extremity. She underwent an arteriogram on 09/22/2011. This showed that her left femoropopliteal bypass graft was patent with single vessel runoff via the peroneal artery on the left. The anterior tibial and posterior tibial arteries were occluded. On the right side she has short focal SFA stenosis and a severe tibial artery occlusive disease. The only patent vessel on the right was a small peroneal artery distally.  She complains of rest pain in her right foot and states that she simply unable to walk because of claudication in her right lower extremity. She has mild symptoms on the left side.   REVIEW OF SYSTEMS: Arly.Keller ] denotes positive finding; [  ] denotes negative finding  CARDIOVASCULAR:  [ ]  chest pain   [ ]  dyspnea on exertion    CONSTITUTIONAL:  [ ]  fever   [ ]  chills  PHYSICAL EXAM: Filed Vitals:   10/22/11 1016  BP: 148/55  Pulse: 70  Resp: 16  Height: 5\' 4"  (1.626 m)  Weight: 125 lb (56.7 kg)  SpO2: 100%   Body mass index is 21.46 kg/(m^2). GENERAL: The patient is a well-nourished female, in no acute distress. The vital signs are documented above. CARDIOVASCULAR: There is a regular rate and rhythm  PULMONARY: There is good air exchange bilaterally without wheezing or rales. She has palpable femoral pulses are palpable left popliteal pulse. I cannot palpate pedal pulses.  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 I have discussed with her previously a structured walking program. She feels that she simply is unable to walk in order to attempt this. She would like  to pursue intervention on the right. I've explained that the options would be to stick the left groin and approach the right superficial femoral artery stenosis for PTA and stenting would certainly some increased risk because of the small size of her arteries. The other option would be a fem peroneal artery bypass graft into a very small peroneal artery. I think this would be associated with significant risk given her age and would be very reluctant to consider this unless she had a limb threatening problem. She went to proceed with attempted PT and stenting of the right superficial femoral artery stenosis. She had a reaction to the dye after her most recent arteriogram and so she will be premedicated. I have discussed the indications for arteriography and PTA and stenting of the right superficial femoral artery. She understands the risks include injury to the artery, bleeding, occlusion of the artery and the potential need for surgery. She also understands it this is potentially a limb threatening problem. Her procedure is scheduled for 11/11/2011. If we were unable to address the SFA stenosis and she wished to consider bypass certainly she would need preoperative cardiac clearance although again very reluctant to consider bypass given the small size of her peroneal artery.   Memori Sammon S [facility] of [place] Beeper: [phone]

## 2011-11-11 ENCOUNTER — Encounter (HOSPITAL_COMMUNITY): Payer: Self-pay

## 2011-11-25 ENCOUNTER — Encounter: Payer: Self-pay | Admitting: Vascular Surgery

## 2011-11-26 ENCOUNTER — Encounter: Payer: Self-pay | Admitting: Vascular Surgery

## 2011-11-26 ENCOUNTER — Ambulatory Visit (INDEPENDENT_AMBULATORY_CARE_PROVIDER_SITE_OTHER): Payer: Medicare Other | Admitting: Vascular Surgery

## 2011-11-26 VITALS — BP 134/99 | HR 70 | Resp 18 | Ht 63.0 in | Wt 127.9 lb

## 2011-11-26 DIAGNOSIS — I6529 Occlusion and stenosis of unspecified carotid artery: Secondary | ICD-10-CM

## 2011-11-26 DIAGNOSIS — I70219 Atherosclerosis of native arteries of extremities with intermittent claudication, unspecified extremity: Secondary | ICD-10-CM

## 2011-11-26 DIAGNOSIS — Z48812 Encounter for surgical aftercare following surgery on the circulatory system: Secondary | ICD-10-CM

## 2011-11-26 NOTE — Progress Notes (Signed)
[facility] of [place]  Patient name: Regina Hamilton MRN: [id number] DOB: 1923/07/29 Sex: female  REASON FOR VISIT: follow up after PTA of right superficial femoral artery  HPI: Regina Hamilton is a 78 y.o. female with known severe tibial artery occlusive disease on the right. She presented with progressive ischemia of the right foot with rest pain and wish to pursue intervention of a focal right superficial femoral artery stenosis. She underwent selective catheterization of the right superficial femoral artery with PTA of the right superficial femoral artery using a 4 mm x 4 cm cutting balloon. She had an excellent result. This was for a 95% stenosis and was no residual stenosis.  She states that since the procedure her foot feels significantly better and she's been more active.   REVIEW OF SYSTEMS: Arly.Keller ] denotes positive finding; [  ] denotes negative finding  CARDIOVASCULAR:  [ ]  chest pain   [ ]  dyspnea on exertion    CONSTITUTIONAL:  [ ]  fever   [ ]  chills  PHYSICAL EXAM: Filed Vitals:   11/26/11 1250  BP: 134/99  Pulse: 70  Resp: 18  Height: 5\' 3"  (1.6 m)  Weight: 127 lb 14.4 oz (58.015 kg)   Body mass index is 22.66 kg/(m^2). GENERAL: The patient is a well-nourished female, in no acute distress. The vital signs are documented above. CARDIOVASCULAR: There is a regular rate and rhythm  PULMONARY: There is good air exchange bilaterally without wheezing or rales. She has a palpable right popliteal pulse. Right foot is warm and well-perfused.  MEDICAL ISSUES: Overall pleased with her progress. She's had a good result from her angioplasty. I've encouraged her to stay as active as possible. She is on aspirin. As I did not place a stent, and given her multiple allergies, I did not place her on Plavix. I see her back in 6 months with follow up ABIs. We'll try to coordinate this visit with her next protocol carotid duplex scan. She has call sooner she has  problems.  Malachai Schalk S [facility] [place] Beeper: [phone]

## 2011-11-27 NOTE — Addendum Note (Signed)
Addended by: Lorin Mercy K on: 11/27/2011 08:30 AM   Modules accepted: Orders

## 2012-01-16 ENCOUNTER — Encounter: Payer: Self-pay | Admitting: *Deleted

## 2012-01-16 DIAGNOSIS — Z950 Presence of cardiac pacemaker: Secondary | ICD-10-CM | POA: Insufficient documentation

## 2012-01-21 ENCOUNTER — Encounter: Payer: Self-pay | Admitting: Internal Medicine

## 2012-01-21 ENCOUNTER — Ambulatory Visit (INDEPENDENT_AMBULATORY_CARE_PROVIDER_SITE_OTHER): Payer: Medicare Other | Admitting: Internal Medicine

## 2012-01-21 VITALS — BP 138/70 | HR 69 | Resp 18 | Ht 62.0 in | Wt 126.1 lb

## 2012-01-21 DIAGNOSIS — I4891 Unspecified atrial fibrillation: Secondary | ICD-10-CM

## 2012-01-21 DIAGNOSIS — I10 Essential (primary) hypertension: Secondary | ICD-10-CM

## 2012-01-21 DIAGNOSIS — Z950 Presence of cardiac pacemaker: Secondary | ICD-10-CM

## 2012-01-21 LAB — PACEMAKER DEVICE OBSERVATION: BRDY-0002RV: 70 {beats}/min

## 2012-01-21 NOTE — Assessment & Plan Note (Signed)
Currently well controlled.  

## 2012-01-21 NOTE — Patient Instructions (Signed)
Your physician wants you to follow-up in: 1 year with Dr Klein.  You will receive a reminder letter in the mail two months in advance. If you don't receive a letter, please call our office to schedule the follow-up appointment.  

## 2012-01-21 NOTE — Progress Notes (Signed)
Patient Care Team: Jacques Navy, MD as PCP - General Shelda Pal, MD (Orthopedic Surgery) Duke Salvia, MD (Cardiology) Sherrell Puller, MD   HPI  Regina Hamilton is a 78 y.o. female Seen in followup for atrial fibrillation treated with AV junction ablation and pacemaker implantation 2010. Attempts to use anticoagulation with Coumadin failed because of a rash.  She has peripheral vascular disease and underwent angioplasty of her lower extremities by Dr. Durwin Nora with improvement in her ambulation. She is walking  Echocardiogram 9/08 demonstrated normal left ventricular function Myoview 2008 normal  Past Medical History  Diagnosis Date  . Peripheral vascular disease   . Carotid artery occlusion   . Hypertension   . Hyperlipidemia   . Arthritis   . Pacemaker 2008    [facility] in [place], [place]    Past Surgical History  Procedure Date  . Spine surgery 1970    Cervical fusion  . Eye surgery     Bilateral Cornea Transplants  . Joint replacement     Right & Left Total Hip   . Cardio ablation- pacemaker   . Pr vein bypass graft,aorto-fem-pop   . Total hip revision 04-08-2010  . Cholecystectomy 05/20/11    Current Outpatient Prescriptions  Medication Sig Dispense Refill  . Acetylcarn-Alpha Lipoic Acid 400-200 MG CAPS Take 1 tablet by mouth daily.      Marland Kitchen aliskiren (TEKTURNA) 150 MG tablet Take 150 mg by mouth daily.      Marland Kitchen ALPRAZolam (XANAX) 0.25 MG tablet Take 0.25 mg by mouth at bedtime as needed. For insomnia/anxiety      . amLODipine (NORVASC) 10 MG tablet Take 10 mg by mouth daily.      Marland Kitchen ascorbic acid (VITAMIN C) 500 MG tablet Take 500 mg by mouth daily.      Marland Kitchen aspirin 325 MG tablet Take 325 mg by mouth daily.      Marland Kitchen CALCIUM & MAGNESIUM CARBONATES PO Take 1 tablet by mouth daily.       . Cholecalciferol (VITAMIN D3) 2000 UNITS capsule Take 1,000 Units by mouth daily.       . diphenhydrAMINE (BENADRYL) 50 MG capsule Take one capsule at 9:30 AM on 11/10/11   1 capsule  0  . dorzolamide (TRUSOPT) 2 % ophthalmic solution Place 1 drop into the left eye 2 (two) times daily.       Marland Kitchen gabapentin (NEURONTIN) 100 MG capsule Take 100 mg by mouth daily.      Marland Kitchen loteprednol (LOTEMAX) 0.5 % ophthalmic suspension Place 1 drop into both eyes daily.      Marland Kitchen omeprazole (PRILOSEC) 10 MG capsule Take 20 mg by mouth daily.       . valsartan (DIOVAN) 160 MG tablet Take 160 mg by mouth daily.      . valsartan-hydrochlorothiazide (DIOVAN-HCT) 160-12.5 MG per tablet Take 1 tablet by mouth daily.        Allergies  Allergen Reactions  . Midazolam     REACTION: hallucinations per daughter  . Alphagan P (Brimonidine Tartrate) Photosensitivity    Redness bilateral eye  . Oxycodone Hcl Itching  . Clonidine Derivatives Other (See Comments)    Pt's daughter states she has sever hallucinations  . Contrast Media (Iodinated Diagnostic Agents)     Contrast dye.  . Diltiazem Hcl     Unknown  . Gabapentin     Unknown  . Iron     REACTION: RASH  . Meloxicam     Unknown  .  Moxifloxacin     REACTION: Tongue swelling; mouth ulcers; confusion  . Warfarin Sodium     Unknown    Review of Systems negative except from HPI and PMH  Physical Exam BP 138/70  Pulse 69  Resp 18  Ht 5\' 2"  (1.575 m)  Wt 126 lb 1.9 oz (57.208 kg)  BMI 23.07 kg/m2  SpO2 94% Well developed and well nourished in no acute distress HENT normal E scleral and icterus clear Neck Supple JVP flat; l Clear to ausculation  Regular rate and rhythm, no murmurs gallops or rub Soft with active bowel sounds No clubbing cyanosis none Edema Alert and oriented, grossly normal motor and sensory function Skin Warm and Dry    Assessment and  Plan

## 2012-01-21 NOTE — Assessment & Plan Note (Signed)
The patient's device was interrogated.  The information was reviewed. No changes were made in the programming.    

## 2012-01-21 NOTE — Assessment & Plan Note (Signed)
Permanent atrial fibrillation; still no resolution regarding anticoagulation

## 2012-02-02 ENCOUNTER — Encounter: Payer: Self-pay | Admitting: Internal Medicine

## 2012-02-22 ENCOUNTER — Encounter (HOSPITAL_COMMUNITY): Payer: Self-pay | Admitting: Emergency Medicine

## 2012-02-22 ENCOUNTER — Emergency Department (HOSPITAL_COMMUNITY)
Admission: EM | Admit: 2012-02-22 | Discharge: 2012-02-22 | Disposition: A | Discharge status: Home / Self Care | Payer: Medicare Other | Attending: Emergency Medicine | Admitting: Emergency Medicine

## 2012-02-22 ENCOUNTER — Emergency Department (HOSPITAL_COMMUNITY): Payer: Medicare Other

## 2012-02-22 DIAGNOSIS — E785 Hyperlipidemia, unspecified: Secondary | ICD-10-CM | POA: Insufficient documentation

## 2012-02-22 DIAGNOSIS — Z9889 Other specified postprocedural states: Secondary | ICD-10-CM | POA: Insufficient documentation

## 2012-02-22 DIAGNOSIS — I10 Essential (primary) hypertension: Secondary | ICD-10-CM | POA: Insufficient documentation

## 2012-02-22 DIAGNOSIS — M129 Arthropathy, unspecified: Secondary | ICD-10-CM | POA: Insufficient documentation

## 2012-02-22 DIAGNOSIS — Z7982 Long term (current) use of aspirin: Secondary | ICD-10-CM | POA: Insufficient documentation

## 2012-02-22 DIAGNOSIS — Z950 Presence of cardiac pacemaker: Secondary | ICD-10-CM | POA: Insufficient documentation

## 2012-02-22 DIAGNOSIS — Z96649 Presence of unspecified artificial hip joint: Secondary | ICD-10-CM | POA: Insufficient documentation

## 2012-02-22 DIAGNOSIS — I739 Peripheral vascular disease, unspecified: Secondary | ICD-10-CM

## 2012-02-22 DIAGNOSIS — L989 Disorder of the skin and subcutaneous tissue, unspecified: Secondary | ICD-10-CM | POA: Insufficient documentation

## 2012-02-22 DIAGNOSIS — Z8679 Personal history of other diseases of the circulatory system: Secondary | ICD-10-CM | POA: Insufficient documentation

## 2012-02-22 DIAGNOSIS — M25569 Pain in unspecified knee: Secondary | ICD-10-CM

## 2012-02-22 DIAGNOSIS — M549 Dorsalgia, unspecified: Secondary | ICD-10-CM | POA: Insufficient documentation

## 2012-02-22 DIAGNOSIS — M25559 Pain in unspecified hip: Secondary | ICD-10-CM

## 2012-02-22 MED ORDER — TRAMADOL HCL 50 MG PO TABS: 50.0000 mg | ORAL_TABLET | Freq: Four times a day (QID) | ORAL | Status: DC | PRN

## 2012-02-22 MED ORDER — TRAMADOL HCL 50 MG PO TABS
50.0000 mg | ORAL_TABLET | Freq: Once | ORAL | Status: AC
  Administered 2012-02-22: 50 mg via ORAL
  Filled 2012-02-22: qty 1

## 2012-02-22 NOTE — ED Provider Notes (Addendum)
History     CSN: [id number]  Arrival date & time 02/22/12  1140   First MD Initiated Contact with Patient 02/22/12 1147      Chief Complaint  Patient presents with  . Hip Pain    Left  . Knee Pain    Left    (Consider location/radiation/quality/duration/timing/severity/associated sxs/prior treatment) HPI Comments: Pt has had both hips replaced, apparently fell 6 ays ago, patient doesn't recall, but family heard her and found her on the floor.  This is not unusual per daughter.  Pt has no sig pain when laying still, but when she has been trying to walk, she gets pain to left hip and some to outside of left knee.  No HA, neck pain.  Some low back pain, but she has a h/o compression fractures s/p kyphoplasty about 1 year ago with Dr. Danielle Dess.  She isn't sure if back pain is worse or different than baseline. She has been trying motrin with no sig relief.    Patient is a 79 y.o. female presenting with hip pain and knee pain. The history is provided by the patient, a relative and the EMS personnel.  Hip Pain Pertinent negatives include no chest pain, no abdominal pain, no headaches and no shortness of breath.  Knee Pain Pertinent negatives include no chest pain, no abdominal pain, no headaches and no shortness of breath.    Past Medical History  Diagnosis Date  . Peripheral vascular disease   . Carotid artery occlusion   . Hypertension   . Hyperlipidemia   . Arthritis   . Pacemaker 2008    [facility] in [place], [place]    Past Surgical History  Procedure Date  . Spine surgery 1970    Cervical fusion  . Eye surgery     Bilateral Cornea Transplants  . Joint replacement     Right & Left Total Hip   . Cardio ablation- pacemaker   . Pr vein bypass graft,aorto-fem-pop   . Total hip revision 04-08-2010  . Cholecystectomy 05/20/11    Family History  Problem Relation Age of Onset  . Heart disease Mother     Heart disease before age 46  . Heart disease Father     Heat  Diease before age 63  . Heart disease Maternal Grandmother   . Heart disease Paternal Grandmother     History  Substance Use Topics  . Smoking status: Never Smoker   . Smokeless tobacco: Never Used  . Alcohol Use: No    OB History    Grav Para Term Preterm Abortions TAB SAB Ect Mult Living                  Review of Systems  HENT: Negative for neck pain.   Respiratory: Negative for chest tightness and shortness of breath.   Cardiovascular: Negative for chest pain.  Gastrointestinal: Negative for abdominal pain.  Musculoskeletal: Positive for back pain and arthralgias. Negative for joint swelling.  Skin: Positive for color change.  Neurological: Negative for weakness, numbness and headaches.    Allergies  Midazolam; Alphagan p; Oxycodone hcl; Clonidine derivatives; Contrast media; Diltiazem hcl; Iron; Meloxicam; Moxifloxacin; and Warfarin sodium  Home Medications   Current Outpatient Rx  Name  Route  Sig  Dispense  Refill  . ACETYLCARN-ALPHA LIPOIC ACID 400-200 MG PO CAPS   Oral   Take 2 tablets by mouth daily.          . ALISKIREN FUMARATE 150 MG PO TABS  Oral   Take 225 mg by mouth daily. Takes 1 and 1/2         . ALPRAZOLAM 0.25 MG PO TABS   Oral   Take 0.125 mg by mouth at bedtime as needed. Takes 1/2 For insomnia/anxiety         . AMLODIPINE BESYLATE 10 MG PO TABS   Oral   Take 10 mg by mouth daily.         . ASPIRIN 325 MG PO TABS   Oral   Take 325 mg by mouth daily.         Marland Kitchen CALCIUM CARBONATE 600 MG PO TABS   Oral   Take 600 mg by mouth daily.         Marland Kitchen CALCIUM-MAGNESIUM-ZINC 333-133-5 MG PO TABS   Oral   Take 1 tablet by mouth daily.         Marland Kitchen VITAMIN D 1000 UNITS PO TABS   Oral   Take 1,000 Units by mouth daily.         . DORZOLAMIDE HCL 2 % OP SOLN   Left Eye   Place 1 drop into the left eye 2 (two) times daily.          Marland Kitchen GABAPENTIN 100 MG PO CAPS   Oral   Take 100 mg by mouth daily.         Marland Kitchen LOTEPREDNOL  ETABONATE 0.5 % OP SUSP   Both Eyes   Place 1 drop into both eyes daily.         Marland Kitchen OMEPRAZOLE 20 MG PO CPDR   Oral   Take 20 mg by mouth daily.         Marland Kitchen VALSARTAN-HYDROCHLOROTHIAZIDE 160-12.5 MG PO TABS   Oral   Take 1 tablet by mouth daily.         . TRAMADOL HCL 50 MG PO TABS   Oral   Take 1 tablet (50 mg total) by mouth every 6 (six) hours as needed for pain.   20 tablet   0     BP 155/54  Pulse 70  Temp 97.8 F (36.6 C) (Oral)  Resp 12  SpO2 99%  Physical Exam  Nursing note and vitals reviewed. Constitutional: She appears well-developed and well-nourished. No distress.  HENT:  Head: Normocephalic and atraumatic.  Neck: Normal range of motion. Neck supple.  Cardiovascular: Normal rate and regular rhythm.  Exam reveals decreased pulses.   No murmur heard. Pulmonary/Chest: Effort normal.  Abdominal: Soft. She exhibits no distension. There is no tenderness.  Musculoskeletal:       Left hip: She exhibits decreased range of motion and tenderness. She exhibits no deformity.       Lumbar back: She exhibits tenderness and bony tenderness.  Neurological: She is alert.  Skin: Skin is warm and dry. No bruising and no ecchymosis noted. She is not diaphoretic. No erythema. No pallor.       Right foot sole and distal toes are more red compared to left foot.      ED Course  Procedures (including critical care time)  Labs Reviewed - No data to display Dg Lumbar Spine Complete  02/22/2012  *RADIOLOGY REPORT*  Clinical Data: Fall.  Low back pain  LUMBAR SPINE - COMPLETE 4+ VIEW  Comparison: 09/27/2010  Findings: Moderate scoliosis.  Interval cement vertebral augmentation L1 and L2 vertebral bodies with  satisfactory cement positioning.  Minimal fractures of these levels.  Negative for acute fracture.  Normal alignment.  Disc degeneration and mild spurring L3-4 and L4-5.  Negative for pars defect.  IMPRESSION: Negative for acute fracture.   Original Report Authenticated By:  Janeece Riggers, M.D.    Dg Hip Complete Left  02/22/2012  *RADIOLOGY REPORT*  Clinical Data: Fall.  Left hip pain  LEFT HIP - COMPLETE 2+ VIEW  Comparison: 06/20/2009  Findings: Right hip replacement in satisfactory position and alignment. Mild loosening of the femoral stem.  Left hip replacement has been performed.   Normal alignment and no fracture. Mild loosening of the femoral stem.  There is a round metal wire   inferior to the acetabular prosthesis.  This was present on the preop study  and may be related to prior surgery.  Negative for pelvic fracture.  IMPRESSION: Bilateral hip prosthesis.  No acute fracture.   Original Report Authenticated By: Janeece Riggers, M.D.    Dg Knee Complete 4 Views Left  02/22/2012  *RADIOLOGY REPORT*  Clinical Data: Fall.  Left knee pain  LEFT KNEE - COMPLETE 4+ VIEW  Comparison: 06/20/2009  Findings: Negative for fracture.  Generalized osteopenia and mild degenerative change in the knee joint.  No joint effusion.  Mild arterial calcification.  IMPRESSION: Negative for fracture.   Original Report Authenticated By: Janeece Riggers, M.D.      1. Hip pain   2. Claudication   3. Peripheral vascular disease   4. Knee pain     ra sat is 99% and I interpret to be normal  ECG at time 12:02 shows ventricular paced rhythm at rate 70, similar to ECG from 11/10/11.  Abn ECG, but no acute changes.   1:27 PM Discussed at length with pt and family about neg films, need to follow up with Dr. Charlann Boxer for fall and hip pain as well as Dr. Edilia Bo for right foot pain.  They do not wish to pursue CT scan of pelvis.    MDM  Will get plain films of left hip, pelvis, l spine and left knee.  Pt also with worsening claudication of right foot, recommend close follow up with Dr. Edilia Bo whom she has seen previously and has had prior fem pop bypass already.          Gavin Pound. Oletta Lamas, MD 02/22/12 1331  Gavin Pound. Trishelle Devora, MD 02/22/12 1332

## 2012-02-22 NOTE — ED Notes (Signed)
[id number]<XB> Expected date:<BR> Expected time:<BR> Means of arrival:<BR> Comments:<BR> Room 2

## 2012-02-22 NOTE — ED Notes (Signed)
Per EMS: Pt fell last Monday and c/o 4/10 left hip pain that radiates towards her groin. Pain increases to 10/10 with activity including ambulating. Pt has a history of hip replacement to the left hip. Pt states that she does not remember falling, family reported hearing the fall and found the patient in the floor.

## 2012-02-22 NOTE — ED Notes (Signed)
ZOX:[id number]<AV> Expected date:02/22/12<BR> Expected time:11:36 AM<BR> Means of arrival:Ambulance<BR> Comments:<BR> Fell on Monday-c/o left hip pain

## 2012-02-22 NOTE — Discharge Instructions (Signed)
Arthralgia Your caregiver has diagnosed you as suffering from an arthralgia. Arthralgia means there is pain in a joint. This can come from many reasons including:  Bruising the joint which causes soreness (inflammation) in the joint.  Wear and tear on the joints which occur as we grow older (osteoarthritis).  Overusing the joint.  Various forms of arthritis.  Infections of the joint. Regardless of the cause of pain in your joint, most of these different pains respond to anti-inflammatory drugs and rest. The exception to this is when a joint is infected, and these cases are treated with antibiotics, if it is a bacterial infection. HOME CARE INSTRUCTIONS   Rest the injured area for as long as directed by your caregiver. Then slowly start using the joint as directed by your caregiver and as the pain allows. Crutches as directed may be useful if the ankles, knees or hips are involved. If the knee was splinted or casted, continue use and care as directed. If an stretchy or elastic wrapping bandage has been applied today, it should be removed and re-applied every 3 to 4 hours. It should not be applied tightly, but firmly enough to keep swelling down. Watch toes and feet for swelling, bluish discoloration, coldness, numbness or excessive pain. If any of these problems (symptoms) occur, remove the ace bandage and re-apply more loosely. If these symptoms persist, contact your caregiver or return to this location.  For the first 24 hours, keep the injured extremity elevated on pillows while lying down.  Apply ice for 15 to 20 minutes to the sore joint every couple hours while awake for the first half day. Then 3 to 4 times per day for the first 48 hours. Put the ice in a plastic bag and place a towel between the bag of ice and your skin.  Wear any splinting, casting, elastic bandage applications, or slings as instructed.  Only take over-the-counter or prescription medicines for pain, discomfort, or  fever as directed by your caregiver. Do not use aspirin immediately after the injury unless instructed by your physician. Aspirin can cause increased bleeding and bruising of the tissues.  If you were given crutches, continue to use them as instructed and do not resume weight bearing on the sore joint until instructed. Persistent pain and inability to use the sore joint as directed for more than 2 to 3 days are warning signs indicating that you should see a caregiver for a follow-up visit as soon as possible. Initially, a hairline fracture (break in bone) may not be evident on X-rays. Persistent pain and swelling indicate that further evaluation, non-weight bearing or use of the joint (use of crutches or slings as instructed), or further X-rays are indicated. X-rays may sometimes not show a small fracture until a week or 10 days later. Make a follow-up appointment with your own caregiver or one to whom we have referred you. A radiologist (specialist in reading X-rays) may read your X-rays. Make sure you know how you are to obtain your X-ray results. Do not assume everything is normal if you do not hear from us. SEEK MEDICAL CARE IF: Bruising, swelling, or pain increases. SEEK IMMEDIATE MEDICAL CARE IF:   Your fingers or toes are numb or blue.  The pain is not responding to medications and continues to stay the same or get worse.  The pain in your joint becomes severe.  You develop a fever over 102 F (38.9 C).  It becomes impossible to move or use the joint.   MAKE SURE YOU:   Understand these instructions.  Will watch your condition.  Will get help right away if you are not doing well or get worse. Document Released: 02/03/2005 Document Revised: 04/28/2011 Document Reviewed: 09/22/2007 Exit[employer] Patient Information 2013 [employer].  

## 2012-02-23 ENCOUNTER — Other Ambulatory Visit: Payer: Self-pay

## 2012-02-23 DIAGNOSIS — I70219 Atherosclerosis of native arteries of extremities with intermittent claudication, unspecified extremity: Secondary | ICD-10-CM

## 2012-02-23 NOTE — Progress Notes (Signed)
Pt's daughter called to report increased pain in toes of right foot, and the sole of the right foot over past 2 weeks.  States "pt. complains that she can hardly walk because it hurts so bad."  Reports pt.was seen in the ER recently due to a fall, and was advised by the ER MD to follow-up with Dr. Edilia Bo within next week.  Discussed w/ Lauree Chandler, NP, and rec'd order to schedule pt. for ABI's in addition to follow-up w/ Dr. Edilia Bo.  Pt's daughter given appt. for 3:00 PM 02/25/12.  Verb. Understanding.

## 2012-02-24 ENCOUNTER — Encounter: Payer: Self-pay | Admitting: Vascular Surgery

## 2012-02-25 ENCOUNTER — Encounter: Payer: Self-pay | Admitting: Vascular Surgery

## 2012-02-25 ENCOUNTER — Encounter (INDEPENDENT_AMBULATORY_CARE_PROVIDER_SITE_OTHER): Payer: Medicare Other | Admitting: *Deleted

## 2012-02-25 ENCOUNTER — Ambulatory Visit (INDEPENDENT_AMBULATORY_CARE_PROVIDER_SITE_OTHER): Payer: Medicare Other | Admitting: Vascular Surgery

## 2012-02-25 VITALS — BP 172/53 | HR 69 | Ht 62.0 in | Wt 125.0 lb

## 2012-02-25 DIAGNOSIS — Z48812 Encounter for surgical aftercare following surgery on the circulatory system: Secondary | ICD-10-CM

## 2012-02-25 DIAGNOSIS — I70219 Atherosclerosis of native arteries of extremities with intermittent claudication, unspecified extremity: Secondary | ICD-10-CM

## 2012-02-25 DIAGNOSIS — I739 Peripheral vascular disease, unspecified: Secondary | ICD-10-CM

## 2012-02-25 NOTE — Progress Notes (Signed)
[facility]  Patient name: Regina Hamilton MRN: [id number] DOB: 06/18/1923 Sex: female  REASON FOR VISIT: pain in right foot.  HPI: Regina Hamilton is a 79 y.o. female who has undergone previous PTA of a right superficial femoral artery stenosis on 11/10/2011. She had presented with progressive ischemia of her right lower extremity. She had a tight stenosis of the right superficial femoral artery and also severe tibial artery occlusive disease. Given her age she is really not a candidate for a fem-fem to distal peroneal artery bypass graft and I performed PTA of the right superficial femoral artery using a 4 mm x 4 cm Angio-Seal cutting balloon. She had good results from this and had a palpable popliteal pulse on her first follow up visit. She returns today complaining of some pain in her right foot and has some reddish discoloration of the forefoot. He describes rest pain of the right foot. She also has claudication of the right leg.   REVIEW OF SYSTEMS: [X ] denotes positive finding; [  ] denotes negative finding  CARDIOVASCULAR:  [ ] chest pain   [ ] dyspnea on exertion    CONSTITUTIONAL:  [ ] fever   [ ] chills  PHYSICAL EXAM: Filed Vitals:   02/25/12 1547  BP: 172/53  Pulse: 69  Height: 5' 2" (1.575 m)  Weight: 125 lb (56.7 kg)  SpO2: 99%   Body mass index is 22.86 kg/(m^2). GENERAL: The patient is a well-nourished female, in no acute distress. The vital signs are documented above. CARDIOVASCULAR: There is a regular rate and rhythm  PULMONARY: There is good air exchange bilaterally without wheezing or rales. I am unable to palpate a popliteal pulse on the right. The right foot appears chronically ischemic.  MEDICAL ISSUES: I think she has likely had progression of her superficial femoral artery occlusive disease on the right or occlusion of the superficial femoral artery. Think this could quickly become a limb threatening problem. I have recommended that we  proceed with arteriography to evaluate her for possible repeat endovascular intervention on the right. She does have a dye allergy and we will premedicate her for this.I have reviewed with the patient the indications for arteriography. In addition, I have reviewed the potential complications of arteriography including but not limited to: Bleeding, arterial injury, arterial thrombosis, dye action, renal insufficiency, or other unpredictable medical problems. I have explained to the patient that if we find disease amenable to angioplasty we could potentially address this at the same time. I have discussed the potential complications of angioplasty and stenting, including but not limited to: Bleeding, arterial thrombosis, arterial injury, dissection, or the need for surgical intervention. We will make further recommendations pending the results of her arteriogram.   Orvilla Truett S [facility] [place] Beeper: [phone]     

## 2012-02-26 ENCOUNTER — Other Ambulatory Visit: Payer: Self-pay

## 2012-02-26 ENCOUNTER — Encounter (HOSPITAL_COMMUNITY): Payer: Self-pay | Admitting: Pharmacy Technician

## 2012-02-26 DIAGNOSIS — Z91041 Radiographic dye allergy status: Secondary | ICD-10-CM

## 2012-02-26 MED ORDER — PREDNISONE 50 MG PO TABS: ORAL_TABLET | ORAL | Status: DC

## 2012-03-01 ENCOUNTER — Encounter (HOSPITAL_COMMUNITY): Payer: Self-pay | Admitting: General Practice

## 2012-03-01 ENCOUNTER — Telehealth: Payer: Self-pay | Admitting: Vascular Surgery

## 2012-03-01 ENCOUNTER — Observation Stay (HOSPITAL_COMMUNITY)
Admission: RE | Admit: 2012-03-01 | Discharge: 2012-03-02 | Disposition: A | Discharge status: Home / Self Care | Payer: Medicare Other | Source: Ambulatory Visit | Attending: Vascular Surgery | Admitting: Vascular Surgery

## 2012-03-01 ENCOUNTER — Other Ambulatory Visit: Payer: Self-pay | Admitting: *Deleted

## 2012-03-01 ENCOUNTER — Encounter (HOSPITAL_COMMUNITY)
Admission: RE | Disposition: A | Discharge status: Home / Self Care | Payer: Self-pay | Source: Ambulatory Visit | Attending: Vascular Surgery

## 2012-03-01 DIAGNOSIS — I739 Peripheral vascular disease, unspecified: Secondary | ICD-10-CM

## 2012-03-01 DIAGNOSIS — I70219 Atherosclerosis of native arteries of extremities with intermittent claudication, unspecified extremity: Secondary | ICD-10-CM

## 2012-03-01 DIAGNOSIS — I10 Essential (primary) hypertension: Secondary | ICD-10-CM | POA: Insufficient documentation

## 2012-03-01 DIAGNOSIS — Z91041 Radiographic dye allergy status: Secondary | ICD-10-CM

## 2012-03-01 DIAGNOSIS — I70229 Atherosclerosis of native arteries of extremities with rest pain, unspecified extremity: Principal | ICD-10-CM | POA: Insufficient documentation

## 2012-03-01 DIAGNOSIS — Z48816 Encounter for surgical aftercare following surgery on the genitourinary system: Secondary | ICD-10-CM

## 2012-03-01 HISTORY — PX: ANGIOPLASTY: SHX39

## 2012-03-01 HISTORY — DX: Adverse effect of unspecified anesthetic, initial encounter: T41.45XA

## 2012-03-01 HISTORY — DX: Other complications of anesthesia, initial encounter: T88.59XA

## 2012-03-01 HISTORY — DX: Anxiety disorder, unspecified: F41.9

## 2012-03-01 HISTORY — DX: Cardiac arrhythmia, unspecified: I49.9

## 2012-03-01 LAB — POCT I-STAT, CHEM 8
Glucose, Bld: 179 mg/dL — ABNORMAL HIGH (ref 70–99)
HCT: 41 % (ref 36.0–46.0)
Hemoglobin: 13.9 g/dL (ref 12.0–15.0)
Potassium: 3.5 mEq/L (ref 3.5–5.1)
Sodium: 140 mEq/L (ref 135–145)

## 2012-03-01 LAB — POCT ACTIVATED CLOTTING TIME
Activated Clotting Time: 252 seconds
Activated Clotting Time: 192 seconds

## 2012-03-01 SURGERY — ANGIOGRAM EXTREMITY BILATERAL
Laterality: Right

## 2012-03-01 MED ORDER — IRBESARTAN 150 MG PO TABS
150.0000 mg | ORAL_TABLET | Freq: Every day | ORAL | Status: DC
  Filled 2012-03-01 (×2): qty 1

## 2012-03-01 MED ORDER — VITAMIN D3 25 MCG (1000 UNIT) PO TABS
1000.0000 [IU] | ORAL_TABLET | Freq: Every day | ORAL | Status: DC
  Filled 2012-03-01: qty 1

## 2012-03-01 MED ORDER — LABETALOL HCL 5 MG/ML IV SOLN
10.0000 mg | INTRAVENOUS | Status: DC | PRN
  Filled 2012-03-01: qty 4

## 2012-03-01 MED ORDER — PANTOPRAZOLE SODIUM 40 MG PO TBEC: 40.0000 mg | DELAYED_RELEASE_TABLET | Freq: Every day | ORAL | Status: DC

## 2012-03-01 MED ORDER — ASPIRIN 325 MG PO TABS
325.0000 mg | ORAL_TABLET | Freq: Every day | ORAL | Status: DC
  Filled 2012-03-01: qty 1

## 2012-03-01 MED ORDER — LIDOCAINE HCL (PF) 1 % IJ SOLN
INTRAMUSCULAR | Status: AC
  Filled 2012-03-01: qty 30

## 2012-03-01 MED ORDER — PHENOL 1.4 % MT LIQD: 1.0000 | OROMUCOSAL | Status: DC | PRN

## 2012-03-01 MED ORDER — POTASSIUM CHLORIDE CRYS ER 20 MEQ PO TBCR
20.0000 meq | EXTENDED_RELEASE_TABLET | Freq: Once | ORAL | Status: AC
  Administered 2012-03-01: 20 meq via ORAL
  Filled 2012-03-01: qty 2

## 2012-03-01 MED ORDER — GABAPENTIN 100 MG PO CAPS
100.0000 mg | ORAL_CAPSULE | Freq: Every day | ORAL | Status: DC
  Filled 2012-03-01: qty 1

## 2012-03-01 MED ORDER — DORZOLAMIDE HCL 2 % OP SOLN
1.0000 [drp] | Freq: Two times a day (BID) | OPHTHALMIC | Status: DC
  Filled 2012-03-01: qty 10

## 2012-03-01 MED ORDER — GUAIFENESIN-DM 100-10 MG/5ML PO SYRP: 15.0000 mL | ORAL_SOLUTION | ORAL | Status: DC | PRN

## 2012-03-01 MED ORDER — CALCIUM-MAGNESIUM-ZINC 333-133-5 MG PO TABS: 1.0000 | ORAL_TABLET | Freq: Every day | ORAL | Status: DC

## 2012-03-01 MED ORDER — SODIUM CHLORIDE 0.9 % IV SOLN
INTRAVENOUS | Status: DC
  Administered 2012-03-01: 1000 mL via INTRAVENOUS

## 2012-03-01 MED ORDER — PREDNISONE 50 MG PO TABS
50.0000 mg | ORAL_TABLET | Freq: Every day | ORAL | Status: DC
  Administered 2012-03-02: 50 mg via ORAL
  Filled 2012-03-01 (×2): qty 1

## 2012-03-01 MED ORDER — ONDANSETRON HCL 4 MG/2ML IJ SOLN: 4.0000 mg | Freq: Four times a day (QID) | INTRAMUSCULAR | Status: DC | PRN

## 2012-03-01 MED ORDER — VALSARTAN-HYDROCHLOROTHIAZIDE 160-12.5 MG PO TABS: 1.0000 | ORAL_TABLET | Freq: Every day | ORAL | Status: DC

## 2012-03-01 MED ORDER — METOPROLOL TARTRATE 1 MG/ML IV SOLN: 2.0000 mg | INTRAVENOUS | Status: DC | PRN

## 2012-03-01 MED ORDER — HYDROCHLOROTHIAZIDE 12.5 MG PO CAPS
12.5000 mg | ORAL_CAPSULE | Freq: Every day | ORAL | Status: DC
  Filled 2012-03-01 (×2): qty 1

## 2012-03-01 MED ORDER — LOTEPREDNOL ETABONATE 0.5 % OP SUSP
1.0000 [drp] | Freq: Every day | OPHTHALMIC | Status: DC
  Filled 2012-03-01: qty 5

## 2012-03-01 MED ORDER — ACETAMINOPHEN 650 MG RE SUPP: 325.0000 mg | RECTAL | Status: DC | PRN

## 2012-03-01 MED ORDER — SODIUM CHLORIDE 0.9 % IV SOLN
1.0000 mL/kg/h | INTRAVENOUS | Status: DC
  Administered 2012-03-02: 1 mL/kg/h via INTRAVENOUS

## 2012-03-01 MED ORDER — CLOPIDOGREL BISULFATE 300 MG PO TABS
ORAL_TABLET | ORAL | Status: AC
  Filled 2012-03-01: qty 1

## 2012-03-01 MED ORDER — ACETAMINOPHEN 325 MG PO TABS: 650.0000 mg | ORAL_TABLET | ORAL | Status: DC | PRN

## 2012-03-01 MED ORDER — HEPARIN SODIUM (PORCINE) 1000 UNIT/ML IJ SOLN
INTRAMUSCULAR | Status: AC
  Filled 2012-03-01: qty 1

## 2012-03-01 MED ORDER — ALPRAZOLAM 0.25 MG PO TABS
0.1250 mg | ORAL_TABLET | Freq: Every evening | ORAL | Status: DC | PRN
  Administered 2012-03-01: 20:00:00 0.125 mg via ORAL
  Filled 2012-03-01: qty 1

## 2012-03-01 MED ORDER — AMLODIPINE BESYLATE 10 MG PO TABS
10.0000 mg | ORAL_TABLET | Freq: Every day | ORAL | Status: DC
  Filled 2012-03-01 (×2): qty 1

## 2012-03-01 MED ORDER — ACETYLCARN-ALPHA LIPOIC ACID 400-200 MG PO CAPS: 2.0000 | ORAL_CAPSULE | Freq: Every day | ORAL | Status: DC

## 2012-03-01 MED ORDER — ACETAMINOPHEN 325 MG PO TABS: 325.0000 mg | ORAL_TABLET | ORAL | Status: DC | PRN

## 2012-03-01 MED ORDER — CLOPIDOGREL BISULFATE 75 MG PO TABS: 75.0000 mg | ORAL_TABLET | Freq: Every day | ORAL | Status: DC

## 2012-03-01 MED ORDER — HYDRALAZINE HCL 20 MG/ML IJ SOLN
10.0000 mg | INTRAMUSCULAR | Status: DC | PRN
  Filled 2012-03-01: qty 0.5

## 2012-03-01 MED ORDER — ALISKIREN FUMARATE 150 MG PO TABS: 225.0000 mg | ORAL_TABLET | Freq: Every day | ORAL | Status: DC

## 2012-03-01 MED ORDER — DIPHENHYDRAMINE HCL 50 MG PO CAPS
50.0000 mg | ORAL_CAPSULE | Freq: Once | ORAL | Status: DC
  Filled 2012-03-01: qty 1

## 2012-03-01 MED ORDER — CALCIUM CARBONATE 600 MG PO TABS
600.0000 mg | ORAL_TABLET | Freq: Every day | ORAL | Status: DC
  Administered 2012-03-01: 18:00:00 600 mg via ORAL
  Filled 2012-03-01 (×2): qty 1

## 2012-03-01 MED ORDER — SENNA 8.6 MG PO TABS
1.0000 | ORAL_TABLET | Freq: Two times a day (BID) | ORAL | Status: DC
  Filled 2012-03-01 (×3): qty 1

## 2012-03-01 MED ORDER — TRAMADOL HCL 50 MG PO TABS: 50.0000 mg | ORAL_TABLET | Freq: Four times a day (QID) | ORAL | Status: DC | PRN

## 2012-03-01 NOTE — Progress Notes (Signed)
DR EARLY NOTIFIED OF CLIENT WITH BLEEDING AFTER PROCEDURE AND PRESSURE REHELD IN CATH LAB AND THEN OOZING NOTED IN SHORT STAY AND PRESSURE HELD AND DR EARLY WILL BE IN

## 2012-03-01 NOTE — Progress Notes (Signed)
Patient ID: Regina Hamilton, female   DOB: 20-May-1923, 79 y.o.   MRN: [id number] Asked to see Regina Hamilton in short stay due to oozing from her groin. She has not been up to walk. She is saturated her dressing on 2 occasions.  On physical exam her feet are well-perfused she does have Doppler flow in both feet. Her left groin puncture site has a dry gauze dressing over it and there is no bleeding currently.  I spoke with the patient and both of her daughters present. We'll watch her in observation overnight and ambulate her later on this evening. We'll plan on discharge tomorrow.  No evidence of significant hematoma and no evidence of false aneurysm.

## 2012-03-01 NOTE — Interval H&P Note (Signed)
History and Physical Interval Note:  03/01/2012 7:36 AM  Regina Hamilton  has presented today for surgery, with the diagnosis of PVD  The various methods of treatment have been discussed with the patient and family. After consideration of risks, benefits and other options for treatment, the patient has consented to  Procedure(s) (LRB) with comments: ABDOMINAL AORTAGRAM (N/A) as a surgical intervention .  The patient's history has been reviewed, patient examined, no change in status, stable for surgery.  I have reviewed the patient's chart and labs.  Questions were answered to the patient's satisfaction.     Rainey Kahrs S

## 2012-03-01 NOTE — Op Note (Signed)
PATIENT: Regina Hamilton   MRN: [id number] DOB: October 02, 1923    DATE OF PROCEDURE: 03/01/2012  INDICATIONS: Regina Hamilton is 79 y.o. female with a history of significant infrainguinal arterial occlusive disease. She developed progressive rest pain of her right foot. She comes in for diagnostic arteriography and also possible superficial femoral artery angioplasty.  PROCEDURE:  1. Ultrasound-guided access to the left common femoral artery 2. Selective catheterization of the right common iliac artery with right lower extremity runoff. 3. Selective catheterization of the right superficial femoral artery. 4. Angioplasty of the right superficial femoral artery with a free millimeters by 4 cm balloon and then with a 4 mm x 4 cm balloon. 5. Retrograde left femoral arteriogram with left lower extremity runoff  SURGEON: Di Kindle. Edilia Bo, MD, FACS  ANESTHESIA: local   EBL: minimal  TECHNIQUE: The patient was brought to the peripheral vascular lab. Both groins were prepped and draped in usual sterile fashion. Under ultrasound guidance, and after the skin was anesthetized, the left common femoral artery was cannulated just above her left femoropopliteal bypass graft. A guidewire was introduced into the infrarenal aorta and a 5 French sheath introduced over the wire. The crossover catheter was positioned into the right common iliac artery. An angled Glidewire was advanced into the external iliac artery and selective right external iliac arteriogram is obtained with right lower extreme runoff. The patient had a 99% stenosis in the distal superficial femoral artery. I elected to address this with balloon angioplasty. The 5 French sheath was exchanged for a long 5 [place] sheath which was crossed over the bifurcation. I then was able to get an angled Glidewire through the stenosis in the right superficial femoral artery using a quick cross catheter for support. Initially a 3 mm x 4 cm balloon was selected and  positioned across the stenosis and inflated to 8 atmospheres for 60 seconds. Completion films showed some mild residual stenosis the balloon appeared to be slightly undersized. I therefore selected a 4 mm x 4 cm balloon. This was inflated to 6 atmospheres for 60 seconds. Both of these inflations were done over a 0.014 wire. Completion films showed no residual stenosis.  An additional film was obtained the right groin to demonstrate patency of the common femoral artery. The hip prosthesis and obscured the view on the original view. Next left lower extremity runoff films were obtained to the left femoral sheath.  FINDINGS:  1. The right common iliac artery, external iliac artery, and hypogastric arteries are patent. The right common femoral deep femoral and superficial femoral arteries are patent proximally. Superficial femoral arteries small with some mild diffuse disease with a focal 99% stenosis of the distal superficial femoral artery above the knee. Below knee pop 2 artery has mild diffuse disease. Her severe tibial occlusive disease on the right. The anterior tibial artery is occluded and the posterior tibial artery is occluded. Hernial arteries occluded proximally but reconstitutes distally in his small with mild diffuse disease. The right superficial femoral artery stenosis was successfully ballooned as described above. 2. On the left side the common iliac and external iliac arteries are patent. The common femoral and deep from arteries are patent. The superficial femoral artery is occluded at its origin. The left femoral to above-knee popliteal artery bypass graft is patent. Popped artery is patent and is single vessel runoff via a diseased peroneal artery on the left. The anterior tibial and posterior tibial arteries in the left are occluded.  Waverly Ferrari, MD, FACS  [facility]  DATE OF DICTATION:   03/01/2012

## 2012-03-01 NOTE — Progress Notes (Signed)
Utilization Review Completed.   Shahana Capes, RN, BSN Nurse Case Manager  [phone]  

## 2012-03-01 NOTE — H&P (View-Only) (Signed)
[facility]ist of [place]  Patient name: Regina Hamilton MRN: [id number] DOB: 1923/02/22 Sex: female  REASON FOR VISIT: pain in right foot.  HPI: Regina Hamilton is a 79 y.o. female who has undergone previous PTA of a right superficial femoral artery stenosis on 11/10/2011. She had presented with progressive ischemia of her right lower extremity. She had a tight stenosis of the right superficial femoral artery and also severe tibial artery occlusive disease. Given her age she is really not a candidate for a fem-fem to distal peroneal artery bypass graft and I performed PTA of the right superficial femoral artery using a 4 mm x 4 cm Angio-Seal cutting balloon. She had good results from this and had a palpable popliteal pulse on her first follow up visit. She returns today complaining of some pain in her right foot and has some reddish discoloration of the forefoot. He describes rest pain of the right foot. She also has claudication of the right leg.   REVIEW OF SYSTEMS: Arly.Keller ] denotes positive finding; [  ] denotes negative finding  CARDIOVASCULAR:  [ ]  chest pain   [ ]  dyspnea on exertion    CONSTITUTIONAL:  [ ]  fever   [ ]  chills  PHYSICAL EXAM: Filed Vitals:   02/25/12 1547  BP: 172/53  Pulse: 69  Height: 5\' 2"  (1.575 m)  Weight: 125 lb (56.7 kg)  SpO2: 99%   Body mass index is 22.86 kg/(m^2). GENERAL: The patient is a well-nourished female, in no acute distress. The vital signs are documented above. CARDIOVASCULAR: There is a regular rate and rhythm  PULMONARY: There is good air exchange bilaterally without wheezing or rales. I am unable to palpate a popliteal pulse on the right. The right foot appears chronically ischemic.  MEDICAL ISSUES: I think she has likely had progression of her superficial femoral artery occlusive disease on the right or occlusion of the superficial femoral artery. Think this could quickly become a limb threatening problem. I have recommended that we  proceed with arteriography to evaluate her for possible repeat endovascular intervention on the right. She does have a dye allergy and we will premedicate her for this.I have reviewed with the patient the indications for arteriography. In addition, I have reviewed the potential complications of arteriography including but not limited to: Bleeding, arterial injury, arterial thrombosis, dye action, renal insufficiency, or other unpredictable medical problems. I have explained to the patient that if we find disease amenable to angioplasty we could potentially address this at the same time. I have discussed the potential complications of angioplasty and stenting, including but not limited to: Bleeding, arterial thrombosis, arterial injury, dissection, or the need for surgical intervention. We will make further recommendations pending the results of her arteriogram.   DICKSON,CHRISTOPHER S [facility] of [facility] Beeper: [phone]

## 2012-03-01 NOTE — Telephone Encounter (Addendum)
Message copied by Rosalyn Charters on Mon Mar 01, 2012 11:41 AM ------      Message from: Melene Plan      Created: Mon Mar 01, 2012  9:29 AM      Regarding: FW: charge and follow up                   ----- Message -----         From: Chuck Hint, MD         Sent: 03/01/2012   9:08 AM           To: Reuel Derby, Melene Plan, RN      Subject: charge and follow up                                     PROCEDURE:       1. Ultrasound-guided access to the left common femoral artery      2. Selective catheterization of the right common iliac artery with right lower extremity runoff.      3. Selective catheterization of the right superficial femoral artery.      4. Angioplasty of the right superficial femoral artery with a free millimeters by 4 cm balloon and then with a 4 mm x 4 cm balloon.      5. Retrograde left femoral arteriogram with left lower extremity runoff            SURGEON: Di Kindle. Edilia Bo, MD, FACS            This patient needs a follow up visit in 3 weeks with ABIs. Thank you. CSD  l/v/m notifying patient of fu appt. with csd on 03-24-12 at 10am as per staff message 03-01-12

## 2012-03-01 NOTE — Progress Notes (Signed)
DR EARLY IN

## 2012-03-01 NOTE — Progress Notes (Signed)
Daughters and patient state she has had 3 doses of prednisone and one dose of benadryl per order from the MD

## 2012-03-01 NOTE — Progress Notes (Signed)
REPORT CALLED AND TRANSFERRED TO [id number] VIA BED

## 2012-03-02 LAB — CBC
MCH: 28.8 pg (ref 26.0–34.0)
Platelets: 181 10*3/uL (ref 150–400)
RBC: 3.82 MIL/uL — ABNORMAL LOW (ref 3.87–5.11)
WBC: 13.2 10*3/uL — ABNORMAL HIGH (ref 4.0–10.5)

## 2012-03-02 MED ORDER — TRAMADOL HCL 50 MG PO TABS: 50.0000 mg | ORAL_TABLET | Freq: Four times a day (QID) | ORAL | Status: DC | PRN

## 2012-03-02 NOTE — Discharge Summary (Signed)
Agree with plans for D/C.  Cari Caraway Beeper [phone] 03/02/2012

## 2012-03-02 NOTE — Discharge Summary (Signed)
V[facility]in Specialists Discharge Summary  Regina Hamilton 1923-05-30 79 y.o. female  [id number]  Admission Date: 03/01/2012  Discharge Date: 03/02/12  Physician: Regina Hint, MD  Admission Diagnosis: PVD   HPI:   This is a 79 y.o. female who has undergone previous PTA of a right superficial femoral artery stenosis on 11/10/2011. She had presented with progressive ischemia of her right lower extremity. She had a tight stenosis of the right superficial femoral artery and also severe tibial artery occlusive disease. Given her age she is really not a candidate for a fem-fem to distal peroneal artery bypass graft and I performed PTA of the right superficial femoral artery using a 4 mm x 4 cm Angio-Seal cutting balloon. She had good results from this and had a palpable popliteal pulse on her first follow up visit. She returns today complaining of some pain in her right foot and has some reddish discoloration of the forefoot. He describes rest pain of the right foot. She also has claudication of the right leg.   Hospital Course:  The patient was admitted to the hospital and taken to the operating room on 03/01/2012 and underwent  1. Ultrasound-guided access to the left common femoral artery  2. Selective catheterization of the right common iliac artery with right lower extremity runoff.  3. Selective catheterization of the right superficial femoral artery.  4. Angioplasty of the right superficial femoral artery with a free millimeters by 4 cm balloon and then with a 4 mm x 4 cm balloon.  5. Retrograde left femoral arteriogram with left lower extremity runoff     The pt tolerated the procedure well and was transported to the PACU in good condition.   Dr. Arbie Hamilton was Asked to see Regina Hamilton in short stay due to oozing from her groin. She has not been up to walk. She is saturated her dressing on 2 occasions.  On physical exam her feet are well-perfused she does have Doppler flow in  both feet. Her left groin puncture site has a dry gauze dressing over it and there is no bleeding currently.  I spoke with the patient and both of her daughters present. We'll watch her in observation overnight and ambulate her later on this evening. We'll plan on discharge tomorrow.  No evidence of significant hematoma and no evidence of false aneurysm.  By POD 1, the pt is doing well and discharged home.  The remainder of the hospital course consisted of increasing mobilization and increasing intake of solids without difficulty.  CBC    Component Value Date/Time   WBC 13.2* 03/02/2012 0440   RBC 3.82* 03/02/2012 0440   HGB 11.0* 03/02/2012 0440   HCT 32.5* 03/02/2012 0440   PLT 181 03/02/2012 0440   MCV 85.1 03/02/2012 0440   MCH 28.8 03/02/2012 0440   MCHC 33.8 03/02/2012 0440   RDW 13.7 03/02/2012 0440   LYMPHSABS 2.9 03/27/2010 1400   MONOABS 0.7 03/27/2010 1400   EOSABS 0.1 03/27/2010 1400   BASOSABS 0.0 03/27/2010 1400    BMET    Component Value Date/Time   NA 140 03/01/2012 0605   K 3.5 03/01/2012 0605   CL 104 03/01/2012 0605   CO2 30 10/01/2010 1157   GLUCOSE 179* 03/01/2012 0605   BUN 32* 03/01/2012 0605   CREATININE 1.10 03/01/2012 0605   CALCIUM 10.1 10/01/2010 1157   GFRNONAA >60 10/01/2010 1157   GFRAA >60 10/01/2010 1157     Discharge Instructions:   The patient  is discharged to home with extensive instructions on wound care and progressive ambulation.  They are instructed not to drive or perform any heavy lifting until returning to see the physician in his office.  Discharge Orders    Future Appointments: Provider: Department: Dept Phone: Center:   03/24/2012 10:00 AM [facility]-Lab Lab 5 [facility] -[place] [phone] [facility]   03/24/2012 10:30 AM Regina Hint, MD [facility] -[facility] [phone] V[facility]   05/26/2012 8:30 AM [facility]Lab Lab 4 [facility] -[place] [phone] [facility]   05/26/2012 9:00 AM [facility]Lab Lab 4 [facility] -[place] [phone] VVS   05/26/2012 10:00 AM Regina Hint, MD [facility] -[facility] [phone] VVS     Future Orders Please Complete By Expires   Resume previous diet      Call MD for:  temperature >100.5      Call MD for:  redness, tenderness, or signs of infection (pain, swelling, bleeding, redness, odor or green/yellow discharge around incision site)      Call MD for:  severe or increased pain, loss or decreased feeling  in affected limb(s)      may wash over wound with mild soap and water         Discharge Diagnosis:  PVD  Secondary Diagnosis: Patient Active Problem List  Diagnosis  . OTHER AND UNSPECIFIED HYPERLIPIDEMIA  . ANEMIA, SECONDARY TO ACUTE BLOOD LOSS  . LABYRINTHITIS, CHRONIC  . HYPERTENSION  . ATRIAL FIBRILLATION  . OSTEOARTHRITIS  . OTHER MALAISE AND FATIGUE  . FRACTURE, WRIST, RIGHT  . CAROTID ARTERY STENOSIS, BILATERAL  . UNSPECIFIED PERIPHERAL VASCULAR DISEASE  . Atherosclerosis of native arteries of the extremities with intermittent claudication  . Cholelithiasis  . Pacemaker-[employer]   Past Medical History  Diagnosis Date  . Peripheral vascular disease   . Carotid artery occlusion   . Hypertension   . Hyperlipidemia   . Arthritis   . Pacemaker 2008    [facility] in [place], [place]  . Complication of anesthesia   . Dysrhythmia     atrial fibrilation  . Anxiety   . GERD (gastroesophageal reflux disease)       Regina Hamilton  Home Medication Instructions [id number]   Printed on:03/02/12 3295  Medication Information                    ALPRAZolam (XANAX) 0.25 MG tablet Take 0.125 mg by mouth at bedtime as needed. Takes 1/2 For insomnia/anxiety           dorzolamide (TRUSOPT) 2 % ophthalmic solution Place 1 drop into the left eye 2 (two) times daily.            loteprednol (LOTEMAX) 0.5 % ophthalmic suspension Place 1 drop into both eyes daily.           aliskiren  (TEKTURNA) 150 MG tablet Take 225 mg by mouth daily.            amLODipine (NORVASC) 10 MG tablet Take 10 mg by mouth daily.           aspirin 325 MG tablet Take 325 mg by mouth daily.           valsartan-hydrochlorothiazide (DIOVAN-HCT) 160-12.5 MG per tablet Take 1 tablet by mouth daily.           gabapentin (NEURONTIN) 100 MG capsule Take 100 mg by mouth daily.  Acetylcarn-Alpha Lipoic Acid 400-200 MG CAPS Take 2 tablets by mouth daily.            calcium carbonate (CALCIUM 600 HIGH POTENCY) 600 MG TABS Take 600 mg by mouth daily.           cholecalciferol (VITAMIN D) 1000 UNITS tablet Take 1,000 Units by mouth daily.           Calcium-Magnesium-Zinc 333-133-5 MG TABS Take 1 tablet by mouth daily.           omeprazole (PRILOSEC) 20 MG capsule Take 20 mg by mouth daily.           traMADol (ULTRAM) 50 MG tablet Take 1 tablet (50 mg total) by mouth every 6 (six) hours as needed for pain.           predniSONE (DELTASONE) 50 MG tablet Take one tablet at 6:30 PM 02/29/12; take one tablet at 12:30 AM 03/01/12; take one tablet at 6:30 AM 03/01/12           diphenhydrAMINE (BENADRYL) 50 MG capsule Take 50 mg by mouth once. To prevent allergy to Contrast Dye.           clopidogrel (PLAVIX) 75 MG tablet Take 1 tablet (75 mg total) by mouth daily.             Disposition: home  Patient's condition: is Good  Follow up: 1. Dr. Edilia Bo in 3 weeks with ABI's   Doreatha Massed, PA-C [facility] [phone] 03/02/2012  7:39 AM

## 2012-03-02 NOTE — Progress Notes (Signed)
[facility] Vein Specialists of [place]  Subjective  - POD #1  Regina Hamilton is a 79 y.o. female who has undergone previous PTA of a right superficial femoral artery stenosis on 11/10/2011. She had presented with progressive ischemia of her right lower extremity. She had a tight stenosis of the right superficial femoral artery and also severe tibial artery occlusive disease. Given her age she is really not a candidate for a fem-fem to distal peroneal artery bypass graft and I performed PTA of the right superficial femoral artery using a 4 mm x 4 cm Angio-Seal cutting balloon. She had good results from this and had a palpable popliteal pulse on her first follow up visit. She returns today complaining of some pain in her right foot and has some reddish discoloration of the forefoot. He describes rest pain of the right foot. She also has claudication of the right leg.   PROCEDURE:  1. Ultrasound-guided access to the left common femoral artery  2. Selective catheterization of the right common iliac artery with right lower extremity runoff.  3. Selective catheterization of the right superficial femoral artery.  4. Angioplasty of the right superficial femoral artery with a free millimeters by 4 cm balloon and then with a 4 mm x 4 cm balloon.  5. Retrograde left femoral arteriogram with left lower extremity runoff    Objective 142/48 70 98 F (36.7 C) (Oral) 18 94%  Intake/Output Summary (Last 24 hours) at 03/02/12 0845 Last data filed at 03/02/12 0600  Gross per 24 hour  Intake  979.2 ml  Output    100 ml  Net  879.2 ml   PE: feet are well-perfused she does have Doppler flow in both feet Left groin wound clean and dry   Assessment/Planning: POD #1 Angioplasty of the right superficial femoral artery with a free millimeters by 4 cm balloon and then with a 4 mm x 4 cm balloon. Discharge home    Clinton Gallant [facility] 03/02/2012 8:45 AM --  Laboratory Lab Results:  Basename  03/02/12 0440 03/01/12 0605  WBC 13.2* --  HGB 11.0* 13.9  HCT 32.5* 41.0  PLT 181 --   BMET  Basename 03/01/12 0605  NA 140  K 3.5  CL 104  CO2 --  GLUCOSE 179*  BUN 32*  CREATININE 1.10  CALCIUM --    COAG Lab Results  Component Value Date   INR 0.92 03/27/2010   INR 0.95 06/20/2009   INR 1.0 09/28/2007   No results found for this basename: PTT    Antibiotics Anti-infectives    None

## 2012-03-04 ENCOUNTER — Telehealth: Payer: Self-pay

## 2012-03-04 NOTE — Telephone Encounter (Signed)
Daughter called on pt's behalf.  Reported pt. Has "a little swelling in the right foot, on the top and sides of the foot".  States that she has had swelling in both feet prior to the procedure on the 13th, but wanted to ask if this was to be expected after the procedure.  States her mother "walked quite a lot yesterday", and that the swelling had improved overnight.  Denied any swelling in the lower leg; stated the swelling didn't go into the ankle area, only top and sides of the right foot. Denied that pt. Had pain with standing/bearing weight.  Denied increase in sodium intake.  Advised daughter that swelling from the angioplasty was not typical, and to continue to monitor, elevate above level of heart at intervals, and to report to PCP if bilateral swelling of feet continues.  Also advised to call the vascular office back if other concerns post-procedure.  Verb. Understanding.

## 2012-03-23 ENCOUNTER — Encounter: Payer: Self-pay | Admitting: Vascular Surgery

## 2012-03-24 ENCOUNTER — Ambulatory Visit (INDEPENDENT_AMBULATORY_CARE_PROVIDER_SITE_OTHER): Payer: Medicare Other | Admitting: Vascular Surgery

## 2012-03-24 ENCOUNTER — Other Ambulatory Visit: Payer: Self-pay | Admitting: *Deleted

## 2012-03-24 ENCOUNTER — Encounter (INDEPENDENT_AMBULATORY_CARE_PROVIDER_SITE_OTHER): Payer: Medicare Other | Admitting: *Deleted

## 2012-03-24 ENCOUNTER — Encounter: Payer: Self-pay | Admitting: Vascular Surgery

## 2012-03-24 VITALS — BP 136/46 | HR 70 | Ht 62.0 in | Wt 128.8 lb

## 2012-03-24 DIAGNOSIS — I70219 Atherosclerosis of native arteries of extremities with intermittent claudication, unspecified extremity: Secondary | ICD-10-CM

## 2012-03-24 DIAGNOSIS — Z48816 Encounter for surgical aftercare following surgery on the genitourinary system: Secondary | ICD-10-CM

## 2012-03-24 DIAGNOSIS — I739 Peripheral vascular disease, unspecified: Secondary | ICD-10-CM

## 2012-03-24 DIAGNOSIS — Z48812 Encounter for surgical aftercare following surgery on the circulatory system: Secondary | ICD-10-CM

## 2012-03-24 NOTE — Assessment & Plan Note (Signed)
This patient underwent balloon angioplasty of the right superficial femoral artery and 03/01/2012 and has had a good result. Her foot feels much better and she is ambulating much more now. Dr. Patsy Lager, started her on Plavix. She has a palpable popliteal pulse and the foot looks much better. I've encouraged her to continue to ambulate as much as possible. She is due for a carotid duplex scan in April and when she comes in April we will also scan her right superficial femoral artery. She knows to call sooner if she has problems.

## 2012-03-24 NOTE — Progress Notes (Signed)
[facility]ascular and Vein Specialist of [place]  Patient name: Regina Hamilton MRN: [id number] DOB: 1923-03-04 Sex: female  REASON FOR VISIT: follow up after angioplasty of the right superficial femoral artery.   HPI: Regina Hamilton is a 79 y.o. female who developed progressive rest pain of her right foot. She underwent angioplasty of the right superficial femoral artery with a 4 mm x 4 cm balloon with a good result. She comes in for a follow up visit. She states that her claudication symptoms in the right leg have resolved and she is no longer having rest pain in the right leg. She has been much more active since her procedure. She has no other specific complaints.   REVIEW OF SYSTEMS: Arly.Keller ] denotes positive finding; [  ] denotes negative finding  CARDIOVASCULAR:  [ ]  chest pain   [ ]  dyspnea on exertion    CONSTITUTIONAL:  [ ]  fever   [ ]  chills  PHYSICAL EXAM: Filed Vitals:   03/24/12 1049  BP: 136/46  Pulse: 70  Height: 5\' 2"  (1.575 m)  Weight: 128 lb 12.8 oz (58.423 kg)  SpO2: 100%   Body mass index is 23.56 kg/(m^2). GENERAL: The patient is a well-nourished female, in no acute distress. The vital signs are documented above. CARDIOVASCULAR: There is a regular rate and rhythm. She has palpable oral and popliteal pulses bilaterally. Both feet are warm and well-perfused.  PULMONARY: There is good air exchange bilaterally without wheezing or rales.  ABIs could not be obtained because her vessels are calcific. However she has brisk monophasic signals in the right foot in the dorsalis pedis and posterior tibial positions. She has a biphasic left posterior tibial signal and a monophasic left dorsalis pedis signal.  MEDICAL ISSUES:  Atherosclerosis of native arteries of the extremities with intermittent claudication This patient underwent balloon angioplasty of the right superficial femoral artery and 03/01/2012 and has had a good result. Her foot feels much better and she is ambulating much more  now. Dr. Patsy Lager, started her on Plavix. She has a palpable popliteal pulse and the foot looks much better. I've encouraged her to continue to ambulate as much as possible. She is due for a carotid duplex scan in April and when she comes in April we will also scan her right superficial femoral artery. She knows to call sooner if she has problems.   DICKSON,CHRISTOPHER S [facility] of  Beeper: [phone]

## 2012-04-06 ENCOUNTER — Encounter: Payer: Self-pay | Admitting: Vascular Surgery

## 2012-05-06 ENCOUNTER — Encounter: Payer: Medicare Other | Admitting: Cardiology

## 2012-05-11 ENCOUNTER — Ambulatory Visit (INDEPENDENT_AMBULATORY_CARE_PROVIDER_SITE_OTHER): Payer: Medicare Other | Admitting: Cardiology

## 2012-05-11 DIAGNOSIS — Z950 Presence of cardiac pacemaker: Secondary | ICD-10-CM

## 2012-05-11 DIAGNOSIS — Z9889 Other specified postprocedural states: Secondary | ICD-10-CM

## 2012-05-11 LAB — PACEMAKER DEVICE OBSERVATION
BRDY-0002RV: 70 {beats}/min
BRDY-0004RV: 130 {beats}/min

## 2012-05-11 NOTE — Progress Notes (Signed)
PPM check/device clinic visit. Ms. Perlow reports she is feeling well and has no complaints. Last seen by Dr. Graciela Husbands in Dec 2013. Normal VVI PPM function. She is PPM dependent. See PaceArt report.

## 2012-05-11 NOTE — Patient Instructions (Signed)
You have an scheduled appointment on 11/15/2012 with Gunnar Fusi and Belenda Cruise in the Device Clinic.

## 2012-05-25 ENCOUNTER — Encounter: Payer: Self-pay | Admitting: Vascular Surgery

## 2012-05-26 ENCOUNTER — Ambulatory Visit (INDEPENDENT_AMBULATORY_CARE_PROVIDER_SITE_OTHER): Payer: Medicare Other | Admitting: Vascular Surgery

## 2012-05-26 ENCOUNTER — Encounter (INDEPENDENT_AMBULATORY_CARE_PROVIDER_SITE_OTHER): Payer: Medicare Other | Admitting: *Deleted

## 2012-05-26 ENCOUNTER — Other Ambulatory Visit (INDEPENDENT_AMBULATORY_CARE_PROVIDER_SITE_OTHER): Payer: Medicare Other | Admitting: *Deleted

## 2012-05-26 ENCOUNTER — Encounter: Payer: Self-pay | Admitting: Vascular Surgery

## 2012-05-26 ENCOUNTER — Ambulatory Visit: Payer: Medicare Other | Admitting: Vascular Surgery

## 2012-05-26 DIAGNOSIS — I739 Peripheral vascular disease, unspecified: Secondary | ICD-10-CM

## 2012-05-26 DIAGNOSIS — Z48812 Encounter for surgical aftercare following surgery on the circulatory system: Secondary | ICD-10-CM

## 2012-05-26 DIAGNOSIS — I6529 Occlusion and stenosis of unspecified carotid artery: Secondary | ICD-10-CM

## 2012-05-26 DIAGNOSIS — I70219 Atherosclerosis of native arteries of extremities with intermittent claudication, unspecified extremity: Secondary | ICD-10-CM

## 2012-05-26 NOTE — Progress Notes (Addendum)
[facility]IN SPECIALISTS OF [place]  Established Intermittent Claudication  History of Present Illness  Regina Hamilton is a 79 y.o. (1923/08/27) female who presents with chief complaint: right calf cramps and digits feeling cold during ambulation.  She is also being followed for her carotid stenosis L> R.  The patient's symptoms have remained the same.  The patient's symptoms are: pain with ambulation greater than 100 feet that comes and goes.  If she stops to rest the pain gets better in her right calf and toes.   The patient's treatment regimen currently included: maximal medical management and Plavix and Asprin daily.  She stays active and walks daily.  Past Medical History, Past Surgical History, Social History, Family History, Medications, Allergies, and Review of Systems are unchanged from previous evaluation on 03-24-2012.  Physical Examination  Filed Vitals:   05/26/12 1042 05/26/12 1043  BP: 172/56 169/56  Pulse: 70   Height: 5\' 2"  (1.575 m)   Weight: 122 lb (55.339 kg)   SpO2: 100%    Body mass index is 22.31 kg/(m^2).  General: A&O x 3, WDWN this frame  Pulmonary: Sym exp, good air movt, CTAB, no rales, rhonchi, & wheezing  Cardiac: RRR, Nl S1, S2, no Murmurs, rubs or gallops  Vascular: Vessel Right Left  Radial Palpable Palpable  Ulnar    Brachial    Carotid Palpable, with bruit Palpable, with bruit  Aorta Not palpable N/A  Femoral Palpable Palpable  Popliteal Not palpable Not palpable  PT weak Palpable not Palpable  DP not Palpable not Palpable   Musculoskeletal: M/S 5/5 throughout , Extremities without ischemic changes.  She does have venous pooling in the plantar aspect of bilateral feet.  Neurologic: Pain and light touch intact in extremities grossly.  Non-Invasive Vascular Imaging Carotid Duplex 05-26-2012 Right   <40% Left  60-79%     ABI (Date: 05-26-2012)  RLE: 1.36 falsely high  LLE: 1.07  Right LE Duplex (Date: 05-26-2012)  Superficial  femoral artery is patent without evidence of stent failure.  Medical Decision Making  LOUCINDA Hamilton is a 79 y.o. female who presents with: right leg intermittent claudication without evidence of critical limb ischemia.  Based on the patient's vascular studies and examination, I have offered the patient: a 6 month follow up for repeat ABI'S and Right LE duplex, as well as Carotid duplex.  I discussed in depth with the patient the nature of atherosclerosis, and emphasized the importance of maximal medical management including strict control of blood pressure, blood glucose, and lipid levels, antiplatelet agents, obtaining regular exercise, and cessation of smoking.  The patient is aware that without maximal medical management the underlying atherosclerotic disease process will progress, limiting the benefit of any interventions.  Thank you for allowing [place] to participate in this patient's care.  Regina Hamilton [facility] PA- [facility] of [place] Office: [phone] Pager: [phone]  05/26/2012, 11:16 AM  Agree with above. The right foot appears well perfused. The stent in the right lower extremity appears to be patent. Carotid duplex scan shows a 60-79% left carotid stenosis with no significant stenosis on the right. She will be due for follow up studies in 6 months and I'll see her back at that time. She knows to call sooner if she has problems.  Waverly Ferrari, MD, FACS Beeper [phone] 05/26/2012

## 2012-05-27 NOTE — Addendum Note (Signed)
Addended by: Adria Dill L on: 05/27/2012 09:59 AM   Modules accepted: Orders

## 2012-06-03 ENCOUNTER — Telehealth: Payer: Self-pay

## 2012-06-03 NOTE — Telephone Encounter (Signed)
Daughter called to report pt. has "pitting edema of left leg and ankle".  States left leg is cooler than right, from mid-calf and down.  Describes (L) foot as "pale".  States faint pulse in left foot.  Describes petechiae in lower leg.  States pt. C/o having muscle cramps in lower legs; c/o soreness of in calves with palpation.  Reports that pt. has been walking a lot.  Denies any swelling in right lower extremity.  Saw Dr. Edilia Bo on 05/26/12, and had reported right calf cramps and pain w/ ambulation. Daughter denies that pt. has any SOB or fever.  Discussed w/ Dr. Darrick Penna.  Advised to continue to monitor symptoms, and go to ER if worsening symptoms.  Advised daughter to encourage pt. to elevate legs, intermittently, to reduce swelling.  Verb. Understanding.

## 2012-06-07 ENCOUNTER — Emergency Department (HOSPITAL_COMMUNITY)
Admission: EM | Admit: 2012-06-07 | Discharge: 2012-06-07 | Disposition: A | Discharge status: Home / Self Care | Payer: Medicare Other | Attending: Emergency Medicine | Admitting: Emergency Medicine

## 2012-06-07 ENCOUNTER — Encounter (HOSPITAL_COMMUNITY): Payer: Self-pay | Admitting: Emergency Medicine

## 2012-06-07 DIAGNOSIS — Z7902 Long term (current) use of antithrombotics/antiplatelets: Secondary | ICD-10-CM | POA: Insufficient documentation

## 2012-06-07 DIAGNOSIS — I10 Essential (primary) hypertension: Secondary | ICD-10-CM | POA: Insufficient documentation

## 2012-06-07 DIAGNOSIS — Z8679 Personal history of other diseases of the circulatory system: Secondary | ICD-10-CM | POA: Insufficient documentation

## 2012-06-07 DIAGNOSIS — Z7982 Long term (current) use of aspirin: Secondary | ICD-10-CM | POA: Insufficient documentation

## 2012-06-07 DIAGNOSIS — E871 Hypo-osmolality and hyponatremia: Secondary | ICD-10-CM | POA: Insufficient documentation

## 2012-06-07 DIAGNOSIS — Z8739 Personal history of other diseases of the musculoskeletal system and connective tissue: Secondary | ICD-10-CM | POA: Insufficient documentation

## 2012-06-07 DIAGNOSIS — M79609 Pain in unspecified limb: Secondary | ICD-10-CM

## 2012-06-07 DIAGNOSIS — Z79899 Other long term (current) drug therapy: Secondary | ICD-10-CM | POA: Insufficient documentation

## 2012-06-07 DIAGNOSIS — Z950 Presence of cardiac pacemaker: Secondary | ICD-10-CM | POA: Insufficient documentation

## 2012-06-07 DIAGNOSIS — M7989 Other specified soft tissue disorders: Secondary | ICD-10-CM

## 2012-06-07 DIAGNOSIS — L0390 Cellulitis, unspecified: Secondary | ICD-10-CM

## 2012-06-07 LAB — PROTIME-INR
INR: 1.03 (ref 0.00–1.49)
Prothrombin Time: 13.4 seconds (ref 11.6–15.2)

## 2012-06-07 LAB — CBC WITH DIFFERENTIAL/PLATELET
Basophils Absolute: 0 10*3/uL (ref 0.0–0.1)
Eosinophils Absolute: 0.3 10*3/uL (ref 0.0–0.7)
Eosinophils Relative: 3 % (ref 0–5)
Lymphocytes Relative: 22 % (ref 12–46)
Lymphs Abs: 2 10*3/uL (ref 0.7–4.0)
MCV: 85 fL (ref 78.0–100.0)
Neutrophils Relative %: 63 % (ref 43–77)
Platelets: 234 10*3/uL (ref 150–400)
RBC: 3.6 MIL/uL — ABNORMAL LOW (ref 3.87–5.11)
RDW: 14.8 % (ref 11.5–15.5)
WBC: 9 10*3/uL (ref 4.0–10.5)

## 2012-06-07 LAB — BASIC METABOLIC PANEL
CO2: 25 mEq/L (ref 19–32)
Calcium: 9.2 mg/dL (ref 8.4–10.5)
GFR calc non Af Amer: 61 mL/min — ABNORMAL LOW (ref >90)
Potassium: 3.3 mEq/L — ABNORMAL LOW (ref 3.5–5.1)
Sodium: 125 mEq/L — ABNORMAL LOW (ref 135–145)

## 2012-06-07 LAB — APTT: aPTT: 32 seconds (ref 24–37)

## 2012-06-07 MED ORDER — CEPHALEXIN 250 MG PO CAPS: 250.0000 mg | ORAL_CAPSULE | Freq: Four times a day (QID) | ORAL | Status: DC

## 2012-06-07 MED ORDER — CEPHALEXIN 250 MG PO CAPS
250.0000 mg | ORAL_CAPSULE | Freq: Once | ORAL | Status: AC
  Administered 2012-06-07: 250 mg via ORAL
  Filled 2012-06-07: qty 1

## 2012-06-07 NOTE — Progress Notes (Signed)
VASCULAR LAB PRELIMINARY  PRELIMINARY  PRELIMINARY  PRELIMINARY  Left lower extremity venous duplex completed.    Preliminary report:  Left:  No evidence of DVT, superficial thrombosis, or Baker's cyst.    Arjan Strohm, RVT 06/07/2012, 5:45 PM

## 2012-06-07 NOTE — ED Provider Notes (Signed)
History    CSN: [id number] Arrival date & time 06/07/12  1514 First MD Initiated Contact with Patient 06/07/12 1654      Chief Complaint  Patient presents with  . Leg Swelling    HPI The patient presents to the emergency room with complaints of lower extremity swelling. Patient does have history of peripheral vascular disease but she has never had swelling like this.  Over the last  day or 2 she's noted swelling in her lower extremities worse on the left.  The leg is erythematous and painful to the touch. She denies any fevers cough vomiting or diarrhea.  No CP or SOB.  Past Medical History  Diagnosis Date  . Peripheral vascular disease   . Carotid artery occlusion   . Hypertension   . Hyperlipidemia   . Arthritis   . Pacemaker 2008    [facility] in [place], [place]  . Complication of anesthesia   . Dysrhythmia     atrial fibrilation  . Anxiety   . GERD (gastroesophageal reflux disease)     Past Surgical History  Procedure Laterality Date  . Spine surgery  1970    Cervical fusion  . Eye surgery      Bilateral Cornea Transplants  . Joint replacement      Right & Left Total Hip   . Cardio ablation- pacemaker    . Pr vein bypass graft,aorto-fem-pop    . Total hip revision  04-08-2010  . Cholecystectomy  05/20/11  . Angioplasty  03/01/2012  . Insert / replace / remove pacemaker      Family History  Problem Relation Age of Onset  . Heart disease Mother     Heart disease before age 58  . Heart disease Father     Heat Diease before age 37  . Heart disease Maternal Grandmother   . Heart disease Paternal Grandmother     History  Substance Use Topics  . Smoking status: Never Smoker   . Smokeless tobacco: Never Used  . Alcohol Use: No    OB History   Grav Para Term Preterm Abortions TAB SAB Ect Mult Living                  Review of Systems  All other systems reviewed and are negative.    Allergies  Midazolam; Warfarin sodium; Alphagan p; Oxycodone  hcl; Clonidine derivatives; Contrast media; Diltiazem hcl; Iron; Meloxicam; and Moxifloxacin  Home Medications   Current Outpatient Rx  Name  Route  Sig  Dispense  Refill  . aliskiren (TEKTURNA) 150 MG tablet   Oral   Take 225 mg by mouth daily.          Marland Kitchen ALPRAZolam (XANAX) 0.25 MG tablet   Oral   Take 0.125 mg by mouth at bedtime as needed for sleep.          Marland Kitchen amLODipine (NORVASC) 10 MG tablet   Oral   Take 10 mg by mouth daily.         Marland Kitchen aspirin EC 81 MG tablet   Oral   Take 81 mg by mouth daily.         . B Complex-C-Folic Acid (STRESS FORMULA) TABS   Oral   Take 1 tablet by mouth daily.         . calcium-vitamin D (OSCAL WITH D) 500-200 MG-UNIT per tablet   Oral   Take 1 tablet by mouth daily.         Marland Kitchen  cholecalciferol (VITAMIN D) 1000 UNITS tablet   Oral   Take 2,000 Units by mouth daily.          . clopidogrel (PLAVIX) 75 MG tablet   Oral   Take 75 mg by mouth daily.         . dorzolamide (TRUSOPT) 2 % ophthalmic solution   Left Eye   Place 1 drop into the left eye 2 (two) times daily.          Marland Kitchen loteprednol (LOTEMAX) 0.5 % ophthalmic suspension   Both Eyes   Place 1 drop into both eyes daily.         . traMADol (ULTRAM) 50 MG tablet   Oral   Take 50 mg by mouth every 6 (six) hours as needed for pain.         . valsartan-hydrochlorothiazide (DIOVAN-HCT) 160-12.5 MG per tablet   Oral   Take 1 tablet by mouth daily.         . cephALEXin (KEFLEX) 250 MG capsule   Oral   Take 1 capsule (250 mg total) by mouth 4 (four) times daily.   28 capsule   0     BP 157/57  Pulse 84  Temp(Src) 98.5 F (36.9 C)  Resp 18  SpO2 97%  Physical Exam  Nursing note and vitals reviewed. Constitutional: She appears well-developed and well-nourished. No distress.  HENT:  Head: Normocephalic and atraumatic.  Right Ear: External ear normal.  Left Ear: External ear normal.  Eyes: Conjunctivae are normal. Right eye exhibits no discharge.  Left eye exhibits no discharge. No scleral icterus.  Neck: Neck supple. No tracheal deviation present.  Cardiovascular: Normal rate, regular rhythm and intact distal pulses.   Pulmonary/Chest: Effort normal and breath sounds normal. No stridor. No respiratory distress. She has no wheezes. She has no rales.  Abdominal: Soft. Bowel sounds are normal. She exhibits no distension. There is no tenderness. There is no rebound and no guarding.  Musculoskeletal: She exhibits edema and tenderness.  Edema of the left lower extremity greater than the right, mild erythema, no discrete calf tenderness, no lymphangitic streaking  Neurological: She is alert. She has normal strength. No sensory deficit. Cranial nerve deficit:  no gross defecits noted. She exhibits normal muscle tone. She displays no seizure activity. Coordination normal.  Skin: Skin is warm and dry. No rash noted.  Psychiatric: She has a normal mood and affect.    ED Course  Procedures (including critical care time) Vascular ultrasound: No DVT noted Labs Reviewed  CBC WITH DIFFERENTIAL - Abnormal; Notable for the following:    RBC 3.60 (*)    Hemoglobin 10.6 (*)    HCT 30.6 (*)    All other components within normal limits  BASIC METABOLIC PANEL - Abnormal; Notable for the following:    Sodium 125 (*)    Potassium 3.3 (*)    Chloride 91 (*)    Glucose, Bld 115 (*)    GFR calc non Af Amer 61 (*)    GFR calc Af Amer 71 (*)    All other components within normal limits  PROTIME-INR  APTT  URINALYSIS, ROUTINE W REFLEX MICROSCOPIC  URINALYSIS, ROUTINE W REFLEX MICROSCOPIC   No results found.   1. Cellulitis   2. Hyponatremia       MDM  I suspect the patient has cellulitis. She has no evidence of acute vascular emergency. There is no DVT noted on the ultrasound. I will start the patient on oral  antibiotics. I discussed admission with the patient and the family. She would prefer to go home. I do recommend close followup with her  primary care Dr. The family had requested a urinalysis because they noticed that the urine color appeared dark. This was ordered the results did not come back in a timely fashion. The family would rather followup with her doctor at this point. Dr. Debby Bud should be able to review the results when they go for followup.  Return as I will start her on Keflex which should be good coverage for urinary tract infection        Celene Kras, MD 06/07/12 [phone]

## 2012-06-07 NOTE — ED Notes (Signed)
US at bedside

## 2012-06-07 NOTE — ED Notes (Signed)
Pt has fever 99.6 after Asprin 1 hour ago, leg swelling and BP has been elevated today. BP 155/55 and usually runs in the 120's. Pt says lower legs hurt. Pitting edema to both legs the left is worse. Pt NAD.

## 2012-06-08 ENCOUNTER — Encounter: Payer: Self-pay | Admitting: Internal Medicine

## 2012-07-07 ENCOUNTER — Encounter: Payer: Self-pay | Admitting: Internal Medicine

## 2012-07-07 ENCOUNTER — Ambulatory Visit (INDEPENDENT_AMBULATORY_CARE_PROVIDER_SITE_OTHER): Payer: Medicare Other | Admitting: Internal Medicine

## 2012-07-07 VITALS — BP 140/52 | HR 76 | Temp 97.8°F | Resp 16 | Wt 121.0 lb

## 2012-07-07 DIAGNOSIS — I872 Venous insufficiency (chronic) (peripheral): Secondary | ICD-10-CM

## 2012-07-07 DIAGNOSIS — I4891 Unspecified atrial fibrillation: Secondary | ICD-10-CM

## 2012-07-07 DIAGNOSIS — I10 Essential (primary) hypertension: Secondary | ICD-10-CM

## 2012-07-07 DIAGNOSIS — R609 Edema, unspecified: Secondary | ICD-10-CM

## 2012-07-07 DIAGNOSIS — I739 Peripheral vascular disease, unspecified: Secondary | ICD-10-CM

## 2012-07-07 MED ORDER — AMLODIPINE BESYLATE 10 MG PO TABS: 5.0000 mg | ORAL_TABLET | Freq: Every day | ORAL | Status: DC

## 2012-07-07 MED ORDER — VASCULERA PO TABS: 1.0000 | ORAL_TABLET | Freq: Two times a day (BID) | ORAL | Status: DC

## 2012-07-07 NOTE — Patient Instructions (Signed)
Take Amlodipine 1/2 a day

## 2012-07-07 NOTE — Assessment & Plan Note (Signed)
BP Readings from Last 3 Encounters:  07/07/12 140/52  06/07/12 157/57  05/26/12 169/56

## 2012-07-07 NOTE — Assessment & Plan Note (Addendum)
Reduce Norvasc dose She had HCTZ removed 1 wk ago Try Vasculera Compr socks Elevate LE 5/14 she had doppler US: no DVT or pelvic compression

## 2012-07-07 NOTE — Progress Notes (Signed)
  Subjective:    Patient ID: Regina Hamilton, female    DOB: Jun 28, 1923, 79 y.o.   MRN: [id number]  HPI C/o B legs new swelling and tenderness on 4/21. Dr Lynelle Doctor gave her Keflex x 2-3 wks, then Doxy (still on it) - it did not help. C/o PVD, tingling, pain. No h/o CHF  BP Readings from Last 3 Encounters:  07/07/12 140/52  06/07/12 157/57  05/26/12 169/56   Wt Readings from Last 3 Encounters:  07/07/12 121 lb (54.885 kg)  05/26/12 122 lb (55.339 kg)  03/24/12 128 lb 12.8 oz (58.423 kg)     Review of Systems  Constitutional: Positive for fatigue. Negative for chills and unexpected weight change.  HENT: Positive for hearing loss. Negative for nosebleeds, congestion and sore throat.   Respiratory: Negative for cough, choking and wheezing.   Cardiovascular: Positive for leg swelling. Negative for chest pain and palpitations.  Gastrointestinal: Negative for nausea and abdominal pain.  Endocrine: Negative for polydipsia.  Genitourinary: Negative for flank pain, vaginal bleeding, vaginal discharge and pelvic pain.  Musculoskeletal: Positive for myalgias, arthralgias and gait problem. Negative for back pain and joint swelling.  Skin:       Redness B ankles, not warm or tender Toes are purplish B  Neurological: Negative for seizures and weakness.  Psychiatric/Behavioral: Negative for suicidal ideas and decreased concentration. The patient is not nervous/anxious.        Objective:   Physical Exam  Constitutional: She appears well-developed and well-nourished. No distress.  HENT:  Head: Normocephalic.  Nose: Nose normal.  Mouth/Throat: Oropharynx is clear and moist. No oropharyngeal exudate.  Eyes: Left eye exhibits no discharge. No scleral icterus.  Neck: No JVD present. No tracheal deviation present. No thyromegaly present.  Cardiovascular: Exam reveals no gallop and no friction rub.   Murmur heard. irreg  Pulmonary/Chest: She has no wheezes. She has no rales. She exhibits no  tenderness.  Abdominal: She exhibits no distension and no mass. There is no guarding.  Musculoskeletal: She exhibits edema. She exhibits no tenderness.  1+ B  Lymphadenopathy:    She has no cervical adenopathy.  Skin: No rash noted. There is erythema.  Redness B ankles, not warm or tender Toes are purplish B  Psychiatric: She has a normal mood and affect. Judgment normal.           Assessment & Plan:

## 2012-07-07 NOTE — Assessment & Plan Note (Signed)
Continue with current prescription therapy as reflected on the Med list.  

## 2012-07-07 NOTE — Assessment & Plan Note (Addendum)
Wt Readings from Last 3 Encounters:  07/07/12 121 lb (54.885 kg)  05/26/12 122 lb (55.339 kg)  03/24/12 128 lb 12.8 oz (58.423 kg)  Reduce Norvasc 5/14 she had doppler US: no DVT or pelvic compression

## 2012-07-27 ENCOUNTER — Telehealth: Payer: Self-pay | Admitting: Internal Medicine

## 2012-07-27 NOTE — Telephone Encounter (Signed)
No OV with me for several years. OK to change - no problem.

## 2012-07-27 NOTE — Telephone Encounter (Signed)
Ok Thx 

## 2012-07-27 NOTE — Telephone Encounter (Signed)
Regina Hamilton is requesting to switch PCP from Dr. Debby Bud to Dr. Posey Rea.  Please call her daughter, Claris Che to let her know if approved.

## 2012-07-28 ENCOUNTER — Ambulatory Visit: Payer: Medicare Other | Admitting: Internal Medicine

## 2012-07-28 ENCOUNTER — Telehealth: Payer: Self-pay

## 2012-07-28 DIAGNOSIS — M79674 Pain in right toe(s): Secondary | ICD-10-CM

## 2012-07-28 DIAGNOSIS — I739 Peripheral vascular disease, unspecified: Secondary | ICD-10-CM

## 2012-07-28 NOTE — Telephone Encounter (Signed)
Returned phone call to daughter.  States pt. went to ER at the [facility] last night due to increased pain in right 4th toe @ site of new ulcer.  Reports ulcer (R) 4th toe to be approx. "size of small pea with slight concave depth to it, and small amt serousanguinous dnge. with a yellow tint".  Was advised to make appt. w/ PCP w/in next week and to schedule f/u appt. with vascular surgeon.  Was told that this was not emergent, but to schedule the vascular follow-up.  Per ER MD, pt. Was started on Keflex qid and Hydrocodone for pain.  Daughter was advised to soak pt's foot in Epsom salts BID.  Daughter states pt. will be seeing her PCP 6/17.  Will schedule appt. with Dr. Edilia Bo.

## 2012-07-28 NOTE — Telephone Encounter (Signed)
Message copied by Phillips Odor on Wed Jul 28, 2012  1:58 PM ------      Message from: FITZPATRICK, DOROTHY K      Created: Wed Jul 28, 2012 11:50 AM      Regarding: New Ulcer      Contact: [phone]       Carmine Savoy, pt's dtr called. Pt was in ER yesterday. They want her to be seen for an ulcer on her right foot 4th toe. Please triage and let us know - thank you! ------

## 2012-07-29 ENCOUNTER — Ambulatory Visit: Payer: Medicare Other | Admitting: Internal Medicine

## 2012-07-30 NOTE — Telephone Encounter (Signed)
Daughter is aware and made an appt with Dr. Posey Rea for July 2.

## 2012-08-09 ENCOUNTER — Other Ambulatory Visit: Payer: Self-pay | Admitting: *Deleted

## 2012-08-18 ENCOUNTER — Ambulatory Visit: Payer: Medicare Other | Admitting: Internal Medicine

## 2012-08-19 ENCOUNTER — Other Ambulatory Visit: Payer: Medicare Other

## 2012-08-19 ENCOUNTER — Encounter (INDEPENDENT_AMBULATORY_CARE_PROVIDER_SITE_OTHER): Payer: Medicare Other | Admitting: *Deleted

## 2012-08-19 DIAGNOSIS — M79674 Pain in right toe(s): Secondary | ICD-10-CM

## 2012-08-19 DIAGNOSIS — Z48812 Encounter for surgical aftercare following surgery on the circulatory system: Secondary | ICD-10-CM

## 2012-08-19 DIAGNOSIS — I739 Peripheral vascular disease, unspecified: Secondary | ICD-10-CM

## 2012-08-31 ENCOUNTER — Encounter: Payer: Self-pay | Admitting: Vascular Surgery

## 2012-09-01 ENCOUNTER — Ambulatory Visit: Payer: Medicare Other | Admitting: Vascular Surgery

## 2012-09-02 ENCOUNTER — Encounter (HOSPITAL_COMMUNITY): Payer: Self-pay | Admitting: Emergency Medicine

## 2012-09-02 ENCOUNTER — Emergency Department (HOSPITAL_COMMUNITY): Payer: Medicare Other

## 2012-09-02 ENCOUNTER — Inpatient Hospital Stay (HOSPITAL_COMMUNITY)
Admission: EM | Admit: 2012-09-02 | Discharge: 2012-09-04 | DRG: 305 | Disposition: A | Discharge status: Home / Self Care | Payer: Medicare Other | Attending: Internal Medicine | Admitting: Internal Medicine

## 2012-09-02 DIAGNOSIS — R61 Generalized hyperhidrosis: Secondary | ICD-10-CM | POA: Diagnosis present

## 2012-09-02 DIAGNOSIS — I517 Cardiomegaly: Secondary | ICD-10-CM | POA: Diagnosis present

## 2012-09-02 DIAGNOSIS — R0609 Other forms of dyspnea: Secondary | ICD-10-CM | POA: Diagnosis present

## 2012-09-02 DIAGNOSIS — R42 Dizziness and giddiness: Secondary | ICD-10-CM | POA: Diagnosis present

## 2012-09-02 DIAGNOSIS — Z79899 Other long term (current) drug therapy: Secondary | ICD-10-CM

## 2012-09-02 DIAGNOSIS — M1610 Unilateral primary osteoarthritis, unspecified hip: Secondary | ICD-10-CM | POA: Diagnosis present

## 2012-09-02 DIAGNOSIS — I10 Essential (primary) hypertension: Principal | ICD-10-CM | POA: Diagnosis present

## 2012-09-02 DIAGNOSIS — I4891 Unspecified atrial fibrillation: Secondary | ICD-10-CM

## 2012-09-02 DIAGNOSIS — Z7902 Long term (current) use of antithrombotics/antiplatelets: Secondary | ICD-10-CM

## 2012-09-02 DIAGNOSIS — M169 Osteoarthritis of hip, unspecified: Secondary | ICD-10-CM | POA: Diagnosis present

## 2012-09-02 DIAGNOSIS — E876 Hypokalemia: Secondary | ICD-10-CM | POA: Diagnosis present

## 2012-09-02 DIAGNOSIS — I70219 Atherosclerosis of native arteries of extremities with intermittent claudication, unspecified extremity: Secondary | ICD-10-CM

## 2012-09-02 DIAGNOSIS — R0989 Other specified symptoms and signs involving the circulatory and respiratory systems: Secondary | ICD-10-CM | POA: Diagnosis present

## 2012-09-02 DIAGNOSIS — F411 Generalized anxiety disorder: Secondary | ICD-10-CM | POA: Diagnosis present

## 2012-09-02 DIAGNOSIS — Z981 Arthrodesis status: Secondary | ICD-10-CM

## 2012-09-02 DIAGNOSIS — R609 Edema, unspecified: Secondary | ICD-10-CM

## 2012-09-02 DIAGNOSIS — R5383 Other fatigue: Secondary | ICD-10-CM

## 2012-09-02 DIAGNOSIS — M1990 Unspecified osteoarthritis, unspecified site: Secondary | ICD-10-CM

## 2012-09-02 DIAGNOSIS — E785 Hyperlipidemia, unspecified: Secondary | ICD-10-CM | POA: Diagnosis present

## 2012-09-02 DIAGNOSIS — H8309 Labyrinthitis, unspecified ear: Secondary | ICD-10-CM

## 2012-09-02 DIAGNOSIS — Z950 Presence of cardiac pacemaker: Secondary | ICD-10-CM

## 2012-09-02 DIAGNOSIS — K1370 Unspecified lesions of oral mucosa: Secondary | ICD-10-CM | POA: Diagnosis present

## 2012-09-02 DIAGNOSIS — K219 Gastro-esophageal reflux disease without esophagitis: Secondary | ICD-10-CM | POA: Diagnosis present

## 2012-09-02 DIAGNOSIS — Z7982 Long term (current) use of aspirin: Secondary | ICD-10-CM

## 2012-09-02 DIAGNOSIS — R5381 Other malaise: Secondary | ICD-10-CM | POA: Diagnosis present

## 2012-09-02 DIAGNOSIS — Z66 Do not resuscitate: Secondary | ICD-10-CM | POA: Diagnosis present

## 2012-09-02 DIAGNOSIS — D62 Acute posthemorrhagic anemia: Secondary | ICD-10-CM

## 2012-09-02 DIAGNOSIS — I872 Venous insufficiency (chronic) (peripheral): Secondary | ICD-10-CM | POA: Diagnosis present

## 2012-09-02 DIAGNOSIS — R64 Cachexia: Secondary | ICD-10-CM | POA: Diagnosis present

## 2012-09-02 DIAGNOSIS — R0600 Dyspnea, unspecified: Secondary | ICD-10-CM

## 2012-09-02 DIAGNOSIS — S62109A Fracture of unspecified carpal bone, unspecified wrist, initial encounter for closed fracture: Secondary | ICD-10-CM

## 2012-09-02 DIAGNOSIS — I2510 Atherosclerotic heart disease of native coronary artery without angina pectoris: Secondary | ICD-10-CM | POA: Diagnosis present

## 2012-09-02 DIAGNOSIS — I739 Peripheral vascular disease, unspecified: Secondary | ICD-10-CM | POA: Diagnosis present

## 2012-09-02 LAB — CBC
HCT: 39.3 % (ref 36.0–46.0)
Hemoglobin: 13.1 g/dL (ref 12.0–15.0)
MCH: 28.2 pg (ref 26.0–34.0)
MCHC: 33.3 g/dL (ref 30.0–36.0)
Platelets: 322 10*3/uL (ref 150–400)
RBC: 4.46 MIL/uL (ref 3.87–5.11)
RBC: 4.64 MIL/uL (ref 3.87–5.11)
WBC: 8.7 10*3/uL (ref 4.0–10.5)

## 2012-09-02 LAB — BASIC METABOLIC PANEL
CO2: 23 mEq/L (ref 19–32)
Calcium: 9.5 mg/dL (ref 8.4–10.5)
Chloride: 103 mEq/L (ref 96–112)
Sodium: 137 mEq/L (ref 135–145)

## 2012-09-02 LAB — URINALYSIS, ROUTINE W REFLEX MICROSCOPIC
Ketones, ur: NEGATIVE mg/dL
Nitrite: NEGATIVE
Specific Gravity, Urine: 1.016 (ref 1.005–1.030)
pH: 8 (ref 5.0–8.0)

## 2012-09-02 LAB — CREATININE, SERUM: Creatinine, Ser: 0.76 mg/dL (ref 0.50–1.10)

## 2012-09-02 LAB — PRO B NATRIURETIC PEPTIDE: Pro B Natriuretic peptide (BNP): 2164 pg/mL — ABNORMAL HIGH (ref 0–450)

## 2012-09-02 LAB — TROPONIN I
Troponin I: <0.3 ng/mL (ref <0.30)
Troponin I: <0.3 ng/mL (ref <0.30)

## 2012-09-02 LAB — HEPATIC FUNCTION PANEL
AST: 29 U/L (ref 0–37)
Bilirubin, Direct: 0.2 mg/dL (ref 0.0–0.3)

## 2012-09-02 LAB — POCT I-STAT TROPONIN I: Troponin i, poc: 0.02 ng/mL (ref 0.00–0.08)

## 2012-09-02 LAB — URINE MICROSCOPIC-ADD ON

## 2012-09-02 MED ORDER — ENOXAPARIN SODIUM 40 MG/0.4ML ~~LOC~~ SOLN
40.0000 mg | SUBCUTANEOUS | Status: DC
  Administered 2012-09-02 – 2012-09-03 (×2): 40 mg via SUBCUTANEOUS
  Filled 2012-09-02 (×4): qty 0.4

## 2012-09-02 MED ORDER — GABAPENTIN 300 MG PO CAPS
300.0000 mg | ORAL_CAPSULE | Freq: Every day | ORAL | Status: DC
  Administered 2012-09-02 – 2012-09-03 (×2): 300 mg via ORAL
  Filled 2012-09-02 (×4): qty 1

## 2012-09-02 MED ORDER — ALPRAZOLAM 0.25 MG PO TABS
0.2500 mg | ORAL_TABLET | Freq: Every evening | ORAL | Status: DC | PRN
  Administered 2012-09-02 – 2012-09-03 (×2): 0.25 mg via ORAL
  Filled 2012-09-02 (×2): qty 1

## 2012-09-02 MED ORDER — SODIUM CHLORIDE 0.9 % IV SOLN
Freq: Once | INTRAVENOUS | Status: AC
  Administered 2012-09-02: 15:00:00 via INTRAVENOUS

## 2012-09-02 MED ORDER — ASPIRIN EC 81 MG PO TBEC
81.0000 mg | DELAYED_RELEASE_TABLET | Freq: Every day | ORAL | Status: DC
  Administered 2012-09-03 – 2012-09-04 (×2): 81 mg via ORAL
  Filled 2012-09-02 (×2): qty 1

## 2012-09-02 MED ORDER — ALISKIREN FUMARATE 150 MG PO TABS
150.0000 mg | ORAL_TABLET | Freq: Every day | ORAL | Status: DC
  Administered 2012-09-03: 150 mg via ORAL
  Filled 2012-09-02: qty 1

## 2012-09-02 MED ORDER — AMLODIPINE BESYLATE 5 MG PO TABS
5.0000 mg | ORAL_TABLET | Freq: Every day | ORAL | Status: DC
  Administered 2012-09-02: 5 mg via ORAL
  Filled 2012-09-02 (×2): qty 1

## 2012-09-02 MED ORDER — VITAMIN D3 25 MCG (1000 UNIT) PO TABS
2000.0000 [IU] | ORAL_TABLET | Freq: Every day | ORAL | Status: DC
  Administered 2012-09-02 – 2012-09-04 (×3): 2000 [IU] via ORAL
  Filled 2012-09-02 (×3): qty 2

## 2012-09-02 MED ORDER — TRAMADOL HCL 50 MG PO TABS
50.0000 mg | ORAL_TABLET | Freq: Four times a day (QID) | ORAL | Status: DC | PRN
  Administered 2012-09-02 – 2012-09-03 (×2): 50 mg via ORAL
  Filled 2012-09-02 (×2): qty 1

## 2012-09-02 MED ORDER — POTASSIUM CHLORIDE CRYS ER 20 MEQ PO TBCR
20.0000 meq | EXTENDED_RELEASE_TABLET | Freq: Two times a day (BID) | ORAL | Status: AC
  Administered 2012-09-02 – 2012-09-04 (×4): 20 meq via ORAL
  Filled 2012-09-02 (×5): qty 1

## 2012-09-02 MED ORDER — LOTEPREDNOL ETABONATE 0.5 % OP SUSP
1.0000 [drp] | Freq: Every day | OPHTHALMIC | Status: DC
  Administered 2012-09-03 – 2012-09-04 (×2): 1 [drp] via OPHTHALMIC
  Filled 2012-09-02: qty 5

## 2012-09-02 MED ORDER — HYDRALAZINE HCL 50 MG PO TABS
50.0000 mg | ORAL_TABLET | Freq: Two times a day (BID) | ORAL | Status: DC
  Administered 2012-09-02 – 2012-09-04 (×4): 50 mg via ORAL
  Filled 2012-09-02 (×6): qty 1

## 2012-09-02 MED ORDER — CLOPIDOGREL BISULFATE 75 MG PO TABS
75.0000 mg | ORAL_TABLET | Freq: Every day | ORAL | Status: DC
  Administered 2012-09-03 – 2012-09-04 (×2): 75 mg via ORAL
  Filled 2012-09-02 (×3): qty 1

## 2012-09-02 MED ORDER — FUROSEMIDE 10 MG/ML IJ SOLN
40.0000 mg | Freq: Once | INTRAMUSCULAR | Status: AC
  Administered 2012-09-02: 40 mg via INTRAVENOUS
  Filled 2012-09-02: qty 4

## 2012-09-02 MED ORDER — NITROGLYCERIN 2 % TD OINT
0.5000 [in_us] | TOPICAL_OINTMENT | Freq: Once | TRANSDERMAL | Status: AC
  Administered 2012-09-02: 0.5 [in_us] via TOPICAL
  Filled 2012-09-02: qty 30

## 2012-09-02 MED ORDER — CALCIUM CARBONATE 600 MG PO TABS
600.0000 mg | ORAL_TABLET | Freq: Every day | ORAL | Status: DC
  Filled 2012-09-02: qty 1

## 2012-09-02 MED ORDER — VASCULERA PO TABS: 1.0000 | ORAL_TABLET | Freq: Two times a day (BID) | ORAL | Status: DC

## 2012-09-02 MED ORDER — DORZOLAMIDE HCL 2 % OP SOLN
1.0000 [drp] | Freq: Two times a day (BID) | OPHTHALMIC | Status: DC
  Administered 2012-09-02 – 2012-09-04 (×4): 1 [drp] via OPHTHALMIC
  Filled 2012-09-02: qty 10

## 2012-09-02 MED ORDER — CALCIUM CARBONATE 1250 (500 CA) MG PO TABS
1.0000 | ORAL_TABLET | Freq: Every day | ORAL | Status: DC
Start: 2012-09-03 — End: 2012-09-04
  Administered 2012-09-03 – 2012-09-04 (×2): 500 mg via ORAL
  Filled 2012-09-02 (×3): qty 1

## 2012-09-02 NOTE — Progress Notes (Signed)
PHARMACIST - PHYSICIAN ORDER COMMUNICATION  CONCERNING: P&T Medication Policy on Herbal Medications  DESCRIPTION:  This patient's order for:  VASCULERA  has been noted.  This product(s) is classified as an "herbal" or natural product. Due to a lack of definitive safety studies or FDA approval, nonstandard manufacturing practices, plus the potential risk of unknown drug-drug interactions while on inpatient medications, the Pharmacy and Therapeutics Committee does not permit the use of "herbal" or natural products of this type within [facility].   ACTION TAKEN: The pharmacy department is unable to verify this order at this time and your patient has been informed of this safety policy. Please reevaluate patient's clinical condition at discharge and address if the herbal or natural product(s) should be resumed at that time.

## 2012-09-02 NOTE — ED Notes (Addendum)
Family reports sob since Monday that comes and goes.  Also complains of weakness for past 2 weeks.  Complains of back pain worse than usual.  No N/v. Some diarrhea, has been dealing with high blood pressure.

## 2012-09-02 NOTE — ED Provider Notes (Signed)
History    CSN: [id number] Arrival date & time 09/02/12  1222  First MD Initiated Contact with Patient 09/02/12 1257     Chief Complaint  Patient presents with  . Shortness of Breath  . Fatigue   (Consider location/radiation/quality/duration/timing/severity/associated sxs/prior Treatment) HPI  Patient presents with shortness of breath and generalized weakness.  This episode began several days ago, without clear precipitant.  Since onset symptoms have been progressive, with worsening dyspnea on exertion, generalized weakness. No clear exacerbating or alleviating factors. The patient denies waking, but does state that she feels progressively more uncomfortable. No new medications, and activity, and a new diet. Patient's notable history of vasculopathy, CAD.   Past Medical History  Diagnosis Date  . Peripheral vascular disease   . Carotid artery occlusion   . Hypertension   . Hyperlipidemia   . Arthritis   . Pacemaker 2008    [facility] in [place], [place]  . Complication of anesthesia   . Dysrhythmia     atrial fibrilation  . Anxiety   . GERD (gastroesophageal reflux disease)   . Chronic venous insufficiency     B   Past Surgical History  Procedure Laterality Date  . Spine surgery  1970    Cervical fusion  . Eye surgery      Bilateral Cornea Transplants  . Joint replacement      Right & Left Total Hip   . Cardio ablation- pacemaker    . Pr vein bypass graft,aorto-fem-pop    . Total hip revision  04-08-2010  . Cholecystectomy  05/20/11  . Angioplasty  03/01/2012  . Insert / replace / remove pacemaker     Family History  Problem Relation Age of Onset  . Heart disease Mother     Heart disease before age 76  . Heart disease Father     Heat Diease before age 14  . Heart disease Maternal Grandmother   . Heart disease Paternal Grandmother    History  Substance Use Topics  . Smoking status: Never Smoker   . Smokeless tobacco: Never Used  . Alcohol  Use: No   OB History   Grav Para Term Preterm Abortions TAB SAB Ect Mult Living                 Review of Systems  Constitutional:       Per HPI, otherwise negative  HENT:       Per HPI, otherwise negative  Respiratory:       Per HPI, otherwise negative  Cardiovascular:       Per HPI, otherwise negative  Gastrointestinal: Negative for vomiting.  Endocrine:       Negative aside from HPI  Genitourinary:       Neg aside from HPI   Musculoskeletal:       Per HPI, otherwise negative  Skin: Negative.   Neurological: Negative for syncope.    Allergies  Midazolam; Warfarin sodium; Alphagan p; Oxycodone hcl; Clonidine derivatives; Contrast media; Diltiazem hcl; Iron; Meloxicam; and Moxifloxacin  Home Medications   Current Outpatient Rx  Name  Route  Sig  Dispense  Refill  . aliskiren (TEKTURNA) 150 MG tablet   Oral   Take 150 mg by mouth daily.          Marland Kitchen ALPRAZolam (XANAX) 0.25 MG tablet   Oral   Take 0.25 mg by mouth at bedtime as needed for sleep.          Marland Kitchen amLODipine (NORVASC) 10  MG tablet   Oral   Take 0.5 tablets (5 mg total) by mouth daily.   90 tablet   3   . aspirin EC 81 MG tablet   Oral   Take 81 mg by mouth daily.         . B Complex-C-Folic Acid (STRESS FORMULA) TABS   Oral   Take 1 tablet by mouth daily.         . calcium carbonate (OS-CAL) 600 MG TABS   Oral   Take 600 mg by mouth daily.         . cholecalciferol (VITAMIN D) 1000 UNITS tablet   Oral   Take 2,000 Units by mouth daily.          . clopidogrel (PLAVIX) 75 MG tablet   Oral   Take 75 mg by mouth daily.         . Dietary Management Product (VASCULERA) TABS   Oral   Take 1 tablet by mouth 2 (two) times daily.   60 tablet   11   . dorzolamide (TRUSOPT) 2 % ophthalmic solution   Left Eye   Place 1 drop into the left eye 2 (two) times daily.          Marland Kitchen gabapentin (NEURONTIN) 300 MG capsule   Oral   Take 300 mg by mouth at bedtime.         . hydrALAZINE  (APRESOLINE) 25 MG tablet   Oral   Take 25 mg by mouth 2 (two) times daily.         Marland Kitchen loteprednol (LOTEMAX) 0.5 % ophthalmic suspension   Both Eyes   Place 1 drop into both eyes daily.         . Potassium Gluconate 2 MEQ TABS   Oral   Take 2 each by mouth daily.         . traMADol (ULTRAM) 50 MG tablet   Oral   Take 50 mg by mouth every 6 (six) hours as needed for pain.         . valsartan (DIOVAN) 160 MG tablet   Oral   Take 160 mg by mouth daily.          BP 174/66  Pulse 70  Temp(Src) 97.7 F (36.5 C) (Oral)  Resp 25  SpO2 94% Physical Exam  Nursing note and vitals reviewed. Constitutional: She is oriented to person, place, and time. She appears cachectic. She is active. She appears ill.  HENT:  Head: Normocephalic and atraumatic.  Eyes: Conjunctivae and EOM are normal.  Cardiovascular: Normal rate and regular rhythm.   Murmur heard. Pulmonary/Chest: Effort normal. No stridor. No respiratory distress. She has decreased breath sounds.  Abdominal: She exhibits no distension.  Musculoskeletal: She exhibits no edema.  Neurological: She is alert and oriented to person, place, and time. No cranial nerve deficit.  Skin: Skin is warm and dry.     Psychiatric: She has a normal mood and affect.    ED Course  Procedures (including critical care time) Labs Reviewed  BASIC METABOLIC PANEL - Abnormal; Notable for the following:    Potassium 3.3 (*)    Glucose, Bld 138 (*)    GFR calc non Af Amer 75 (*)    GFR calc Af Amer 87 (*)    All other components within normal limits  PRO B NATRIURETIC PEPTIDE - Abnormal; Notable for the following:    Pro B Natriuretic peptide (BNP) 2164.0 (*)    All  other components within normal limits  URINALYSIS, ROUTINE W REFLEX MICROSCOPIC - Abnormal; Notable for the following:    Leukocytes, UA SMALL (*)    All other components within normal limits  HEPATIC FUNCTION PANEL - Abnormal; Notable for the following:    Albumin 3.1  (*)    All other components within normal limits  CBC  URINE MICROSCOPIC-ADD ON  POCT I-STAT TROPONIN I   Dg Chest 2 View  09/02/2012   *RADIOLOGY REPORT*  Clinical Data: Shortness of breath, weakness  CHEST - 2 VIEW  Comparison: 10/01/2010  Findings: Borderline cardiomegaly.  Dual lead cardiac pacemaker is unchanged in position.  There is small left pleural effusion with left basilar atelectasis or infiltrate.  No pulmonary edema.  IMPRESSION: No pulmonary edema.  Borderline cardiomegaly.  Small left pleural effusion with left basilar atelectasis or infiltrate. Prior vertebroplasty noted lumbar spine.   Original Report Authenticated By: Natasha Mead, M.D.   No diagnosis found. Pulse ox 91% room air abnormal .  70 paced abnormal EKG has paced ventricular rhythm at 70 abnormal   Initial labs demonstrate elevated BNP.  Patient subsequently received Lasix, nitro paste. MDM  This elderly female with hypertension, vasculopathy now presents with dyspnea and fatigue.  On exam she is awake and alert, in no distress.  Patient's evaluation is most consistent with heart failure, the patient has no diagnosis of this.  With this thought and the need for additional studies, the patient was admitted for further evaluation and management.  Gerhard Munch, MD 09/02/12 1530

## 2012-09-02 NOTE — ED Notes (Signed)
MD at bedside. 

## 2012-09-02 NOTE — H&P (Signed)
PCP:   Sonda Primes, MD   Chief Complaint:  Shortness of breath  HPI: 79 year old female with a history of peripheral arterial disease, hypertension uncontrolled, status post pacemaker, hyperlipidemia who came to the hospital today with two-day history of dyspnea. Patient also has been complaining of mouth ulcers which started after she changed her toothpaste. She denies nausea vomiting or diarrhea, denies chest pain. Denies fever no dysuria urgency or frequency of urination. Regarding shortness of breath, patient says that she becomes short of breath on walking. In the ED patient was found to be hypertensive with systolic blood pressure around 170-180. Patient's primary care has been adjusting the blood pressure medications.  Allergies:   Allergies  Allergen Reactions  . Midazolam     REACTION: hallucinations per daughter  . Warfarin Sodium Rash    Unknown  . Alphagan P (Brimonidine Tartrate) Photosensitivity    Redness bilateral eye  . Oxycodone Hcl Itching  . Clonidine Derivatives Other (See Comments)    Pt's daughter states she has sever hallucinations  . Contrast Media (Iodinated Diagnostic Agents)     Contrast dye.  . Diltiazem Hcl     Unknown  . Iron     REACTION: RASH  . Meloxicam     Unknown  . Moxifloxacin     REACTION: Tongue swelling; mouth ulcers; confusion      Past Medical History  Diagnosis Date  . Peripheral vascular disease   . Carotid artery occlusion   . Hypertension   . Hyperlipidemia   . Arthritis   . Pacemaker 2008    [facility] in [place], [place]  . Complication of anesthesia   . Dysrhythmia     atrial fibrilation  . Anxiety   . GERD (gastroesophageal reflux disease)   . Chronic venous insufficiency     B    Past Surgical History  Procedure Laterality Date  . Spine surgery  1970    Cervical fusion  . Eye surgery      Bilateral Cornea Transplants  . Joint replacement      Right & Left Total Hip   . Cardio ablation-  pacemaker    . Pr vein bypass graft,aorto-fem-pop    . Total hip revision  04-08-2010  . Cholecystectomy  05/20/11  . Angioplasty  03/01/2012  . Insert / replace / remove pacemaker      Prior to Admission medications   Medication Sig Start Date End Date Taking? Authorizing Provider  aliskiren (TEKTURNA) 150 MG tablet Take 150 mg by mouth daily.    Yes Historical Provider, MD  ALPRAZolam Prudy Feeler) 0.25 MG tablet Take 0.25 mg by mouth at bedtime as needed for sleep.    Yes Historical Provider, MD  amLODipine (NORVASC) 10 MG tablet Take 0.5 tablets (5 mg total) by mouth daily. 07/07/12  Yes Georgina Quint Plotnikov, MD  aspirin EC 81 MG tablet Take 81 mg by mouth daily.   Yes Historical Provider, MD  B Complex-C-Folic Acid (STRESS FORMULA) TABS Take 1 tablet by mouth daily.   Yes Historical Provider, MD  calcium carbonate (OS-CAL) 600 MG TABS Take 600 mg by mouth daily.   Yes Historical Provider, MD  cholecalciferol (VITAMIN D) 1000 UNITS tablet Take 2,000 Units by mouth daily.    Yes Historical Provider, MD  clopidogrel (PLAVIX) 75 MG tablet Take 75 mg by mouth daily.   Yes Historical Provider, MD  Dietary Management Product (VASCULERA) TABS Take 1 tablet by mouth 2 (two) times daily. 07/07/12  Yes Aleksei  V Plotnikov, MD  dorzolamide (TRUSOPT) 2 % ophthalmic solution Place 1 drop into the left eye 2 (two) times daily.    Yes Historical Provider, MD  gabapentin (NEURONTIN) 300 MG capsule Take 300 mg by mouth at bedtime.   Yes Historical Provider, MD  hydrALAZINE (APRESOLINE) 25 MG tablet Take 25 mg by mouth 2 (two) times daily.   Yes Historical Provider, MD  loteprednol (LOTEMAX) 0.5 % ophthalmic suspension Place 1 drop into both eyes daily.   Yes Historical Provider, MD  Potassium Gluconate 2 MEQ TABS Take 2 each by mouth daily.   Yes Historical Provider, MD  traMADol (ULTRAM) 50 MG tablet Take 50 mg by mouth every 6 (six) hours as needed for pain.   Yes Historical Provider, MD    Social History:   reports that she has never smoked. She has never used smokeless tobacco. She reports that she does not drink alcohol or use illicit drugs.  Family History  Problem Relation Age of Onset  . Heart disease Mother     Heart disease before age 83  . Heart disease Father     Heat Diease before age 52  . Heart disease Maternal Grandmother   . Heart disease Paternal Grandmother      All the positives are listed in BOLD  Review of Systems:  HEENT: Headache, blurred vision, runny nose, sore throat, mouth ulcers Neck: Hypothyroidism, hyperthyroidism,,lymphadenopathy Chest : Shortness of breath, history of COPD, Asthma Heart : Chest pain, history of coronary arterey disease GI:  Nausea, vomiting, diarrhea, constipation, GERD GU: Dysuria, urgency, frequency of urination, hematuria Neuro: Stroke, seizures, syncope Psych: Depression, anxiety, hallucinations   Physical Exam: Blood pressure 174/66, pulse 70, temperature 97.7 F (36.5 C), temperature source Oral, resp. rate 25, SpO2 94.00%. Constitutional:   Patient is a well-developed and well-nourished female* in no acute distress and cooperative with exam. Head: Normocephalic and atraumatic Mouth: Mucus membranes moist Eyes: PERRL, EOMI, conjunctivae normal Neck: Supple, No Thyromegaly Cardiovascular: RRR, S1 normal, S2 normal Pulmonary/Chest: CTAB, no wheezes, rales, or rhonchi Abdominal: Soft. Non-tender, non-distended, bowel sounds are normal, no masses, organomegaly, or guarding present.  Neurological: A&O x3, Strenght is normal and symmetric bilaterally, cranial nerve II-XII are grossly intact, no focal motor deficit, sensory intact to light touch bilaterally.  Extremities : No Cyanosis, Clubbing or Edema   Labs on Admission:  Results for orders placed during the hospital encounter of 09/02/12 (from the past 48 hour(s))  CBC     Status: None   Collection Time    09/02/12 12:40 PM      Result Value Range   WBC 8.7  4.0 - 10.5 K/uL    RBC 4.46  3.87 - 5.11 MIL/uL   Hemoglobin 12.5  12.0 - 15.0 g/dL   HCT 96.0  45.4 - 09.8 %   MCV 84.5  78.0 - 100.0 fL   MCH 28.0  26.0 - 34.0 pg   MCHC 33.2  30.0 - 36.0 g/dL   RDW 11.9  14.7 - 82.9 %   Platelets 322  150 - 400 K/uL  BASIC METABOLIC PANEL     Status: Abnormal   Collection Time    09/02/12 12:40 PM      Result Value Range   Sodium 137  135 - 145 mEq/L   Potassium 3.3 (*) 3.5 - 5.1 mEq/L   Chloride 103  96 - 112 mEq/L   CO2 23  19 - 32 mEq/L   Glucose, Bld 138 (*) 70 -  99 mg/dL   BUN 10  6 - 23 mg/dL   Creatinine, Ser 1.61  0.50 - 1.10 mg/dL   Calcium 9.5  8.4 - 09.6 mg/dL   GFR calc non Af Amer 75 (*) >90 mL/min   GFR calc Af Amer 87 (*) >90 mL/min   Comment:            The eGFR has been calculated     using the CKD EPI equation.     This calculation has not been     validated in all clinical     situations.     eGFR's persistently     <90 mL/min signify     possible Chronic Kidney Disease.  POCT I-STAT TROPONIN I     Status: None   Collection Time    09/02/12 12:55 PM      Result Value Range   Troponin i, poc 0.02  0.00 - 0.08 ng/mL   Comment 3            Comment: Due to the release kinetics of cTnI,     a negative result within the first hours     of the onset of symptoms does not rule out     myocardial infarction with certainty.     If myocardial infarction is still suspected,     repeat the test at appropriate intervals.  PRO B NATRIURETIC PEPTIDE     Status: Abnormal   Collection Time    09/02/12 12:59 PM      Result Value Range   Pro B Natriuretic peptide (BNP) 2164.0 (*) 0 - 450 pg/mL  HEPATIC FUNCTION PANEL     Status: Abnormal   Collection Time    09/02/12 12:59 PM      Result Value Range   Total Protein 6.7  6.0 - 8.3 g/dL   Albumin 3.1 (*) 3.5 - 5.2 g/dL   AST 29  0 - 37 U/L   ALT 22  0 - 35 U/L   Alkaline Phosphatase 84  39 - 117 U/L   Total Bilirubin 0.7  0.3 - 1.2 mg/dL   Bilirubin, Direct 0.2  0.0 - 0.3 mg/dL   Indirect  Bilirubin 0.5  0.3 - 0.9 mg/dL  URINALYSIS, ROUTINE W REFLEX MICROSCOPIC     Status: Abnormal   Collection Time    09/02/12  1:18 PM      Result Value Range   Color, Urine YELLOW  YELLOW   APPearance CLEAR  CLEAR   Specific Gravity, Urine 1.016  1.005 - 1.030   pH 8.0  5.0 - 8.0   Glucose, UA NEGATIVE  NEGATIVE mg/dL   Hgb urine dipstick NEGATIVE  NEGATIVE   Bilirubin Urine NEGATIVE  NEGATIVE   Ketones, ur NEGATIVE  NEGATIVE mg/dL   Protein, ur NEGATIVE  NEGATIVE mg/dL   Urobilinogen, UA 1.0  0.0 - 1.0 mg/dL   Nitrite NEGATIVE  NEGATIVE   Leukocytes, UA SMALL (*) NEGATIVE  URINE MICROSCOPIC-ADD ON     Status: None   Collection Time    09/02/12  1:18 PM      Result Value Range   Squamous Epithelial / LPF RARE  RARE   WBC, UA 0-2  <3 WBC/hpf   RBC / HPF 0-2  <3 RBC/hpf    Radiological Exams on Admission: Dg Chest 2 View  09/02/2012   *RADIOLOGY REPORT*  Clinical Data: Shortness of breath, weakness  CHEST - 2 VIEW  Comparison: 10/01/2010  Findings: Borderline cardiomegaly.  Dual lead cardiac pacemaker is unchanged in position.  There is small left pleural effusion with left basilar atelectasis or infiltrate.  No pulmonary edema.  IMPRESSION: No pulmonary edema.  Borderline cardiomegaly.  Small left pleural effusion with left basilar atelectasis or infiltrate. Prior vertebroplasty noted lumbar spine.   Original Report Authenticated By: Natasha Mead, M.D.    Assessment/Plan Active Problems:   HYPERTENSION   UNSPECIFIED PERIPHERAL VASCULAR DISEASE   Pacemaker-[employer]   Dyspnea  hypokalemia  Dyspnea Patient's BNP is elevated to 2164, she did get Lasix 40 mg IV x1 in the ED. Clinically she does not appear to be in acute shortness of breath. No crackles on auscultation of the lungs. Patient ioxygen saturation is 99% on room air. I will get 2-D echo to rule out underlying diastolic dysfunction. Patient may benefit from low-dose Lasix at home depending on the echo report. Chest  x-ray does not show interstitial edema. We'll also obtain troponin every 6 hours x3  Hypertension uncontrolled Patient's primary care provider has been trying to adjust medications. She is not taking Diovan, as she was put on Tekturna recently. I'm going to continue amlodipine 5 mg by mouth daily and also increase the dose of hydralazine to 50 mg by mouth twice a day  Hypokalemia Replace potassium  Status post pacemaker EKG shows paced rhythm Will monitor on telemetry  Peripheral vascular disease Stable  Code status: DO NOT RESUSCITATE  Family discussion: Discussed with daughter at bedside   Time Spent on Admission: 65 minutes  [facility] Pager: [phone] 09/02/2012, 3:36 PM  If 7PM-7AM, please contact night-coverage  [email or web address]  Password [email or web address]

## 2012-09-02 NOTE — ED Notes (Addendum)
Patient transported to CT 

## 2012-09-03 DIAGNOSIS — I369 Nonrheumatic tricuspid valve disorder, unspecified: Secondary | ICD-10-CM

## 2012-09-03 DIAGNOSIS — I4891 Unspecified atrial fibrillation: Secondary | ICD-10-CM

## 2012-09-03 LAB — TROPONIN I: Troponin I: <0.3 ng/mL (ref <0.30)

## 2012-09-03 MED ORDER — MAGIC MOUTHWASH W/LIDOCAINE
10.0000 mL | Freq: Four times a day (QID) | ORAL | Status: DC | PRN
  Filled 2012-09-03: qty 10

## 2012-09-03 MED ORDER — ALISKIREN FUMARATE 150 MG PO TABS
300.0000 mg | ORAL_TABLET | Freq: Every day | ORAL | Status: DC
  Administered 2012-09-04: 300 mg via ORAL
  Filled 2012-09-03: qty 2

## 2012-09-03 MED ORDER — AMLODIPINE BESYLATE 10 MG PO TABS
10.0000 mg | ORAL_TABLET | Freq: Every day | ORAL | Status: DC
  Administered 2012-09-04: 10 mg via ORAL
  Filled 2012-09-03: qty 1

## 2012-09-03 MED ORDER — FUROSEMIDE 20 MG PO TABS
20.0000 mg | ORAL_TABLET | Freq: Once | ORAL | Status: AC
  Administered 2012-09-03: 20 mg via ORAL
  Filled 2012-09-03: qty 1

## 2012-09-03 MED ORDER — ENSURE COMPLETE PO LIQD
237.0000 mL | Freq: Two times a day (BID) | ORAL | Status: DC
  Administered 2012-09-03 – 2012-09-04 (×3): 237 mL via ORAL

## 2012-09-03 MED ORDER — FUROSEMIDE 40 MG PO TABS
40.0000 mg | ORAL_TABLET | Freq: Every day | ORAL | Status: DC
  Administered 2012-09-04: 40 mg via ORAL
  Filled 2012-09-03: qty 1

## 2012-09-03 MED ORDER — AMLODIPINE BESYLATE 10 MG PO TABS
10.0000 mg | ORAL_TABLET | Freq: Once | ORAL | Status: AC
  Administered 2012-09-03: 10 mg via ORAL
  Filled 2012-09-03: qty 1

## 2012-09-03 MED ORDER — FUROSEMIDE 20 MG PO TABS
20.0000 mg | ORAL_TABLET | Freq: Every day | ORAL | Status: DC
  Administered 2012-09-03: 20 mg via ORAL
  Filled 2012-09-03: qty 1

## 2012-09-03 NOTE — Progress Notes (Signed)
[facility] PROGRESS NOTE  Regina Hamilton:[id number] DOB: [birth year] DOA: 09/02/2012 PCP: Sonda Primes, MD  Assessment/Plan: Active Problems:   HYPERTENSION   UNSPECIFIED PERIPHERAL VASCULAR DISEASE   Pacemaker-[employer]   Dyspnea    1. Dyspnea: Patient presented with 2 days of SOBOE, ProBNP was 2164, CXR revealed borderline cardiomegaly, but no pulmonary edema. BP was markedly elevated, with SBP of 170-180. Patient has responded to a single dose of iv Lasix administered in the ED, and is asymptomatic today. Cardiac enzymes are unelevated and 12-Lead EKG shows paced complexes. 2-D echocardiogram is pending. Suspect the culprit is uncontrolled HTN with hypertensive urgency and LV strain. Will consult Dr Aurelio Jew al. Of note, patient's pre-admission lasix was recently discontinued, due to concerns for renal dysfunction, following contrast study. Will place on oral Lasix today. Creatinine is 0.76.  2. Hypertension uncontrolled: As described above, SBP at presentation was 170-180. Reportedly, patient's BP has proven somewhat difficult to control in the outpatient setting. Initially placed on Nitropaste in the ED, as well as Amlodipine. Hydralazine was increased to 50 mg BID. Today, BP is reasonable at 152/60. Will discontinue Nitropaste, and adjust medication as indicated.  3. Hypokalemia:  Mild. Repleting as indicated.  4. A.Fib/Status post pacemaker: EKG shows paced rhythm. Stable and rate-controlled. Monitoring on telemetrically.  5. Peripheral vascular disease: Stable/Asymptomatic.    Code Status: DNR.  Family Communication:  Disposition Plan: To be determined.    Brief narrative: 79 year old female with a history of anxiety, dyslipidemia, OA/DJD, s/p bilateral hips replacements, s/p spinal fusion, GERD, chronic venous insufficiency, peripheral arterial disease s/p aorto-fem bypass, carotid artery occlusion, hypertension, A. Fib, s/p Ablation/pacemaker, who came to the  hospital on 09/02/12, with two-day history of dyspnea on exertion. Patient also has been complaining of mouth ulcers which started after she changed her toothpaste. She denies nausea vomiting or diarrhea, denies chest pain. Denies fever, no dysuria urgency or frequency of urination. In the ED, patient was found to be hypertensive with systolic blood pressure around 170-180. Patient's primary care has been adjusting the blood pressure medications. Admitted for further evaluation and management.    Consultants:  N/A.   Procedures:  CXR  Antibiotics:  N/A.   HPI/Subjective: Feels 'fine".   Objective: Vital signs in last 24 hours: Temp:  [97.7 F (36.5 C)-98.6 F (37 C)] 98 F (36.7 C) (07/18 0527) Pulse Rate:  [70] 70 (07/18 0527) Resp:  [16-28] 20 (07/18 0527) BP: (151-194)/(54-98) 152/60 mmHg (07/18 0527) SpO2:  [93 %-95 %] 93 % (07/18 0527) Weight:  [51.075 kg (112 lb 9.6 oz)] 51.075 kg (112 lb 9.6 oz) (07/17 1545) Weight change:  Last BM Date: 09/01/12  Intake/Output from previous day: 07/17 0701 - 07/18 0700 In: 120 [P.O.:120] Out: 2050 [Urine:2050]     Physical Exam: General: Comfortable, alert, communicative, fully oriented, not short of breath at rest.  HEENT:  No clinical pallor, no jaundice, no conjunctival injection or discharge. NECK:  Supple, JVP not seen, no carotid bruits, no palpable lymphadenopathy, no palpable goiter. CHEST:  Clinically clear to auscultation, no wheezes, no crackles. HEART:  Sounds 1 and 2 heard, normal, regular, no murmurs. ABDOMEN:  Full, soft, non-tender, no palpable organomegaly, no palpable masses, normal bowel sounds. GENITALIA:  Not examined. LOWER EXTREMITIES:  Minimal pitting edema, palpable peripheral pulses. MUSCULOSKELETAL SYSTEM:  Generalized osteoarthritic changes, otherwise, normal. CENTRAL NERVOUS SYSTEM:  No focal neurologic deficit on gross examination.  Lab Results:  Recent Labs  09/02/12 1240 09/02/12 1615  WBC 8.7 7.3  HGB 12.5 13.1  HCT 37.7 39.3  PLT 322 344    Recent Labs  09/02/12 1240 09/02/12 1615  NA 137  --   K 3.3*  --   CL 103  --   CO2 23  --   GLUCOSE 138*  --   BUN 10  --   CREATININE 0.73 0.76  CALCIUM 9.5  --    No results found for this or any previous visit (from the past 240 hour(s)).   Studies/Results: Dg Chest 2 View  09/02/2012   *RADIOLOGY REPORT*  Clinical Data: Shortness of breath, weakness  CHEST - 2 VIEW  Comparison: 10/01/2010  Findings: Borderline cardiomegaly.  Dual lead cardiac pacemaker is unchanged in position.  There is small left pleural effusion with left basilar atelectasis or infiltrate.  No pulmonary edema.  IMPRESSION: No pulmonary edema.  Borderline cardiomegaly.  Small left pleural effusion with left basilar atelectasis or infiltrate. Prior vertebroplasty noted lumbar spine.   Original Report Authenticated By: Natasha Mead, M.D.    Medications: Scheduled Meds: . aliskiren  150 mg Oral Daily  . amLODipine  5 mg Oral Daily  . aspirin EC  81 mg Oral Daily  . calcium carbonate  1 tablet Oral Q breakfast  . cholecalciferol  2,000 Units Oral Daily  . clopidogrel  75 mg Oral Q breakfast  . dorzolamide  1 drop Left Eye BID  . enoxaparin (LOVENOX) injection  40 mg Subcutaneous Q24H  . gabapentin  300 mg Oral QHS  . hydrALAZINE  50 mg Oral BID  . loteprednol  1 drop Both Eyes Daily  . potassium chloride  20 mEq Oral BID   Continuous Infusions:  PRN Meds:.ALPRAZolam, magic mouthwash w/lidocaine, traMADol    LOS: 1 day   Shirrell Solinger,CHRISTOPHER  [facility] Pager [phone]. If 8PM-8AM, please contact night-coverage at [email or web address], password [facility] 09/03/2012, 8:48 AM  LOS: 1 day

## 2012-09-03 NOTE — Care Management Note (Signed)
CARE MANAGEMENT NOTE 09/03/2012  Patient:  Regina Hamilton, Regina Hamilton   Account Number:  [id number]  Date Initiated:  09/03/2012  Documentation initiated by:  Roneshia Drew  Subjective/Objective Assessment:   79 yo female admitted with hypertension & dyspnea.     Action/Plan:   Home when stable   Anticipated DC Date:     Anticipated DC Plan:  HOME/SELF CARE      DC Planning Services  CM consult      Choice offered to / List presented to:  NA   DME arranged  NA      DME agency  NA     HH arranged  NA      HH agency  NA   Status of service:  In process, will continue to follow Medicare Important Message given?   (If response is "NO", the following Medicare IM given date fields will be blank) Date Medicare IM given:   Date Additional Medicare IM given:    Discharge Disposition:    Per UR Regulation:  Reviewed for med. necessity/level of care/duration of stay  If discussed at Long Length of Stay Meetings, dates discussed:    Comments:  09/03/12 1400 Jakobee Brackins,RN,BSN [phone] chart reviewed for utilization of services. No needs identified at this time.

## 2012-09-03 NOTE — Discharge Summary (Signed)
Physician Discharge Summary  Regina Hamilton OZH:[id number] DOB: 1923-06-26 DOA: 09/02/2012  PCP: Sonda Primes, MD  Admit date: 09/02/2012 Discharge date: 09/04/2012  Time spent: 40 minutes  Recommendations for Outpatient Follow-up:  1. Follow up with primary MD. 2. Follow up with Dr Sherryl Manges, cardiologist.   Discharge Diagnoses:  Active Problems:   HYPERTENSION   UNSPECIFIED PERIPHERAL VASCULAR DISEASE   Pacemaker-[employer]   Dyspnea   Discharge Condition: Satisfactory.  Diet recommendation: Heart-Healthy.  Filed Weights   09/02/12 1545 09/04/12 0521  Weight: 51.075 kg (112 lb 9.6 oz) 50.758 kg (111 lb 14.4 oz)    History of present illness:  79 year old female with a history of anxiety, dyslipidemia, OA/DJD, s/p bilateral hips replacements, s/p spinal fusion, GERD, chronic venous insufficiency, peripheral arterial disease s/p aorto-fem bypass, carotid artery occlusion, hypertension, A. Fib, s/p Ablation/pacemaker, who came to the hospital on 09/02/12, with two-day history of dyspnea on exertion. Patient also has been complaining of mouth ulcers which started after she changed her toothpaste. She denies nausea vomiting or diarrhea, denies chest pain. Denies fever, no dysuria urgency or frequency of urination. In the ED, patient was found to be hypertensive with systolic blood pressure around 170-180. Patient's primary care has been adjusting the blood pressure medications. Admitted for further evaluation and management.    Hospital Course:  1. Dyspnea: Patient presented with 2 days of SOBOE, ProBNP was 2164, CXR revealed borderline cardiomegaly, but no pulmonary edema. BP was markedly elevated, with SBP of 170-180. Patient responded to a single dose of iv Lasix administered in the ED, and as of 09/03/12, was asymptomatic. Cardiac enzymes remained unelevated and 12-Lead EKG showed paced complexes. 2-D echocardiogram revealed normal LV function with EF of 60%. The culprit  appears to be uncontrolled HTN with hypertensive urgency and LV strain. Of note, patient's pre-admission lasix was recently discontinued, due to concerns for renal dysfunction, following contrast study. Creatinine was 0.73 at presentation. Dr Leodis Sias provided cardiology consultation, and has recommended meticulous BP control, as well as continued lasix. Patient will follow up with cardiologist, on discharge.  2. Hypertension uncontrolled: As described above, SBP at presentation was 170-180. Reportedly, patient's BP has proven somewhat difficult to control in the outpatient setting. Initially placed on Nitropaste in the ED, as well as Amlodipine, while Hydralazine was increased to 50 mg BID. As of AM of 09/03/12, BP was more reasonable at 152/60. Nitropaste was therefore, discontinued and medication adjusted as indicated. Diovan was discontinued,and Tekturna increased to 300 mg daily. As of 09/04/12, BP had normalized at 120/54, and patient was asymptomatic.  3. Hypokalemia: Mild. Repleted as indicated.  4. A.Fib/Status post pacemaker: EKG shows paced rhythm. Stable and rate-controlled. Monitored telemetrically.  5. Peripheral vascular disease: Stable/Asymptomatic.    Procedures:  See Below.  2D echocardiogram.   Consultations:  Dr Kristeen Miss, cardiologist.   Discharge Exam: Filed Vitals:   09/03/12 1617 09/03/12 2121 09/04/12 0521 09/04/12 0915  BP: 179/58 164/59 143/49 120/54  Pulse: 70 70 70   Temp:  98.3 F (36.8 C) 98.2 F (36.8 C)   TempSrc:  Oral Oral   Resp: 20 18 18    Height:      Weight:   50.758 kg (111 lb 14.4 oz)   SpO2: 98% 98% 93%     General: Comfortable, alert, communicative, fully oriented, not short of breath at rest.  HEENT: No clinical pallor, no jaundice, no conjunctival injection or discharge.  NECK: Supple, JVP not seen, no carotid bruits, no  palpable lymphadenopathy, no palpable goiter.  CHEST: Clinically clear to auscultation, no wheezes, no  crackles.  HEART: Sounds 1 and 2 heard, normal, regular, no murmurs.  ABDOMEN: Full, soft, non-tender, no palpable organomegaly, no palpable masses, normal bowel sounds.  GENITALIA: Not examined.  LOWER EXTREMITIES: Minimal pitting edema, palpable peripheral pulses.  MUSCULOSKELETAL SYSTEM: Generalized osteoarthritic changes, otherwise, normal.  CENTRAL NERVOUS SYSTEM: No focal neurologic deficit on gross examination.  Discharge Instructions      Discharge Orders   Future Appointments Provider Department Dept Phone   09/29/2012 9:00 AM Chuck Hint, MD [facility] [phone]   09/29/2012 10:30 AM Tresa Garter, MD [facility] [phone]   11/15/2012 9:00 AM [facility] [employer] Main Office [place]) [phone]   12/15/2012 9:00 AM [facility] [phone]   12/15/2012 9:30 AM [facility] [phone]   12/15/2012 10:30 AM [facility] [phone]   12/15/2012 11:30 AM Chuck Hint, MD [facility] [phone]   Future Orders Complete By Expires     Diet - low sodium heart healthy  As directed     Increase activity slowly  As directed         Medication List    STOP taking these medications       valsartan 160 MG tablet  Commonly known as:  DIOVAN      TAKE these medications       aliskiren 300 MG tablet  Commonly known as:  TEKTURNA  Take 1 tablet (300 mg total) by mouth daily.     ALPRAZolam 0.25 MG tablet  Commonly known as:  XANAX  Take 0.25 mg by mouth at bedtime as needed for sleep.     amLODipine 10 MG tablet  Commonly known as:  NORVASC  Take 1 tablet (10 mg total) by mouth daily.     aspirin EC 81 MG tablet  Take 81 mg by mouth daily.     calcium carbonate 600 MG Tabs  Commonly known as:  OS-CAL   Take 600 mg by mouth daily.     cholecalciferol 1000 UNITS tablet  Commonly known as:  VITAMIN D  Take 2,000 Units by mouth daily.     clopidogrel 75 MG tablet  Commonly known as:  PLAVIX  Take 75 mg by mouth daily.     dorzolamide 2 % ophthalmic solution  Commonly known as:  TRUSOPT  Place 1 drop into the left eye 2 (two) times daily.     feeding supplement Liqd  Take 237 mLs by mouth 2 (two) times daily between meals.     furosemide 40 MG tablet  Commonly known as:  LASIX  Take 1 tablet (40 mg total) by mouth daily.     gabapentin 300 MG capsule  Commonly known as:  NEURONTIN  Take 300 mg by mouth at bedtime.     hydrALAZINE 50 MG tablet  Commonly known as:  APRESOLINE  Take 1 tablet (50 mg total) by mouth 2 (two) times daily.     LOTEMAX 0.5 % ophthalmic suspension  Generic drug:  loteprednol  Place 1 drop into both eyes daily.     Potassium Gluconate 2 MEQ Tabs  Take 2 each by mouth daily.     STRESS FORMULA Tabs  Take 1 tablet by mouth daily.     traMADol 50 MG tablet  Commonly known as:  ULTRAM  Take 50 mg by mouth every 6 (six) hours as needed for pain.     VASCULERA Tabs  Take 1 tablet by mouth 2 (two) times daily.       Allergies  Allergen Reactions  . Midazolam     REACTION: hallucinations per daughter  . Warfarin Sodium Rash    Unknown  . Alphagan P (Brimonidine Tartrate) Photosensitivity    Redness bilateral eye  . Oxycodone Hcl Itching  . Clonidine Derivatives Other (See Comments)    Pt's daughter states she has sever hallucinations  . Contrast Media (Iodinated Diagnostic Agents)     Contrast dye.  . Diltiazem Hcl     Unknown  . Iron     REACTION: RASH  . Meloxicam     Unknown  . Moxifloxacin     REACTION: Tongue swelling; mouth ulcers; confusion   Follow-up Information   Schedule an appointment as soon as possible for a visit with Sonda Primes, MD.   Contact information:   [address] [place] [address]       Schedule an appointment  as soon as possible for a visit with Sherryl Manges, MD.   Contact information:   [address] [place] [address] [phone]        The results of significant diagnostics from this hospitalization (including imaging, microbiology, ancillary and laboratory) are listed below for reference.    Significant Diagnostic Studies: Dg Chest 2 View  09/02/2012   *RADIOLOGY REPORT*  Clinical Data: Shortness of breath, weakness  CHEST - 2 VIEW  Comparison: 10/01/2010  Findings: Borderline cardiomegaly.  Dual lead cardiac pacemaker is unchanged in position.  There is small left pleural effusion with left basilar atelectasis or infiltrate.  No pulmonary edema.  IMPRESSION: No pulmonary edema.  Borderline cardiomegaly.  Small left pleural effusion with left basilar atelectasis or infiltrate. Prior vertebroplasty noted lumbar spine.   Original Report Authenticated By: Natasha Mead, M.D.    Microbiology: No results found for this or any previous visit (from the past 240 hour(s)).   Labs: Basic Metabolic Panel:  Recent Labs Lab 09/02/12 1240 09/02/12 1615  NA 137  --   K 3.3*  --   CL 103  --   CO2 23  --   GLUCOSE 138*  --   BUN 10  --   CREATININE 0.73 0.76  CALCIUM 9.5  --    Liver Function Tests:  Recent Labs Lab 09/02/12 1259  AST 29  ALT 22  ALKPHOS 84  BILITOT 0.7  PROT 6.7  ALBUMIN 3.1*   No results found for this basename: LIPASE, AMYLASE,  in the last 168 hours No results found for this basename: AMMONIA,  in the last 168 hours CBC:  Recent Labs Lab 09/02/12 1240 09/02/12 1615  WBC 8.7 7.3  HGB 12.5 13.1  HCT 37.7 39.3  MCV 84.5 84.7  PLT 322 344   Cardiac Enzymes:  Recent Labs Lab 09/02/12 1615 09/02/12 2235 09/03/12 0412  TROPONINI <0.30 <0.30 <0.30   BNP: BNP (last 3 results)  Recent Labs  09/02/12 1259 09/03/12 0412  PROBNP 2164.0* 1834.0*   CBG: No results found for this basename: GLUCAP,  in the last 168  hours     Signed:  Sommer Spickard,CHRISTOPHER  [facility] 09/04/2012, 10:49 AM

## 2012-09-03 NOTE — Progress Notes (Signed)
  Echocardiogram 2D Echocardiogram has been performed.  Cathie Beams 09/03/2012, 10:32 AM

## 2012-09-03 NOTE — Progress Notes (Signed)
INITIAL NUTRITION ASSESSMENT  DOCUMENTATION CODES Per approved criteria  -Not Applicable   INTERVENTION: 1. Ensure Complete po BID, each supplement provides 350 kcal and 13 grams of protein. 2. Encouraged high-calorie, high-protein foods at meals. Discussed ways to add calories and protein to usual meals.  NUTRITION DIAGNOSIS: Inadequate oral intake related to chronic illness as evidenced by reported intake less than estimated needs and weight loss.   Goal: Pt to meet >/= 90% of their estimated nutrition needs  Monitor:  Weight, po intake, labs  Reason for Assessment: MST  79 y.o. female  Admitting Dx: <principal problem not specified>  ASSESSMENT: Pt admitted with history of PAD, HTN, s/p pacemaker, and hyperlipidemia. Pt presented to hospital with shortness of breath.   Pt was in room with family who reported that she has not been eating well. They said that pt's usual body weight was about 120 lbs and that pt had lost weight. Pt's daughter reported that she had always been concerned with being overweight and had made a comment in the hospital about how much fat she had on her arms. Pt's reported intake is about 50% of her meals and that she doesn't completely skip meals, she just eats less. She reports a decrease in appetite. Pt's family members say that they are always telling her she needs to eat more and trying to get her to accept supplements including energy donuts, boost, ensure, and special k protein drinks.  Height: Ht Readings from Last 1 Encounters:  09/02/12 5\' 3"  (1.6 m)    Weight: Wt Readings from Last 1 Encounters:  09/02/12 112 lb 9.6 oz (51.075 kg)    Ideal Body Weight: 52.4 kg  % Ideal Body Weight: 98%  Wt Readings from Last 10 Encounters:  09/02/12 112 lb 9.6 oz (51.075 kg)  07/07/12 121 lb (54.885 kg)  05/26/12 122 lb (55.339 kg)  03/24/12 128 lb 12.8 oz (58.423 kg)  03/02/12 132 lb 11.5 oz (60.2 kg)  03/02/12 132 lb 11.5 oz (60.2 kg)  02/25/12  125 lb (56.7 kg)  01/21/12 126 lb 1.9 oz (57.208 kg)  11/26/11 127 lb 14.4 oz (58.015 kg)  11/10/11 130 lb (58.968 kg)    Usual Body Weight: 120 lbs  % Usual Body Weight: 93%  BMI:  Body mass index is 19.95 kg/(m^2).  Estimated Nutritional Needs: Kcal: 1300-1500 Protein: 65-75 g Fluid: >1.5 L  Skin: WNL  Diet Order: Sodium Restricted  EDUCATION NEEDS: -Education needs addressed   Intake/Output Summary (Last 24 hours) at 09/03/12 1013 Last data filed at 09/03/12 0900  Gross per 24 hour  Intake    360 ml  Output   2050 ml  Net  -1690 ml    Last BM: 7/17   Labs:   Recent Labs Lab 09/02/12 1240 09/02/12 1615  NA 137  --   K 3.3*  --   CL 103  --   CO2 23  --   BUN 10  --   CREATININE 0.73 0.76  CALCIUM 9.5  --   GLUCOSE 138*  --     CBG (last 3)  No results found for this basename: GLUCAP,  in the last 72 hours  Scheduled Meds: . aliskiren  150 mg Oral Daily  . amLODipine  5 mg Oral Daily  . aspirin EC  81 mg Oral Daily  . calcium carbonate  1 tablet Oral Q breakfast  . cholecalciferol  2,000 Units Oral Daily  . clopidogrel  75 mg Oral Q breakfast  .  dorzolamide  1 drop Left Eye BID  . enoxaparin (LOVENOX) injection  40 mg Subcutaneous Q24H  . gabapentin  300 mg Oral QHS  . hydrALAZINE  50 mg Oral BID  . loteprednol  1 drop Both Eyes Daily  . potassium chloride  20 mEq Oral BID    Continuous Infusions:   Past Medical History  Diagnosis Date  . Peripheral vascular disease   . Carotid artery occlusion   . Hypertension   . Hyperlipidemia   . Arthritis   . Pacemaker 2008    [facility] in [place], [place]  . Complication of anesthesia   . Dysrhythmia     atrial fibrilation  . Anxiety   . GERD (gastroesophageal reflux disease)   . Chronic venous insufficiency     B    Past Surgical History  Procedure Laterality Date  . Spine surgery  1970    Cervical fusion  . Eye surgery      Bilateral Cornea Transplants  . Joint  replacement      Right & Left Total Hip   . Cardio ablation- pacemaker    . Pr vein bypass graft,aorto-fem-pop    . Total hip revision  04-08-2010  . Cholecystectomy  05/20/11  . Angioplasty  03/01/2012  . Insert / replace / remove pacemaker    . Corneal transplant Bilateral     Ebbie Latus RD, LDN

## 2012-09-03 NOTE — Consult Note (Signed)
CARDIOLOGY CONSULT NOTE    Patient ID: Regina Hamilton MRN: [id number] DOB/AGE: 1923/11/27 79 y.o.  Admit date: 09/02/2012  Primary Physician   Sonda Primes, MD Primary Cardiologist   Graciela Husbands Reason for Consultation DOE  HPI: Regina Hamilton is a very pleasant 79 y.o. y/o female w/ PMHx of  A. Fib, s/p Ablation/pacemaker, chronic venous insufficiency, peripheral arterial disease s/p aorto-fem bypass, carotid artery occlusion, hypertension, anxiety, dyslipidemia, OA/DJD, s/p bilateral hips replacements, s/p spinal fusion, GERD, who came to the hospital on 09/02/12, with two-day history of dyspnea on exertion. She said this started on Monday and progressively got worse until her daughter took her to the ED at [facility]. She claims the SOB was mostly with walking, and somewhat relieved by rest. She describes some associated diaphoresis, dizziness, lightheadedness, and nausea. She denies any chest pain, LOC, fever, chills, cough, or recent illness. According to her daughter who is a Engineer, civil (consulting), her blood pressure had been high at home as well. In the ED, she was found to be hypertensive w/ systolic BP in the 180's.   Past Medical History  Diagnosis Date  . Peripheral vascular disease   . Carotid artery occlusion   . Hypertension   . Hyperlipidemia   . Arthritis   . Pacemaker 2008    [facility] in [place], [place]  . Complication of anesthesia   . Dysrhythmia     atrial fibrilation  . Anxiety   . GERD (gastroesophageal reflux disease)   . Chronic venous insufficiency     B     Past Surgical History  Procedure Laterality Date  . Spine surgery  1970    Cervical fusion  . Eye surgery      Bilateral Cornea Transplants  . Joint replacement      Right & Left Total Hip   . Cardio ablation- pacemaker    . Pr vein bypass graft,aorto-fem-pop    . Total hip revision  04-08-2010  . Cholecystectomy  05/20/11  . Angioplasty  03/01/2012  . Insert / replace / remove pacemaker    . Corneal  transplant Bilateral     Allergies  Allergen Reactions  . Midazolam     REACTION: hallucinations per daughter  . Warfarin Sodium Rash    Unknown  . Alphagan P (Brimonidine Tartrate) Photosensitivity    Redness bilateral eye  . Oxycodone Hcl Itching  . Clonidine Derivatives Other (See Comments)    Pt's daughter states she has sever hallucinations  . Contrast Media (Iodinated Diagnostic Agents)     Contrast dye.  . Diltiazem Hcl     Unknown  . Iron     REACTION: RASH  . Meloxicam     Unknown  . Moxifloxacin     REACTION: Tongue swelling; mouth ulcers; confusion    I have reviewed the patient's current medications  . aliskiren  150 mg Oral Daily  . [START ON 09/04/2012] amLODipine  10 mg Oral Daily  . aspirin EC  81 mg Oral Daily  . calcium carbonate  1 tablet Oral Q breakfast  . cholecalciferol  2,000 Units Oral Daily  . clopidogrel  75 mg Oral Q breakfast  . dorzolamide  1 drop Left Eye BID  . enoxaparin (LOVENOX) injection  40 mg Subcutaneous Q24H  . feeding supplement  237 mL Oral BID BM  . furosemide  20 mg Oral Daily  . gabapentin  300 mg Oral QHS  . hydrALAZINE  50 mg Oral BID  . loteprednol  1 drop Both Eyes Daily  . potassium chloride  20 mEq Oral BID     ALPRAZolam, magic mouthwash w/lidocaine, traMADol  Prior to Admission medications   Medication Sig Start Date End Date Taking? Authorizing Provider  aliskiren (TEKTURNA) 150 MG tablet Take 150 mg by mouth daily.    Yes Historical Provider, MD  ALPRAZolam Prudy Feeler) 0.25 MG tablet Take 0.25 mg by mouth at bedtime as needed for sleep.    Yes Historical Provider, MD  amLODipine (NORVASC) 10 MG tablet Take 0.5 tablets (5 mg total) by mouth daily. 07/07/12  Yes Georgina Quint Plotnikov, MD  aspirin EC 81 MG tablet Take 81 mg by mouth daily.   Yes Historical Provider, MD  B Complex-C-Folic Acid (STRESS FORMULA) TABS Take 1 tablet by mouth daily.   Yes Historical Provider, MD  calcium carbonate (OS-CAL) 600 MG TABS Take 600  mg by mouth daily.   Yes Historical Provider, MD  cholecalciferol (VITAMIN D) 1000 UNITS tablet Take 2,000 Units by mouth daily.    Yes Historical Provider, MD  clopidogrel (PLAVIX) 75 MG tablet Take 75 mg by mouth daily.   Yes Historical Provider, MD  Dietary Management Product (VASCULERA) TABS Take 1 tablet by mouth 2 (two) times daily. 07/07/12  Yes Georgina Quint Plotnikov, MD  dorzolamide (TRUSOPT) 2 % ophthalmic solution Place 1 drop into the left eye 2 (two) times daily.    Yes Historical Provider, MD  gabapentin (NEURONTIN) 300 MG capsule Take 300 mg by mouth at bedtime.   Yes Historical Provider, MD  hydrALAZINE (APRESOLINE) 25 MG tablet Take 25 mg by mouth 2 (two) times daily.   Yes Historical Provider, MD  loteprednol (LOTEMAX) 0.5 % ophthalmic suspension Place 1 drop into both eyes daily.   Yes Historical Provider, MD  Potassium Gluconate 2 MEQ TABS Take 2 each by mouth daily.   Yes Historical Provider, MD  traMADol (ULTRAM) 50 MG tablet Take 50 mg by mouth every 6 (six) hours as needed for pain.   Yes Historical Provider, MD     History   Social History  . Marital Status: Widowed    Spouse Name: N/A    Number of Children: N/A  . Years of Education: N/A   Occupational History  . Not on file.   Social History Main Topics  . Smoking status: Never Smoker   . Smokeless tobacco: Never Used  . Alcohol Use: No  . Drug Use: No  . Sexually Active: Not on file   Other Topics Concern  . Not on file   Social History Narrative  . No narrative on file    Family Status  Relation Status Death Age  . Mother Deceased   . Father Deceased    Family History  Problem Relation Age of Onset  . Heart disease Mother     Heart disease before age 27  . Heart disease Father     Heat Diease before age 40  . Heart disease Maternal Grandmother   . Heart disease Paternal Grandmother      ROS:   General: Had some diaphoresis associated w/ her recent SOB. Denies fever, chills, appetite change  and fatigue.  HEENT: Denies change in vision, congestion, sore throat, rhinorrhea, trouble swallowing, neck pain or neck stiffness.   Respiratory: SOB since Monday, mostly with exertion, relieved by rest. Denies any chest tightness, wheezing, or cough.   Cardiovascular: Denies chest pain, palpitations and leg swelling.  Gastrointestinal: Admits to some associated nausea. Denies vomiting, abdominal  pain, diarrhea, constipation, blood in stool and abdominal distention.  Endocrine: Denies chronic sweats, polyuria, polydipsia. Musculoskeletal: H/o arthtritis w/ some pain in hands and joints. Also claims to have some ongoing intermittent claudication. Denies myalgias, back pain, or gait problem.  Skin: Denies pallor, rash and wounds.  Neurological: Has had some dizziness and lightheadedness associated with SOB. Denies seizures, syncope, weakness, numbness and headaches.  Hematological: Bruises easily, on Plavix at home. Denies adenopathy, personal or family bleeding history.  Psychiatric/Behavioral: Denies mood changes, confusion, nervousness, sleep disturbance and agitation.  Physical Exam: Blood pressure 179/58, pulse 70, temperature 97.8 F (36.6 C), temperature source Oral, resp. rate 20, height 5\' 3"  (1.6 m), weight 112 lb 9.6 oz (51.075 kg), SpO2 98.00%.  General: Vital signs reviewed.  Patient is a well-developed and well-nourished, in no acute distress and cooperative with exam. Alert and oriented x3.  Head: Normocephalic and atraumatic. Mouth: No erythema, exudates, sores, or ulcerations. Moist mucus membranes. Eyes: PERRL, EOMI, conjunctivae normal, No scleral icterus.  Neck: Supple, trachea midline, normal ROM, No JVD, masses, or thyromegaly. Carotid bruit present on left side. Cardiovascular: RRR, S1 normal, S2 normal, II/IV systolic murmur heard, no gallops, or rubs. Pulmonary/Chest: Normal respiratory effort, CTAB, no wheezes, rales, or rhonchi. Abdominal: Soft. Non-tender,  non-distended, bowel sounds are normal, no masses, organomegaly, or guarding present.  Musculoskeletal: Arthiritic changes in the hands. No erythema, or stiffness, ROM full and no nontender. Extremities: No swelling or edema,  pulses symmetric and intact bilaterally, weak in the LE's. No cyanosis or clubbing. Hematology: no cervical, inginal, or axillary adenopathy.  Neurological: A&O x3, Strength is normal and symmetric bilaterally, cranial nerve II-XII are grossly intact, no focal motor deficit, sensory intact to light touch bilaterally.  Skin: Warm, dry and intact. No rashes or erythema. Bruising present on hands and legs. Psychiatric: Normal mood and affect. speech and behavior is normal. Cognition and memory are normal.  Labs:   Lab Results  Component Value Date   WBC 7.3 09/02/2012   HGB 13.1 09/02/2012   HCT 39.3 09/02/2012   MCV 84.7 09/02/2012   PLT 344 09/02/2012   No results found for this basename: INR,  in the last 72 hours   Recent Labs Lab 09/02/12 1240 09/02/12 1259 09/02/12 1615  NA 137  --   --   K 3.3*  --   --   CL 103  --   --   CO2 23  --   --   BUN 10  --   --   CREATININE 0.73  --  0.76  CALCIUM 9.5  --   --   PROT  --  6.7  --   BILITOT  --  0.7  --   ALKPHOS  --  84  --   ALT  --  22  --   AST  --  29  --   GLUCOSE 138*  --   --    Magnesium  Date Value Range Status  06/23/2009 1.8  1.5 - 2.5 mg/dL Final    Recent Labs  16/10/96 1615 09/02/12 2235 09/03/12 0412  TROPONINI <0.30 <0.30 <0.30    Recent Labs  09/02/12 1255  TROPIPOC 0.02   Pro B Natriuretic peptide (BNP)  Date/Time Value Range Status  09/03/2012  4:12 AM 1834.0* 0 - 450 pg/mL Final  09/02/2012 12:59 PM 2164.0* 0 - 450 pg/mL Final   Lab Results  Component Value Date   CHOL 244* 07/09/2007   HDL 67.7 07/09/2007  TRIG 69 07/09/2007   No results found for this basename: DDIMER   No results found for this basename: Lipase,  amylase     Vitamin B-12  Date/Time Value  Range Status  06/20/2009  7:30 AM 1421* 211 - 911 pg/mL Final     Folate  Date/Time Value Range Status  06/20/2009  7:30 AM >20.0 (NOTE)  Reference Ranges        Deficient:       0.4 - 3.3 ng/mL        Indeterminate:   3.4 - 5.4 ng/mL        Normal:              > 5.4 ng/mL   Final     Ferritin  Date/Time Value Range Status  06/20/2009  7:30 AM 43  10 - 291 ng/mL Final     TIBC  Date/Time Value Range Status  06/20/2009  7:30 AM 279  250 - 470 ug/dL Final     Iron  Date/Time Value Range Status  06/20/2009  7:30 AM 71  42 - 135 ug/dL Final     Retic Ct Pct  Date/Time Value Range Status  06/20/2009  7:30 AM 0.5  0.4 - 3.1 % Final    Echo: 09/03/12 Study Conclusions: - Left ventricle: The cavity size was normal. Wall thickness was normal. The estimated ejection fraction was 60%. Wall motion was normal; there were no regional wall motion abnormalities. - Aortic valve: Sclerosis without stenosis. Mild regurgitation. - Mitral valve: Mildly calcified annulus. Mild regurgitation. - Right ventricle: Pacer wire or catheter noted in right ventricle. Systolic function was mildly reduced. - Tricuspid valve: Mild-moderate regurgitation. - Pulmonary arteries: PA peak pressure: 34mm Hg (S).  ECG: Ventricular pacing @ 70 bpm.  Radiology:  Dg Chest 2 View  09/02/2012   *RADIOLOGY REPORT*  Clinical Data: Shortness of breath, weakness  CHEST - 2 VIEW  Comparison: 10/01/2010  Findings: Borderline cardiomegaly.  Dual lead cardiac pacemaker is unchanged in position.  There is small left pleural effusion with left basilar atelectasis or infiltrate.  No pulmonary edema.  IMPRESSION: No pulmonary edema.  Borderline cardiomegaly.  Small left pleural effusion with left basilar atelectasis or infiltrate. Prior vertebroplasty noted lumbar spine.   Original Report Authenticated By: Natasha Mead, M.D.    ASSESSMENT AND PLAN:    79 y.o. y/o female w/ PMHx of  A. Fib, s/p Ablation/pacemaker, chronic venous  insufficiency, peripheral arterial disease s/p aorto-fem bypass, carotid artery occlusion, hypertension, anxiety, dyslipidemia, OA/DJD, s/p bilateral hips replacements, s/p spinal fusion, GERD, who came to the hospital on 09/02/12, with two-day history of dyspnea on exertion. Patient does not have any physical findings of breathing issues or symptoms of heart failure at this time. BNP was 2164 on admission, but has decreased to 1834 today. ECHO revealed normal LV function w/ EF of 60%. At this point, blood pressure control is the most important thing for Regina Hamilton. Diuresing well on Lasix in the hospital, but blood pressure still remains high. Plan to start Lasix 40 po qd at home.  Signed: Lars Masson, MD 09/03/2012 4:35 PM  Attending Note:   The patient was seen and examined.  Agree with assessment and plan as noted above.  Changes made to the above note as needed.  Regina Hamilton is feeling better.  She has been diuresed some.  Her echo shows normal LV systolic function.    I think she will do well with the addition of a  diuretic to her home meds.  We will try Lasix 40 a day.  We could also try HCTZ as OP if lasix seems to be too much.    She should be able to go home soon.  Vesta Mixer, Montez Hageman., MD, [facility] 09/03/2012, 4:43 PM

## 2012-09-04 LAB — BASIC METABOLIC PANEL
BUN: 19 mg/dL (ref 6–23)
GFR calc Af Amer: 62 mL/min — ABNORMAL LOW (ref >90)
GFR calc non Af Amer: 54 mL/min — ABNORMAL LOW (ref >90)
Potassium: 3.2 mEq/L — ABNORMAL LOW (ref 3.5–5.1)
Sodium: 138 mEq/L (ref 135–145)

## 2012-09-04 LAB — PRO B NATRIURETIC PEPTIDE: Pro B Natriuretic peptide (BNP): 1066 pg/mL — ABNORMAL HIGH (ref 0–450)

## 2012-09-04 MED ORDER — ALISKIREN FUMARATE 300 MG PO TABS: 300.0000 mg | ORAL_TABLET | Freq: Every day | ORAL | Status: DC

## 2012-09-04 MED ORDER — AMLODIPINE BESYLATE 10 MG PO TABS: 10.0000 mg | ORAL_TABLET | Freq: Every day | ORAL | Status: DC

## 2012-09-04 MED ORDER — FUROSEMIDE 40 MG PO TABS: 40.0000 mg | ORAL_TABLET | Freq: Every day | ORAL | Status: DC

## 2012-09-04 MED ORDER — ENSURE COMPLETE PO LIQD: 237.0000 mL | Freq: Two times a day (BID) | ORAL | Status: DC

## 2012-09-04 MED ORDER — HYDRALAZINE HCL 50 MG PO TABS: 50.0000 mg | ORAL_TABLET | Freq: Two times a day (BID) | ORAL | Status: DC

## 2012-09-04 NOTE — Progress Notes (Signed)
Subjective:  Breathing is much better. Her daughters are anxious for her to return home. Blood pressure appears to be coming under better control at this time with diuresis.  Objective:  Vital Signs in the last 24 hours: BP 143/49  Pulse 70  Temp(Src) 98.2 F (36.8 C) (Oral)  Resp 18  Ht 5\' 3"  (1.6 m)  Wt 50.758 kg (111 lb 14.4 oz)  BMI 19.83 kg/m2  SpO2 93%  Physical Exam: Pleasant small framed white female currently in no acute distress Lungs:  Clear  Cardiac:  Irregular  rhythm, normal S1 and S2, no S3 Abdomen:  Soft, nontender, no masses Extremities:  No edema present  Intake/Output from previous day: 07/18 0701 - 07/19 0700 In: 480 [P.O.:480] Out: 875 [Urine:875] Weight Filed Weights   09/02/12 1545 09/04/12 0521  Weight: 51.075 kg (112 lb 9.6 oz) 50.758 kg (111 lb 14.4 oz)    Lab Results: Basic Metabolic Panel:  Recent Labs  95/28/41 1240 09/02/12 1615  NA 137  --   K 3.3*  --   CL 103  --   CO2 23  --   GLUCOSE 138*  --   BUN 10  --   CREATININE 0.73 0.76    CBC:  Recent Labs  09/02/12 1240 09/02/12 1615  WBC 8.7 7.3  HGB 12.5 13.1  HCT 37.7 39.3  MCV 84.5 84.7  PLT 322 344    BNP    Component Value Date/Time   PROBNP 1834.0* 09/03/2012 0412    Assessment/Plan:  1. Dyspnea is likely due to some diastolic dysfunction in the setting of hypertension.  Recommendations:  She is anxious to go home and we would agree with this. Should go home and a diuretic and she should have early cardiology followup in the next one to 2 weeks with Dr. Graciela Husbands.     Darden Palmer  MD [facility]  09/04/2012, 8:45 AM

## 2012-09-06 ENCOUNTER — Telehealth: Payer: Self-pay | Admitting: Internal Medicine

## 2012-09-06 NOTE — Telephone Encounter (Signed)
New prob  Daughter wants to speak with your regarding an appt with Dr Johney Frame.  She said that her mom was just released from hospital.  I scheduled her for the first available.

## 2012-09-06 NOTE — Telephone Encounter (Signed)
Dr Graciela Husbands patient, appointment given for 09/21/12

## 2012-09-06 NOTE — Telephone Encounter (Signed)
Spoke with pt dtr, she is concerned because the follow up appt is not until 09-22-12. She reports the pt is sleeping a lot today. She is not SOB. They have an appt with dr Posey Rea tomorrow. They will call back if symptoms do not improve or change prior to appt. dtr agreed with this plan.

## 2012-09-07 ENCOUNTER — Ambulatory Visit (INDEPENDENT_AMBULATORY_CARE_PROVIDER_SITE_OTHER): Payer: Medicare Other | Admitting: Internal Medicine

## 2012-09-07 ENCOUNTER — Encounter: Payer: Self-pay | Admitting: Internal Medicine

## 2012-09-07 VITALS — BP 140/54 | HR 64 | Temp 98.0°F | Resp 16 | Wt 112.0 lb

## 2012-09-07 DIAGNOSIS — M25511 Pain in right shoulder: Secondary | ICD-10-CM

## 2012-09-07 DIAGNOSIS — M1990 Unspecified osteoarthritis, unspecified site: Secondary | ICD-10-CM

## 2012-09-07 DIAGNOSIS — M25519 Pain in unspecified shoulder: Secondary | ICD-10-CM

## 2012-09-07 DIAGNOSIS — R0600 Dyspnea, unspecified: Secondary | ICD-10-CM

## 2012-09-07 NOTE — Assessment & Plan Note (Signed)
Resolved  Hosp records were reviewed 

## 2012-09-07 NOTE — Assessment & Plan Note (Signed)
Continue with current prescription therapy as reflected on the Med list. R shoulder pain - ?bursitis

## 2012-09-07 NOTE — Progress Notes (Signed)
Subjective:     HPI  Post-hosp f/u:  History of present illness:  79 year old female with a history of anxiety, dyslipidemia, OA/DJD, s/p bilateral hips replacements, s/p spinal fusion, GERD, chronic venous insufficiency, peripheral arterial disease s/p aorto-fem bypass, carotid artery occlusion, hypertension, A. Fib, s/p Ablation/pacemaker, who came to the hospital on 09/02/12, with two-day history of dyspnea on exertion. Patient also has been complaining of mouth ulcers which started after she changed her toothpaste. She denies nausea vomiting or diarrhea, denies chest pain. Denies fever, no dysuria urgency or frequency of urination. In the ED, patient was found to be hypertensive with systolic blood pressure around 170-180. Patient's primary care has been adjusting the blood pressure medications. Admitted for further evaluation and management.  Hospital Course:  1. Dyspnea: Patient presented with 2 days of SOBOE, ProBNP was 2164, CXR revealed borderline cardiomegaly, but no pulmonary edema. BP was markedly elevated, with SBP of 170-180. Patient responded to a single dose of iv Lasix administered in the ED, and as of 09/03/12, was asymptomatic. Cardiac enzymes remained unelevated and 12-Lead EKG showed paced complexes. 2-D echocardiogram revealed normal LV function with EF of 60%. The culprit appears to be uncontrolled HTN with hypertensive urgency and LV strain. Of note, patient's pre-admission lasix was recently discontinued, due to concerns for renal dysfunction, following contrast study. Creatinine was 0.73 at presentation. Dr Leodis Sias provided cardiology consultation, and has recommended meticulous BP control, as well as continued lasix. Patient will follow up with cardiologist, on discharge.  2. Hypertension uncontrolled: As described above, SBP at presentation was 170-180. Reportedly, patient's BP has proven somewhat difficult to control in the outpatient setting. Initially placed on  Nitropaste in the ED, as well as Amlodipine, while Hydralazine was increased to 50 mg BID. As of AM of 09/03/12, BP was more reasonable at 152/60. Nitropaste was therefore, discontinued and medication adjusted as indicated. Diovan was discontinued,and Tekturna increased to 300 mg daily. As of 09/04/12, BP had normalized at 120/54, and patient was asymptomatic.  3. Hypokalemia: Mild. Repleted as indicated.  4. A.Fib/Status post pacemaker: EKG shows paced rhythm. Stable and rate-controlled. Monitored telemetrically.  5. Peripheral vascular disease: Stable/Asymptomatic.  Procedures:  See Below.  2D echocardiogram.  Consultations:  Dr Kristeen Miss, cardiologist.     C/o B legs new swelling and tenderness on 4/21. Dr Lynelle Doctor gave her Keflex x 2-3 wks, then Doxy (still on it) - it did not help. C/o PVD, tingling, pain. C/o LBP  BP Readings from Last 3 Encounters:  09/07/12 140/54  09/04/12 120/54  07/07/12 140/52   Wt Readings from Last 3 Encounters:  09/07/12 112 lb (50.803 kg)  09/04/12 111 lb 14.4 oz (50.758 kg)  07/07/12 121 lb (54.885 kg)     Review of Systems  Constitutional: Positive for fatigue. Negative for chills and unexpected weight change.  HENT: Positive for hearing loss. Negative for nosebleeds, congestion and sore throat.   Respiratory: Negative for cough, choking and wheezing.   Cardiovascular: Positive for leg swelling. Negative for chest pain and palpitations.  Gastrointestinal: Negative for nausea and abdominal pain.  Endocrine: Negative for polydipsia.  Genitourinary: Negative for flank pain, vaginal bleeding, vaginal discharge and pelvic pain.  Musculoskeletal: Positive for myalgias, arthralgias and gait problem. Negative for back pain and joint swelling.  Skin:       Redness B ankles, not warm or tender Toes are purplish B  Neurological: Negative for seizures and weakness.  Psychiatric/Behavioral: Negative for suicidal ideas and decreased concentration. The  patient is not nervous/anxious.        Objective:   Physical Exam  Constitutional: She appears well-developed and well-nourished. No distress.  HENT:  Head: Normocephalic.  Nose: Nose normal.  Mouth/Throat: Oropharynx is clear and moist. No oropharyngeal exudate.  Eyes: Left eye exhibits no discharge. No scleral icterus.  Neck: No JVD present. No tracheal deviation present. No thyromegaly present.  Cardiovascular: Exam reveals no gallop and no friction rub.   Murmur heard. irreg  Pulmonary/Chest: She has no wheezes. She has no rales. She exhibits no tenderness.  Abdominal: She exhibits no distension and no mass. There is no guarding.  Musculoskeletal: She exhibits edema. She exhibits no tenderness.  1+ B  Lymphadenopathy:    She has no cervical adenopathy.  Skin: No rash noted. There is erythema.  Redness B ankles, not warm or tender Toes are purplish B  Psychiatric: She has a normal mood and affect. Judgment normal.  R shoulder is swollen and tender  Labs reviewed. Hosp records reviewed   Procedure :Joint Injection, R  shoulder   Indication:  Subacromial bursitis with refractory  chronic pain.   Risks including unsuccessful procedure , bleeding, infection, bruising, skin atrophy and others were explained to the patient in detail as well as the benefits. Informed consent was obtained and signed.   Tthe patient was placed in a comfortable position. Lateral approach was used. Skin was prepped with Betadine and alcohol  and anesthetized with a cooling spray. Then, a 5 cc syringe with a 2 inch long 24-gauge needle was used for a joint injection.. The needle was advanced  Into the subacromial space.The bursa was injected with 3 mL of 2% lidocaine and 40 mg of Depo-Medrol .  Band-Aid was applied.   Tolerated well. Complications: None. Good pain relief following the procedure.          Assessment & Plan:

## 2012-09-07 NOTE — Patient Instructions (Addendum)
Please take Lasix if you had 2-4 lbs weight gain   Postprocedure instructions :    A Band-Aid should be left on for 12 hours. Injection therapy is not a cure itself. It is used in conjunction with other modalities. You can use nonsteroidal anti-inflammatories like ibuprofen , hot and cold compresses. Rest is recommended in the next 24 hours. You need to report immediately  if fever, chills or any signs of infection develop.

## 2012-09-08 ENCOUNTER — Encounter: Payer: Self-pay | Admitting: Internal Medicine

## 2012-09-08 MED ORDER — METHYLPREDNISOLONE ACETATE 40 MG/ML IJ SUSP: 40.0000 mg | Freq: Once | INTRAMUSCULAR | Status: DC

## 2012-09-13 ENCOUNTER — Encounter: Payer: Self-pay | Admitting: Internal Medicine

## 2012-09-13 ENCOUNTER — Other Ambulatory Visit (INDEPENDENT_AMBULATORY_CARE_PROVIDER_SITE_OTHER): Payer: Medicare Other

## 2012-09-13 ENCOUNTER — Ambulatory Visit (INDEPENDENT_AMBULATORY_CARE_PROVIDER_SITE_OTHER): Payer: Medicare Other | Admitting: Internal Medicine

## 2012-09-13 VITALS — BP 142/58 | HR 76 | Resp 16 | Wt 115.0 lb

## 2012-09-13 DIAGNOSIS — I482 Chronic atrial fibrillation, unspecified: Secondary | ICD-10-CM

## 2012-09-13 DIAGNOSIS — I6529 Occlusion and stenosis of unspecified carotid artery: Secondary | ICD-10-CM

## 2012-09-13 DIAGNOSIS — R0600 Dyspnea, unspecified: Secondary | ICD-10-CM

## 2012-09-13 DIAGNOSIS — R0989 Other specified symptoms and signs involving the circulatory and respiratory systems: Secondary | ICD-10-CM

## 2012-09-13 DIAGNOSIS — G629 Polyneuropathy, unspecified: Secondary | ICD-10-CM

## 2012-09-13 DIAGNOSIS — G609 Hereditary and idiopathic neuropathy, unspecified: Secondary | ICD-10-CM

## 2012-09-13 DIAGNOSIS — I4891 Unspecified atrial fibrillation: Secondary | ICD-10-CM

## 2012-09-13 DIAGNOSIS — R300 Dysuria: Secondary | ICD-10-CM

## 2012-09-13 DIAGNOSIS — M1990 Unspecified osteoarthritis, unspecified site: Secondary | ICD-10-CM

## 2012-09-13 DIAGNOSIS — L299 Pruritus, unspecified: Secondary | ICD-10-CM | POA: Insufficient documentation

## 2012-09-13 DIAGNOSIS — I10 Essential (primary) hypertension: Secondary | ICD-10-CM

## 2012-09-13 LAB — BASIC METABOLIC PANEL
Chloride: 97 mEq/L (ref 96–112)
Creatinine, Ser: 0.9 mg/dL (ref 0.4–1.2)
Potassium: 4 mEq/L (ref 3.5–5.1)
Sodium: 134 mEq/L — ABNORMAL LOW (ref 135–145)

## 2012-09-13 LAB — URINALYSIS, ROUTINE W REFLEX MICROSCOPIC
Hgb urine dipstick: NEGATIVE
Nitrite: NEGATIVE
Total Protein, Urine: NEGATIVE

## 2012-09-13 MED ORDER — LISINOPRIL 20 MG PO TABS: 20.0000 mg | ORAL_TABLET | Freq: Every day | ORAL | Status: DC

## 2012-09-13 MED ORDER — ALISKIREN FUMARATE 300 MG PO TABS: 150.0000 mg | ORAL_TABLET | Freq: Every day | ORAL | Status: DC

## 2012-09-13 NOTE — Progress Notes (Signed)
   Subjective:     HPI  C/o itching - resolved off Hydralazine. Face is peeling on 300 mg Tekturna  C/o B legs new swelling and tenderness on 4/21. Dr Lynelle Doctor gave her Keflex x 2-3 wks, then Doxy (still on it) - it did not help. C/o PVD, tingling, pain. C/o LBP  BP Readings from Last 3 Encounters:  09/13/12 142/58  09/07/12 140/54  09/04/12 120/54   Wt Readings from Last 3 Encounters:  09/13/12 115 lb (52.164 kg)  09/07/12 112 lb (50.803 kg)  09/04/12 111 lb 14.4 oz (50.758 kg)     Review of Systems  Constitutional: Positive for fatigue. Negative for chills and unexpected weight change.  HENT: Positive for hearing loss. Negative for nosebleeds, congestion and sore throat.   Respiratory: Negative for cough, choking and wheezing.   Cardiovascular: Positive for leg swelling. Negative for chest pain and palpitations.  Gastrointestinal: Negative for nausea and abdominal pain.  Endocrine: Negative for polydipsia.  Genitourinary: Negative for flank pain, vaginal bleeding, vaginal discharge and pelvic pain.  Musculoskeletal: Positive for myalgias, arthralgias and gait problem. Negative for back pain and joint swelling.  Skin:       Redness B ankles, not warm or tender Toes are purplish B  Neurological: Negative for seizures and weakness.  Psychiatric/Behavioral: Negative for suicidal ideas and decreased concentration. The patient is not nervous/anxious.        Objective:   Physical Exam  Constitutional: She appears well-developed and well-nourished. No distress.  HENT:  Head: Normocephalic.  Nose: Nose normal.  Mouth/Throat: Oropharynx is clear and moist. No oropharyngeal exudate.  Eyes: Left eye exhibits no discharge. No scleral icterus.  Neck: No JVD present. No tracheal deviation present. No thyromegaly present.  Cardiovascular: Exam reveals no gallop and no friction rub.   Murmur heard. irreg  Pulmonary/Chest: She has no wheezes. She has no rales. She exhibits no  tenderness.  Abdominal: She exhibits no distension and no mass. There is no guarding.  Musculoskeletal: She exhibits edema. She exhibits no tenderness.  1+ B  Lymphadenopathy:    She has no cervical adenopathy.  Skin: No rash noted. There is erythema.  Redness B ankles, not warm or tender Toes are purplish B  Psychiatric: She has a normal mood and affect. Judgment normal.       Assessment & Plan:

## 2012-09-13 NOTE — Assessment & Plan Note (Signed)
Resolved off Hydralazine

## 2012-09-14 ENCOUNTER — Encounter: Payer: Self-pay | Admitting: *Deleted

## 2012-09-22 ENCOUNTER — Other Ambulatory Visit: Payer: Self-pay

## 2012-09-22 ENCOUNTER — Ambulatory Visit (INDEPENDENT_AMBULATORY_CARE_PROVIDER_SITE_OTHER): Payer: Medicare Other | Admitting: Physician Assistant

## 2012-09-22 ENCOUNTER — Encounter: Payer: Self-pay | Admitting: Physician Assistant

## 2012-09-22 VITALS — BP 140/60 | HR 70 | Ht 63.0 in | Wt 118.8 lb

## 2012-09-22 DIAGNOSIS — I4891 Unspecified atrial fibrillation: Secondary | ICD-10-CM

## 2012-09-22 DIAGNOSIS — I872 Venous insufficiency (chronic) (peripheral): Secondary | ICD-10-CM

## 2012-09-22 DIAGNOSIS — I10 Essential (primary) hypertension: Secondary | ICD-10-CM

## 2012-09-22 DIAGNOSIS — I5032 Chronic diastolic (congestive) heart failure: Secondary | ICD-10-CM

## 2012-09-22 MED ORDER — FUROSEMIDE 40 MG PO TABS: 20.0000 mg | ORAL_TABLET | Freq: Every day | ORAL | Status: DC

## 2012-09-22 NOTE — Assessment & Plan Note (Signed)
Controlled.  

## 2012-09-22 NOTE — Progress Notes (Signed)
HPI:  This is an 79 year old female patient Dr. Graciela Husbands who was recently in the emergency room with dyspnea and heart failure felt secondary to diastolic dysfunction. She was diuresed with IV Lasix. Since she's been home she's had trouble with itching and her hydralazine was stopped. 2-D echo on 09/03/12 showed normal LV function ejection fraction 60% with no wall motion abnormalities, mild aortic regurgitation and mild mitral regurgitation.  She has a history of atrial fibrillation treated with AV ablation and pacemaker implantation 2010. Anticoagulation with Coumadin failed because of rash. She also has peripheral vascular disease and has undergone angioplasty of her lower sternum these by Dr. Durwin Nora. She also has a history of carotid artery occlusion, hypertension, dyslipidemia and GERD.  Since coming home from the hospital she is doing pretty well without any dyspnea, dyspnea on exertion, dizziness, chest pain, or presyncope. She has been taking Lasix 20 mg every other day because her weight increases by couple pounds. Her daughter is worried about weighing her every day because a sister died of anorexia and she doesn't focus on this. The patient also trying to gain weight because of recent weight loss.   Allergies: -- Midazolam    --  REACTION: hallucinations per daughter  -- Warfarin Sodium -- Rash   --  Unknown  -- Alphagan P (Brimonidine Tartrate) -- Photosensitivity   --  Redness bilateral eye  -- Oxycodone Hcl -- Itching  -- Clonidine Derivatives -- Other (See Comments)   --  Pt's daughter states she has sever hallucinations  -- Contrast Media (Iodinated Diagnostic Agents)    --  Contrast dye.  -- Diltiazem Hcl    --  Unknown  -- Hydralazine    --  itchig  -- Iron    --  REACTION: RASH  -- Meloxicam    --  Unknown  -- Moxifloxacin    --  REACTION: Tongue swelling; mouth ulcers; confusion  Current Outpatient Prescriptions on File Prior to Visit: aliskiren (TEKTURNA) 300 MG tablet,  Take 0.5 tablets (150 mg total) by mouth daily., Disp: 90 tablet, Rfl: 3 ALPRAZolam (XANAX) 0.25 MG tablet, Take 0.25 mg by mouth at bedtime as needed for sleep. , Disp: , Rfl:  amLODipine (NORVASC) 10 MG tablet, Take 1 tablet (10 mg total) by mouth daily., Disp: 30 tablet, Rfl: 2 aspirin EC 81 MG tablet, Take 81 mg by mouth daily., Disp: , Rfl:  B Complex-C-Folic Acid (STRESS FORMULA) TABS, Take 1 tablet by mouth daily., Disp: , Rfl:  cholecalciferol (VITAMIN D) 1000 UNITS tablet, Take 2,000 Units by mouth daily. , Disp: , Rfl:  clopidogrel (PLAVIX) 75 MG tablet, Take 75 mg by mouth daily., Disp: , Rfl:   Dietary Management Product (VASCULERA) TABS, Take 1 tablet by mouth 2 (two) times daily., Disp: 60 tablet, Rfl: 11 dorzolamide (TRUSOPT) 2 % ophthalmic solution, Place 1 drop into the left eye 2 (two) times daily. , Disp: , Rfl:  feeding supplement (ENSURE COMPLETE) LIQD, Take 237 mLs by mouth 2 (two) times daily between meals., Disp: 60 Bottle, Rfl: 0 furosemide (LASIX) 40 MG tablet, Take 20 mg by mouth daily., Disp: , Rfl:  gabapentin (NEURONTIN) 100 MG capsule, Take 100 mg by mouth at bedtime., Disp: , Rfl:  lisinopril (PRINIVIL,ZESTRIL) 20 MG tablet, Take 1 tablet (20 mg total) by mouth daily., Disp: 90 tablet, Rfl: 1 loteprednol (LOTEMAX) 0.5 % ophthalmic suspension, Place 1 drop into both eyes daily., Disp: , Rfl:  Potassium Gluconate 2 MEQ TABS, Take 2 each by mouth  daily., Disp: , Rfl:  traMADol (ULTRAM) 50 MG tablet, Take 50 mg by mouth every 6 (six) hours as needed for pain., Disp: , Rfl:   Current Facility-Administered Medications on File Prior to Visit: methylPREDNISolone acetate (DEPO-MEDROL) injection 40 mg, 40 mg, Intra-articular, Once, Tresa Garter, MD    Past Medical History:   Peripheral vascular disease                                  Carotid artery occlusion                                     Hypertension                                                  Hyperlipidemia                                               Arthritis                                                    Pacemaker                                       2008           Comment:[facility] in [place], [place]   Complication of anesthesia                                   Dysrhythmia                                                    Comment:atrial fibrilation   Anxiety                                                      GERD (gastroesophageal reflux disease)                       Chronic venous insufficiency                                   Comment:B  Past Surgical History:   SPINE SURGERY                                    1970  Comment:Cervical fusion   EYE SURGERY                                                     Comment:Bilateral Cornea Transplants   JOINT REPLACEMENT                                               Comment:Right & Left Total Hip    Cardio ablation- Pacemaker                                    PR VEIN BYPASS GRAFT,AORTO-FEM-POP                            TOTAL HIP REVISION                               04-08-2010   CHOLECYSTECTOMY                                  05/20/11       ANGIOPLASTY                                      03/01/2012    INSERT / REPLACE / REMOVE PACEMAKER                           CORNEAL TRANSPLANT                              Bilateral             Review of patient's family history indicates:   Heart disease                  Mother                     Comment: Heart disease before age 67   Heart disease                  Father                     Comment: Heat Diease before age 71   Heart disease                  Maternal Grandmother     Heart disease                  Paternal Grandmother     Social History   Marital Status: Widowed             Spouse Name:                      Years of Education:                 Number of  children:             Occupational History   None on file  Social History Main  Topics   Smoking Status: Never Smoker                     Smokeless Status: Never Used                       Alcohol Use: No             Drug Use: No             Sexual Activity: Not on file        Other Topics            Concern   None on file  Social History Narrative   None on file    ROS: Left ear hearing loss, recent memory problems, cervical neck problems, walks with cane, otherwise see history of present illness   PHYSICAL EXAM: Well-nournished, in no acute distress. Neck: Bilateral carotid bruits left greater than right, No JVD, HJR,or thyroid enlargement  Lungs: No tachypnea, clear without wheezing, rales, or rhonchi  Cardiovascular: RRR, PMI not displaced, 1-2/6 diastolic murmur at the left sternal border, no gallops, bruit, thrill, or heave.  Abdomen: BS normal. Soft without organomegaly, masses, lesions or tenderness.  Extremities: without cyanosis, clubbing or edema. Decreased distal pulses bilateral  SKin: Warm, no lesions or rashes   Musculoskeletal: No deformities  Neuro: no focal signs  BP 140/60  Pulse 70  Ht 5\' 3"  (1.6 m)  Wt 118 lb 12.8 oz (53.887 kg)  BMI 21.05 kg/m2   EKG: Ventricular paced   Study Conclusions  - Left ventricle: The cavity size was normal. Wall thickness   was normal. The estimated ejection fraction was 60%. Wall   motion was normal; there were no regional wall motion   abnormalities. - Aortic valve: Sclerosis without stenosis. Mild   regurgitation. - Mitral valve: Mildly calcified annulus. Mild   regurgitation. - Right ventricle: Pacer wire or catheter noted in right   ventricle. Systolic function was mildly reduced. - Tricuspid valve: Mild-moderate regurgitation. - Pulmonary arteries: PA peak pressure: 34mm Hg (S). Transthoracic echocardiography.

## 2012-09-22 NOTE — Assessment & Plan Note (Signed)
Ventricular paced today. Has an appointment in the device clinic at the end of the month. Stable.

## 2012-09-22 NOTE — Assessment & Plan Note (Signed)
Followed closely by Dr. Durwin Nora.

## 2012-09-22 NOTE — Assessment & Plan Note (Addendum)
Patient had recent emergency room visit with heart failure felt secondary to diastolic dysfunction and hypertensive heart disease. Her BNP was elevated. She was treated with IV Lasix and symptoms resolved. Ejection fraction was normal on 2-D echo. Continue low-dose Lasix 20 mg daily. Followup with Dr. Graciela Husbands in the device clinic at the end of the month. 2 g sodium diet.

## 2012-09-22 NOTE — Patient Instructions (Addendum)
Your physician has recommended you make the following change in your medication:   1.  Increase Lasix to 20 mg daily.  Your physician recommends that you schedule a follow-up appointment in: Dr. Graciela Husbands in 1 month  Start Sodium Diet:  2 Gram Low Sodium Diet A 2 gram sodium diet restricts the amount of sodium in the diet to no more than 2 g or 2000 mg daily. Limiting the amount of sodium is often used to help lower blood pressure. It is important if you have heart, liver, or kidney problems. Many foods contain sodium for flavor and sometimes as a preservative. When the amount of sodium in a diet needs to be low, it is important to know what to look for when choosing foods and drinks. The following includes some information and guidelines to help make it easier for you to adapt to a low sodium diet. QUICK TIPS  Do not add salt to food.  Avoid convenience items and fast food.  Choose unsalted snack foods.  Buy lower sodium products, often labeled as "lower sodium" or "no salt added."  Check food labels to learn how much sodium is in 1 serving.  When eating at a restaurant, ask that your food be prepared with less salt or none, if possible. READING FOOD LABELS FOR SODIUM INFORMATION The nutrition facts label is a good place to find how much sodium is in foods. Look for products with no more than 500 to 600 mg of sodium per meal and no more than 150 mg per serving. Remember that 2 g = 2000 mg. The food label may also list foods as:  Sodium-free: Less than 5 mg in a serving.  Very low sodium: 35 mg or less in a serving.  Low-sodium: 140 mg or less in a serving.  Light in sodium: 50% less sodium in a serving. For example, if a food that usually has 300 mg of sodium is changed to become light in sodium, it will have 150 mg of sodium.  Reduced sodium: 25% less sodium in a serving. For example, if a food that usually has 400 mg of sodium is changed to reduced sodium, it will have 300 mg of  sodium. CHOOSING FOODS Grains  Avoid: Salted crackers and snack items. Some cereals, including instant hot cereals. Bread stuffing and biscuit mixes. Seasoned rice or pasta mixes.  Choose: Unsalted snack items. Low-sodium cereals, oats, puffed wheat and rice, shredded wheat. English muffins and bread. Pasta. Meats  Avoid: Salted, canned, smoked, spiced, pickled meats, including fish and poultry. Bacon, ham, sausage, cold cuts, hot dogs, anchovies.  Choose: Low-sodium canned tuna and salmon. Fresh or frozen meat, poultry, and fish. Dairy  Avoid: Processed cheese and spreads. Cottage cheese. Buttermilk and condensed milk. Regular cheese.  Choose: Milk. Low-sodium cottage cheese. Yogurt. Sour cream. Low-sodium cheese. Fruits and Vegetables  Avoid: Regular canned vegetables. Regular canned tomato sauce and paste. Frozen vegetables in sauces. Olives. Rosita Fire. Relishes. Sauerkraut.  Choose: Low-sodium canned vegetables. Low-sodium tomato sauce and paste. Frozen or fresh vegetables. Fresh and frozen fruit. Condiments  Avoid: Canned and packaged gravies. Worcestershire sauce. Tartar sauce. Barbecue sauce. Soy sauce. Steak sauce. Ketchup. Onion, garlic, and table salt. Meat flavorings and tenderizers.  Choose: Fresh and dried herbs and spices. Low-sodium varieties of mustard and ketchup. Lemon juice. Tabasco sauce. Horseradish. SAMPLE 2 GRAM SODIUM MEAL PLAN Breakfast / Sodium (mg)  1 cup low-fat milk / 143 mg  2 slices whole-wheat toast / 270 mg  1 tbs  heart-healthy margarine / 153 mg  1 hard-boiled egg / 139 mg  1 small orange / 0 mg Lunch / Sodium (mg)  1 cup raw carrots / 76 mg   cup hummus / 298 mg  1 cup low-fat milk / 143 mg   cup red grapes / 2 mg  1 whole-wheat pita bread / 356 mg Dinner / Sodium (mg)  1 cup whole-wheat pasta / 2 mg  1 cup low-sodium tomato sauce / 73 mg  3 oz lean ground beef / 57 mg  1 small side salad (1 cup raw spinach leaves,  cup  cucumber,  cup yellow bell pepper) with 1 tsp olive oil and 1 tsp red wine vinegar / 25 mg Snack / Sodium (mg)  1 container low-fat vanilla yogurt / 107 mg  3 graham cracker squares / 127 mg Nutrient Analysis  Calories: 2033  Protein: 77 g  Carbohydrate: 282 g  Fat: 72 g  Sodium: 1971 mg Document Released: 02/03/2005 Document Revised: 04/28/2011 Document Reviewed: 05/07/2009 [employer] Patient Information 2014 [place], [place].

## 2012-09-28 ENCOUNTER — Encounter: Payer: Self-pay | Admitting: Vascular Surgery

## 2012-09-29 ENCOUNTER — Ambulatory Visit (INDEPENDENT_AMBULATORY_CARE_PROVIDER_SITE_OTHER): Payer: Medicare Other | Admitting: Vascular Surgery

## 2012-09-29 ENCOUNTER — Ambulatory Visit: Payer: Medicare Other | Admitting: Internal Medicine

## 2012-09-29 ENCOUNTER — Encounter: Payer: Self-pay | Admitting: Vascular Surgery

## 2012-09-29 VITALS — BP 160/50 | HR 70 | Temp 98.0°F | Ht 63.0 in | Wt 114.0 lb

## 2012-09-29 DIAGNOSIS — I739 Peripheral vascular disease, unspecified: Secondary | ICD-10-CM

## 2012-09-29 DIAGNOSIS — I7025 Atherosclerosis of native arteries of other extremities with ulceration: Secondary | ICD-10-CM | POA: Insufficient documentation

## 2012-09-29 DIAGNOSIS — L98499 Non-pressure chronic ulcer of skin of other sites with unspecified severity: Secondary | ICD-10-CM

## 2012-09-29 NOTE — Progress Notes (Signed)
[facility] [place]  Patient name: Regina Hamilton MRN: [id number] DOB: 1923-09-25 Sex: female  REASON FOR VISIT: ulcer on right fourth toe.  HPI: Regina Hamilton is a 79 y.o. female who I have been following with a left carotid stenosis. She was last seen on 05/26/2012. At that time she had a 60-79% left carotid stenosis with no significant stenosis on the right. She was due for a follow up visit in 6 months which would be October of this year. She comes in today as she has developed an ulcer on her right fourth toe. She was seen in an outlying emergency department when she had some drainage from her right fourth toe. It was recommended that she see her vascular surgeon. She denies rest pain. She is able to elevate her leg without rest pain also. She does not tolerate wearing compression stockings. She states that the wound her fourth toe has improved significantly after she began soaking it twice daily in Epson salt soaks.  Of note, in January of this year she underwent angioplasty of the right superficial femoral artery. She has single vessel runoff via the peroneal artery on the right.  Past Medical History  Diagnosis Date  . Peripheral vascular disease   . Carotid artery occlusion   . Hypertension   . Hyperlipidemia   . Arthritis   . Pacemaker 2008    [facility] in [place], [place]  . Complication of anesthesia   . Dysrhythmia     atrial fibrilation  . Anxiety   . GERD (gastroesophageal reflux disease)   . Chronic venous insufficiency     B   Family History  Problem Relation Age of Onset  . Heart disease Mother     Heart disease before age 43  . Heart disease Father     Heat Diease before age 53  . Heart disease Maternal Grandmother   . Heart disease Paternal Grandmother    SOCIAL HISTORY: History  Substance Use Topics  . Smoking status: Never Smoker   . Smokeless tobacco: Never Used  . Alcohol Use: No   Allergies  Allergen Reactions  .  Midazolam     REACTION: hallucinations per daughter  . Warfarin Sodium Rash    Unknown  . Alphagan P [Brimonidine Tartrate] Photosensitivity    Redness bilateral eye  . Oxycodone Hcl Itching  . Clonidine Derivatives Other (See Comments)    Pt's daughter states she has sever hallucinations  . Contrast Media [Iodinated Diagnostic Agents]     Contrast dye.  . Diltiazem Hcl     Unknown  . Hydralazine     itchig  . Iron     REACTION: RASH  . Meloxicam     Unknown  . Moxifloxacin     REACTION: Tongue swelling; mouth ulcers; confusion    Current Outpatient Prescriptions  Medication Sig Dispense Refill  . aliskiren (TEKTURNA) 300 MG tablet Take 0.5 tablets (150 mg total) by mouth daily.  90 tablet  3  . ALPRAZolam (XANAX) 0.25 MG tablet Take 0.25 mg by mouth at bedtime as needed for sleep.       Marland Kitchen amLODipine (NORVASC) 10 MG tablet Take 1 tablet (10 mg total) by mouth daily.  30 tablet  2  . B Complex-C-Folic Acid (STRESS FORMULA) TABS Take 1 tablet by mouth daily.      . cholecalciferol (VITAMIN D) 1000 UNITS tablet Take 2,000 Units by mouth daily.       . Dietary  Management Product (VASCULERA) TABS Take 1 tablet by mouth 2 (two) times daily.  60 tablet  11  . dorzolamide (TRUSOPT) 2 % ophthalmic solution Place 1 drop into the left eye 2 (two) times daily.       . furosemide (LASIX) 40 MG tablet Take 0.5 tablets (20 mg total) by mouth daily.  30 tablet  4  . gabapentin (NEURONTIN) 100 MG capsule Take 100 mg by mouth at bedtime.      . lactose free nutrition (BOOST) LIQD Take 237 mL by mouth 2 (two) times daily between meals.      Marland Kitchen lisinopril (PRINIVIL,ZESTRIL) 20 MG tablet Take 1 tablet (20 mg total) by mouth daily.  90 tablet  1  . loteprednol (LOTEMAX) 0.5 % ophthalmic suspension Place 1 drop into both eyes daily.      . Potassium Gluconate 2 MEQ TABS Take 2 each by mouth daily.      . traMADol (ULTRAM) 50 MG tablet Take 50 mg by mouth every 6 (six) hours as needed for pain.      Marland Kitchen  aspirin EC 81 MG tablet Take 81 mg by mouth daily.      . clopidogrel (PLAVIX) 75 MG tablet Take 75 mg by mouth daily.      . feeding supplement (ENSURE COMPLETE) LIQD Take 237 mLs by mouth 2 (two) times daily between meals.  60 Bottle  0   Current Facility-Administered Medications  Medication Dose Route Frequency Provider Last Rate Last Dose  . methylPREDNISolone acetate (DEPO-MEDROL) injection 40 mg  40 mg Intra-articular Once Tresa Garter, MD       REVIEW OF SYSTEMS: Arly.Keller ] denotes positive finding; [  ] denotes negative finding  CARDIOVASCULAR:  [ ]  chest pain   [ ]  chest pressure   [ ]  palpitations   [ ]  orthopnea   [ ]  dyspnea on exertion   [ ]  claudication   [ ]  rest pain   [ ]  DVT   [ ]  phlebitis PULMONARY:   [ ]  productive cough   [ ]  asthma   [ ]  wheezing NEUROLOGIC:   Arly.Keller ] weakness  [ ]  paresthesias  [ ]  aphasia  [ ]  amaurosis  [ ]  dizziness HEMATOLOGIC:   [ ]  bleeding problems   [ ]  clotting disorders MUSCULOSKELETAL:  [ ]  joint pain   [ ]  joint swelling Arly.Keller ] leg swelling GASTROINTESTINAL: [ ]   blood in stool  [ ]   hematemesis GENITOURINARY:  [ ]   dysuria  [ ]   hematuria PSYCHIATRIC:  [ ]  history of major depression INTEGUMENTARY:  [ ]  rashes  [ ]  ulcers CONSTITUTIONAL:  [ ]  fever   [ ]  chills  PHYSICAL EXAM: Filed Vitals:   09/29/12 0853  BP: 160/50  Pulse: 70  Temp: 98 F (36.7 C)  TempSrc: Oral  Height: 5\' 3"  (1.6 m)  Weight: 114 lb (51.71 kg)  SpO2: 99%   Body mass index is 20.2 kg/(m^2). GENERAL: The patient is a well-nourished female, in no acute distress. The vital signs are documented above. CARDIOVASCULAR: There is a regular rate and rhythm. He has a left carotid bruit. She has palpable femoral pulses. I cannot palpate pedal pulses. PULMONARY: There is good air exchange bilaterally without wheezing or rales. ABDOMEN: Soft and non-tender with normal pitched bowel sounds.  MUSCULOSKELETAL: There are no major deformities or cyanosis. NEUROLOGIC: No focal  weakness or paresthesias are detected. SKIN: Currently there is no ulcer on the right fourth toe.  There is no erythema or drainage. PSYCHIATRIC: The patient has a normal affect.  DATA:  I have reviewed her lower extremity arterial duplex study. This shows a short segment occlusion of the right superficial femoral artery with reconstitution of the popliteal artery and single vessel runoff via the peroneal artery. ABIs could not be obtained.  MEDICAL ISSUES:  PERIPHERAL VASCULAR DISEASE: This patient has significant infrainguinal arterial occlusive disease on the right however the wound on her toe has healed. She is 17 and is not a candidate for revascularization. If she did develop a wound she could be considered for repeat angioplasty of the superficial femoral artery stenosis which looks like it has reoccluded. A look at her foot when she returns in October for her carotid duplex study. She is due for a 6 month study in October as we are following a 60-79% left carotid stenosis. She remains asymptomatic from that standpoint.  DICKSON,CHRISTOPHER S [facility] [place] Beeper: [phone]

## 2012-10-03 DIAGNOSIS — M1990 Unspecified osteoarthritis, unspecified site: Secondary | ICD-10-CM

## 2012-10-03 DIAGNOSIS — R0989 Other specified symptoms and signs involving the circulatory and respiratory systems: Secondary | ICD-10-CM

## 2012-10-13 ENCOUNTER — Ambulatory Visit (INDEPENDENT_AMBULATORY_CARE_PROVIDER_SITE_OTHER): Payer: Medicare Other | Admitting: Internal Medicine

## 2012-10-13 ENCOUNTER — Encounter: Payer: Self-pay | Admitting: Internal Medicine

## 2012-10-13 VITALS — BP 152/50 | HR 72 | Temp 98.4°F | Resp 16 | Wt 117.0 lb

## 2012-10-13 DIAGNOSIS — I4891 Unspecified atrial fibrillation: Secondary | ICD-10-CM

## 2012-10-13 DIAGNOSIS — R609 Edema, unspecified: Secondary | ICD-10-CM

## 2012-10-13 DIAGNOSIS — L299 Pruritus, unspecified: Secondary | ICD-10-CM

## 2012-10-13 DIAGNOSIS — I10 Essential (primary) hypertension: Secondary | ICD-10-CM

## 2012-10-13 NOTE — Assessment & Plan Note (Signed)
Continue with current prescription therapy as reflected on the Med list.  

## 2012-10-13 NOTE — Assessment & Plan Note (Signed)
8/14 scalp - could be Tramadol related Will reduce the dose

## 2012-10-13 NOTE — Progress Notes (Signed)
   Subjective:     HPI  F/u itching all over- resolved off Hydralazine. Face was peeling on 300 mg Tekturna - resolved C/o scalp and eyebrows itching  C/o B legs new swelling and tenderness on 4/21. Dr Lynelle Doctor gave her Keflex x 2-3 wks, then Doxy (still on it) - it did not help. C/o PVD, tingling, pain. C/o LBP  BP Readings from Last 3 Encounters:  10/13/12 152/50  09/29/12 160/50  09/22/12 140/60   Wt Readings from Last 3 Encounters:  10/13/12 117 lb (53.071 kg)  09/29/12 114 lb (51.71 kg)  09/22/12 118 lb 12.8 oz (53.887 kg)     Review of Systems  Constitutional: Positive for fatigue. Negative for chills and unexpected weight change.  HENT: Positive for hearing loss. Negative for nosebleeds, congestion and sore throat.   Respiratory: Negative for cough, choking and wheezing.   Cardiovascular: Positive for leg swelling. Negative for chest pain and palpitations.  Gastrointestinal: Negative for nausea and abdominal pain.  Endocrine: Negative for polydipsia.  Genitourinary: Negative for flank pain, vaginal bleeding, vaginal discharge and pelvic pain.  Musculoskeletal: Positive for myalgias, arthralgias and gait problem. Negative for back pain and joint swelling.  Skin:       Redness B ankles, not warm or tender Toes are purplish B  Neurological: Negative for seizures and weakness.  Psychiatric/Behavioral: Negative for suicidal ideas and decreased concentration. The patient is not nervous/anxious.        Objective:   Physical Exam  Constitutional: She appears well-developed and well-nourished. No distress.  HENT:  Head: Normocephalic.  Nose: Nose normal.  Mouth/Throat: Oropharynx is clear and moist. No oropharyngeal exudate.  Eyes: Left eye exhibits no discharge. No scleral icterus.  Neck: No JVD present. No tracheal deviation present. No thyromegaly present.  Cardiovascular: Exam reveals no gallop and no friction rub.   Murmur heard. irreg  Pulmonary/Chest: She has no  wheezes. She has no rales. She exhibits no tenderness.  Abdominal: She exhibits no distension and no mass. There is no guarding.  Musculoskeletal: She exhibits edema. She exhibits no tenderness.  Trace B  Lymphadenopathy:    She has no cervical adenopathy.  Skin: No rash noted. There is erythema.  Redness B ankles, not warm or tender Toes are purplish B  Psychiatric: She has a normal mood and affect. Judgment normal.   Labs reviewed     Assessment & Plan:

## 2012-10-13 NOTE — Assessment & Plan Note (Signed)
Better  Lasix prn

## 2012-10-27 ENCOUNTER — Encounter: Payer: Self-pay | Admitting: Internal Medicine

## 2012-11-17 ENCOUNTER — Encounter: Payer: Medicare Other | Admitting: Internal Medicine

## 2012-11-19 ENCOUNTER — Encounter: Payer: Self-pay | Admitting: Internal Medicine

## 2012-11-19 ENCOUNTER — Ambulatory Visit (INDEPENDENT_AMBULATORY_CARE_PROVIDER_SITE_OTHER): Payer: Medicare Other | Admitting: Internal Medicine

## 2012-11-19 VITALS — BP 155/55 | HR 70 | Ht 60.0 in | Wt 116.0 lb

## 2012-11-19 DIAGNOSIS — I10 Essential (primary) hypertension: Secondary | ICD-10-CM

## 2012-11-19 DIAGNOSIS — I4891 Unspecified atrial fibrillation: Secondary | ICD-10-CM

## 2012-11-19 DIAGNOSIS — Z950 Presence of cardiac pacemaker: Secondary | ICD-10-CM

## 2012-11-19 DIAGNOSIS — I5032 Chronic diastolic (congestive) heart failure: Secondary | ICD-10-CM

## 2012-11-19 DIAGNOSIS — I442 Atrioventricular block, complete: Secondary | ICD-10-CM

## 2012-11-19 HISTORY — DX: Atrioventricular block, complete: I44.2

## 2012-11-19 LAB — PACEMAKER DEVICE OBSERVATION
BRDY-0004RV: 130 {beats}/min
RV LEAD THRESHOLD: 0.5 V

## 2012-11-19 NOTE — Patient Instructions (Addendum)
Your physician wants you to follow-up in: 6 months with device clinic. You will receive a reminder letter in the mail two months in advance. If you don't receive a letter, please call our office to schedule the follow-up appointment.  Your physician wants you to follow-up in: one year with Dr. Graciela Husbands.  You will receive a reminder letter in the mail two months in advance. If you don't receive a letter, please call our office to schedule the follow-up appointment.

## 2012-11-19 NOTE — Assessment & Plan Note (Signed)
Reasonably controlled at this point her own with diastolic hypotension

## 2012-11-19 NOTE — Assessment & Plan Note (Signed)
The patient's device was interrogated.  The information was reviewed. No changes were made in the programming.    

## 2012-11-19 NOTE — Assessment & Plan Note (Signed)
Permanent. She is on anticoagulation. They are averse to consideration of NOACs therapy  and will continue on aspirin and Plavix this as well as he vascular disease

## 2012-11-19 NOTE — Assessment & Plan Note (Signed)
Euvolemic. 

## 2012-11-19 NOTE — Assessment & Plan Note (Signed)
Stable post pacing 

## 2012-11-19 NOTE — Progress Notes (Signed)
Patient Care Team: Tresa Garter, MD as PCP - General (Internal Medicine) Shelda Pal, MD (Orthopedic Surgery) Duke Salvia, MD (Cardiology) Sherrell Puller, MD   HPI  Regina Hamilton is a 79 y.o. female Seen in followup for atrial fibrillation treated with AV junction ablation and pacemaker implantation 2010.   She was hospitalized July 2014 for acute on chronic HFpEF  Attempts to use anticoagulation with Coumadin failed because of a rash. She has been treated with aspirin.  She has peripheral vascular disease and underwent angioplasty of her lower extremities by Dr. Durwin Nora with improvement in her ambulation. She is walking  Echocardiogram 7/14 demonstrated normal left ventricular function  The patient denies chest pain, shortness of breath, nocturnal dyspnea, orthopnea or peripheral edema.  There have been no palpitations, lightheadedness or syncope.  Her limitations related to pain in her feet.   Past Medical History  Diagnosis Date  . Peripheral vascular disease   . Carotid artery occlusion   . Hypertension   . Hyperlipidemia   . Arthritis   . Pacemaker 2008    [facility] in [place], [place]  . Complication of anesthesia   . Dysrhythmia     atrial fibrilation  . Anxiety   . GERD (gastroesophageal reflux disease)   . Chronic venous insufficiency     B    Past Surgical History  Procedure Laterality Date  . Spine surgery  1970    Cervical fusion  . Eye surgery      Bilateral Cornea Transplants  . Joint replacement      Right & Left Total Hip   . Cardio ablation- pacemaker    . Pr vein bypass graft,aorto-fem-pop    . Total hip revision  04-08-2010  . Cholecystectomy  05/20/11  . Angioplasty  03/01/2012  . Insert / replace / remove pacemaker    . Corneal transplant Bilateral     Current Outpatient Prescriptions  Medication Sig Dispense Refill  . aliskiren (TEKTURNA) 300 MG tablet Take 0.5 tablets (150 mg total) by mouth daily.  90 tablet  3  .  ALPRAZolam (XANAX) 0.25 MG tablet Take 0.25 mg by mouth at bedtime as needed for sleep.       Marland Kitchen amLODipine (NORVASC) 10 MG tablet Take 1 tablet (10 mg total) by mouth daily.  30 tablet  2  . aspirin EC 81 MG tablet Take 81 mg by mouth daily.      . B Complex-C-Folic Acid (STRESS FORMULA) TABS Take 1 tablet by mouth daily.      . cholecalciferol (VITAMIN D) 1000 UNITS tablet Take 2,000 Units by mouth daily.       . clopidogrel (PLAVIX) 75 MG tablet Take 75 mg by mouth daily.      . dorzolamide (TRUSOPT) 2 % ophthalmic solution Place 1 drop into the left eye 2 (two) times daily.       . feeding supplement (ENSURE COMPLETE) LIQD Take 237 mLs by mouth 2 (two) times daily between meals.  60 Bottle  0  . furosemide (LASIX) 40 MG tablet Take 0.5 tablets (20 mg total) by mouth daily.  30 tablet  4  . gabapentin (NEURONTIN) 100 MG capsule Take 100 mg by mouth at bedtime.      Marland Kitchen lisinopril (PRINIVIL,ZESTRIL) 20 MG tablet Take 1 tablet (20 mg total) by mouth daily.  90 tablet  1  . Potassium Gluconate 2 MEQ TABS Take 2 each by mouth daily.      Marland Kitchen  traMADol (ULTRAM) 50 MG tablet Take 50 mg by mouth every 6 (six) hours as needed for pain.       Current Facility-Administered Medications  Medication Dose Route Frequency Provider Last Rate Last Dose  . methylPREDNISolone acetate (DEPO-MEDROL) injection 40 mg  40 mg Intra-articular Once Tresa Garter, MD        Allergies  Allergen Reactions  . Midazolam     REACTION: hallucinations per daughter  . Warfarin Sodium Rash    Unknown  . Alphagan P [Brimonidine Tartrate] Photosensitivity    Redness bilateral eye  . Oxycodone Hcl Itching  . Clonidine Derivatives Other (See Comments)    Pt's daughter states she has sever hallucinations  . Contrast Media [Iodinated Diagnostic Agents]     Contrast dye.  . Diltiazem Hcl     Unknown  . Hydralazine     itchig  . Iron     REACTION: RASH  . Meloxicam     Unknown  . Moxifloxacin     REACTION: Tongue  swelling; mouth ulcers; confusion    Review of Systems negative except from HPI and PMH  Physical Exam BP 155/55  Pulse 70  Ht 5' (1.524 m)  Wt 116 lb (52.617 kg)  BMI 22.65 kg/m2 Well developed and nourished in no acute distress HENT normal Neck supple with JVP-flat Clear Regular rate and rhythm, 2/ 6 systolic murmur Abd-soft with active BS No Clubbing cyanosis edema Skin-warm and dry; chronic venous insufficiency changes A & Oriented  Grossly normal sensory and motor function    ECG demonstrates atrial fibrillation with ventricular pacing

## 2012-11-24 ENCOUNTER — Encounter: Payer: Self-pay | Admitting: Internal Medicine

## 2012-12-01 ENCOUNTER — Ambulatory Visit: Payer: Medicare Other | Admitting: Vascular Surgery

## 2012-12-01 ENCOUNTER — Other Ambulatory Visit: Payer: Medicare Other

## 2012-12-14 ENCOUNTER — Encounter: Payer: Self-pay | Admitting: Vascular Surgery

## 2012-12-15 ENCOUNTER — Ambulatory Visit (INDEPENDENT_AMBULATORY_CARE_PROVIDER_SITE_OTHER): Payer: Medicare Other | Admitting: Vascular Surgery

## 2012-12-15 ENCOUNTER — Other Ambulatory Visit (HOSPITAL_COMMUNITY): Payer: Medicare Other

## 2012-12-15 ENCOUNTER — Ambulatory Visit (HOSPITAL_COMMUNITY)
Admission: RE | Admit: 2012-12-15 | Discharge: 2012-12-15 | Disposition: A | Discharge status: Home / Self Care | Payer: Medicare Other | Source: Ambulatory Visit | Attending: Vascular Surgery | Admitting: Vascular Surgery

## 2012-12-15 ENCOUNTER — Encounter: Payer: Self-pay | Admitting: Vascular Surgery

## 2012-12-15 DIAGNOSIS — I739 Peripheral vascular disease, unspecified: Secondary | ICD-10-CM

## 2012-12-15 DIAGNOSIS — I6529 Occlusion and stenosis of unspecified carotid artery: Secondary | ICD-10-CM | POA: Insufficient documentation

## 2012-12-15 NOTE — Progress Notes (Signed)
[facility]  Patient name: Regina Hamilton MRN: [id number] DOB: 03/29/1923 Sex: female  REASON FOR VISIT: follow up of peripheral vascular disease and carotid disease.  HPI: Regina Hamilton is a 79 y.o. female who I last saw on 09/29/2012. On 06/15/2012 she was noted to have a low and 60-79% left carotid stenosis. She comes in for a 6 month follow up visit. Since I saw her last she's had no history of stroke, TIAs, expressive or receptive aphasia, or amaurosis fugax.  She also has a history of peripheral vascular disease. She has had previous angioplasty of a small superficial femoral artery which is subtotally occluded. She has no significant claudication although her activity is fairly limited. Her only complaint is some pain in her right second toe and paresthesias in her toes at times. She denies any open wounds. She denies rest pain.  She also has a history of chronic venous insufficiency with no significant swelling recently. Past Medical History  Diagnosis Date  . Peripheral vascular disease   . Carotid artery occlusion   . Hypertension   . Hyperlipidemia   . Arthritis   . Pacemaker 2008    [facility] in [place], [place]  . Complication of anesthesia   . Dysrhythmia     atrial fibrilation  . Anxiety   . GERD (gastroesophageal reflux disease)   . Chronic venous insufficiency     B  . Atrioventricular block, complete s/p AV junction ablation 11/19/2012   Family History  Problem Relation Age of Onset  . Heart disease Mother     Heart disease before age 77  . Hypertension Mother   . Varicose Veins Mother   . Peripheral vascular disease Mother   . Other Mother     amputation  . Heart disease Father     Heat Diease before age 75  . Heart disease Maternal Grandmother   . Heart disease Paternal Grandmother   . Diabetes Sister   . Hypertension Sister   . Varicose Veins Sister   . Diabetes Brother   . Hypertension Brother   . Heart attack  Brother   . Hypertension Daughter    SOCIAL HISTORY: History  Substance Use Topics  . Smoking status: Never Smoker   . Smokeless tobacco: Never Used  . Alcohol Use: No   Allergies  Allergen Reactions  . Midazolam     REACTION: hallucinations per daughter  . Warfarin Sodium Rash    Unknown  . Alphagan P [Brimonidine Tartrate] Photosensitivity    Redness bilateral eye  . Oxycodone Hcl Itching  . Clonidine Derivatives Other (See Comments)    Pt's daughter states she has sever hallucinations  . Contrast Media [Iodinated Diagnostic Agents]     Contrast dye.  . Diltiazem Hcl     Unknown  . Hydralazine     itchig  . Iron     REACTION: RASH  . Meloxicam     Unknown  . Moxifloxacin     REACTION: Tongue swelling; mouth ulcers; confusion   Current Outpatient Prescriptions  Medication Sig Dispense Refill  . aliskiren (TEKTURNA) 300 MG tablet Take 0.5 tablets (150 mg total) by mouth daily.  90 tablet  3  . ALPRAZolam (XANAX) 0.25 MG tablet Take 0.25 mg by mouth at bedtime as needed for sleep.       Marland Kitchen amLODipine (NORVASC) 10 MG tablet Take 1 tablet (10 mg total) by mouth daily.  30 tablet  2  . aspirin  EC 81 MG tablet Take 81 mg by mouth daily.      . B Complex-C-Folic Acid (STRESS FORMULA) TABS Take 1 tablet by mouth daily.      . cholecalciferol (VITAMIN D) 1000 UNITS tablet Take 2,000 Units by mouth daily.       . clopidogrel (PLAVIX) 75 MG tablet Take 75 mg by mouth daily.      . dorzolamide (TRUSOPT) 2 % ophthalmic solution Place 1 drop into the left eye 2 (two) times daily.       . feeding supplement (ENSURE COMPLETE) LIQD Take 237 mLs by mouth 2 (two) times daily between meals.  60 Bottle  0  . furosemide (LASIX) 40 MG tablet Take 0.5 tablets (20 mg total) by mouth daily.  30 tablet  4  . gabapentin (NEURONTIN) 100 MG capsule Take 100 mg by mouth at bedtime.      Marland Kitchen lisinopril (PRINIVIL,ZESTRIL) 20 MG tablet Take 1 tablet (20 mg total) by mouth daily.  90 tablet  1  .  Potassium Gluconate 2 MEQ TABS Take 2 each by mouth daily.      . traMADol (ULTRAM) 50 MG tablet Take 50 mg by mouth every 6 (six) hours as needed for pain.       Current Facility-Administered Medications  Medication Dose Route Frequency Provider Last Rate Last Dose  . methylPREDNISolone acetate (DEPO-MEDROL) injection 40 mg  40 mg Intra-articular Once Tresa Garter, MD       REVIEW OF SYSTEMS: Arly.Keller ] denotes positive finding; [  ] denotes negative finding  CARDIOVASCULAR:  [ ]  chest pain   [ ]  chest pressure   [ ]  palpitations   [ ]  orthopnea   [ ]  dyspnea on exertion   Arly.Keller ] claudication   [ ]  rest pain   [ ]  DVT   [ ]  phlebitis PULMONARY:   [ ]  productive cough   [ ]  asthma   [ ]  wheezing NEUROLOGIC:   Arly.Keller ] weakness  [ ]  paresthesias  [ ]  aphasia  [ ]  amaurosis  [ ]  dizziness HEMATOLOGIC:   [ ]  bleeding problems   [ ]  clotting disorders MUSCULOSKELETAL:  [ ]  joint pain   [ ]  joint swelling [ ]  leg swelling GASTROINTESTINAL: [ ]   blood in stool  [ ]   hematemesis GENITOURINARY:  [ ]   dysuria  [ ]   hematuria PSYCHIATRIC:  [ ]  history of major depression INTEGUMENTARY:  [ ]  rashes  [ ]  ulcers CONSTITUTIONAL:  [ ]  fever   [ ]  chills  PHYSICAL EXAM: Filed Vitals:   12/15/12 0924 12/15/12 0927  BP: 177/57 161/50  Pulse: 70   Height: 5' (1.524 m)   Weight: 120 lb (54.432 kg)   SpO2: 100%    Body mass index is 23.44 kg/(m^2). GENERAL: The patient is a well-nourished female, in no acute distress. The vital signs are documented above. CARDIOVASCULAR: There is a regular rate and rhythm. She has a left carotid bruit. She has palpable femoral pulses are palpable left popliteal pulse. I cannot palpate a right popliteal pulse. I cannot palpate pedal pulses. He has no significant lower extremity swelling. PULMONARY: There is good air exchange bilaterally without wheezing or rales. ABDOMEN: Soft and non-tender with normal pitched bowel sounds.  MUSCULOSKELETAL: There are no major deformities or  cyanosis. NEUROLOGIC: No focal weakness or paresthesias are detected. SKIN: She has hyperpigmentation bilaterally consistent with chronic venous insufficiency. He has no open ulcers. PSYCHIATRIC: The patient has a  normal affect.  DATA:  I have independently interpreted her carotid duplex scan. By velocity criteria she is a 40-59% left carotid stenosis. Velocities on the left is slightly lower compared to the study 6 months ago. She has no significant right carotid stenosis.  In July, she has calcific vessels an ABI on the right could not be obtained. She had a monophasic peroneal signal with the Doppler.  MEDICAL ISSUES: CAROTID DISEASE: She has an asymptomatic 40-59% left carotid stenosis. I have recommended a follow carotid duplex scan in 1 year. She is on aspirin and is on Plavix.  PERIPHERAL VASCULAR DISEASE: She has an adequately perfused right foot despite severe infrainguinal arterial occlusive disease. Because of her age and somewhat fragile state she is not a good candidate for extensive revascularization. Therefore I would recommend simply following this in the she developed a nonhealing ulcer. I'll plan on seeing her back in 6 months with follow up ABIs. She knows to call sooner if she has problems.  Regina Hamilton S [facility] [place] Beeper: [phone]

## 2012-12-20 ENCOUNTER — Telehealth: Payer: Self-pay | Admitting: *Deleted

## 2012-12-20 MED ORDER — ALPRAZOLAM 0.25 MG PO TABS: 0.2500 mg | ORAL_TABLET | Freq: Every evening | ORAL | Status: DC | PRN

## 2012-12-20 NOTE — Telephone Encounter (Signed)
OK to fill this prescription with additional refills x1 Thank you!  

## 2012-12-20 NOTE — Telephone Encounter (Signed)
Spoke with pt advised Rx called in 

## 2012-12-20 NOTE — Telephone Encounter (Signed)
Pt called requesting Alprazolam refill be called into [facility] in [place] at [phone].  Please advise

## 2012-12-23 ENCOUNTER — Other Ambulatory Visit: Payer: Self-pay

## 2013-01-04 ENCOUNTER — Other Ambulatory Visit (INDEPENDENT_AMBULATORY_CARE_PROVIDER_SITE_OTHER): Payer: Medicare Other

## 2013-01-04 DIAGNOSIS — I4891 Unspecified atrial fibrillation: Secondary | ICD-10-CM

## 2013-01-04 DIAGNOSIS — R609 Edema, unspecified: Secondary | ICD-10-CM

## 2013-01-04 LAB — BASIC METABOLIC PANEL
Calcium: 9.6 mg/dL (ref 8.4–10.5)
Chloride: 106 mEq/L (ref 96–112)
Creatinine, Ser: 0.9 mg/dL (ref 0.4–1.2)
GFR: 62.88 mL/min (ref >60.00)

## 2013-01-17 ENCOUNTER — Other Ambulatory Visit: Payer: Self-pay | Admitting: *Deleted

## 2013-01-17 ENCOUNTER — Encounter: Payer: Self-pay | Admitting: Internal Medicine

## 2013-01-17 ENCOUNTER — Ambulatory Visit (INDEPENDENT_AMBULATORY_CARE_PROVIDER_SITE_OTHER): Payer: Medicare Other | Admitting: Internal Medicine

## 2013-01-17 ENCOUNTER — Other Ambulatory Visit (INDEPENDENT_AMBULATORY_CARE_PROVIDER_SITE_OTHER): Payer: Medicare Other

## 2013-01-17 VITALS — BP 160/68 | HR 76 | Temp 97.9°F | Resp 16 | Wt 122.0 lb

## 2013-01-17 DIAGNOSIS — R202 Paresthesia of skin: Secondary | ICD-10-CM

## 2013-01-17 DIAGNOSIS — I10 Essential (primary) hypertension: Secondary | ICD-10-CM

## 2013-01-17 DIAGNOSIS — G609 Hereditary and idiopathic neuropathy, unspecified: Secondary | ICD-10-CM

## 2013-01-17 DIAGNOSIS — I4891 Unspecified atrial fibrillation: Secondary | ICD-10-CM

## 2013-01-17 DIAGNOSIS — R209 Unspecified disturbances of skin sensation: Secondary | ICD-10-CM

## 2013-01-17 DIAGNOSIS — G629 Polyneuropathy, unspecified: Secondary | ICD-10-CM | POA: Insufficient documentation

## 2013-01-17 LAB — CBC WITH DIFFERENTIAL/PLATELET
Eosinophils Relative: 1.8 % (ref 0.0–5.0)
HCT: 36.2 % (ref 36.0–46.0)
Lymphs Abs: 2.3 10*3/uL (ref 0.7–4.0)
MCHC: 32.9 g/dL (ref 30.0–36.0)
MCV: 86.3 fl (ref 78.0–100.0)
Monocytes Absolute: 0.6 10*3/uL (ref 0.1–1.0)
Neutro Abs: 4.7 10*3/uL (ref 1.4–7.7)
Neutrophils Relative %: 60.6 % (ref 43.0–77.0)
Platelets: 211 10*3/uL (ref 150.0–400.0)
RDW: 14.7 % — ABNORMAL HIGH (ref 11.5–14.6)
WBC: 7.7 10*3/uL (ref 4.5–10.5)

## 2013-01-17 LAB — URINALYSIS
Bilirubin Urine: NEGATIVE
Hgb urine dipstick: NEGATIVE
Leukocytes, UA: NEGATIVE
Nitrite: NEGATIVE
Specific Gravity, Urine: 1.015 (ref 1.000–1.030)
Urine Glucose: NEGATIVE
Urobilinogen, UA: 0.2 (ref 0.0–1.0)
pH: 5.5 (ref 5.0–8.0)

## 2013-01-17 LAB — HEPATIC FUNCTION PANEL
ALT: 16 U/L (ref 0–35)
Albumin: 3.6 g/dL (ref 3.5–5.2)
Bilirubin, Direct: 0.1 mg/dL (ref 0.0–0.3)
Total Bilirubin: 0.6 mg/dL (ref 0.3–1.2)

## 2013-01-17 LAB — BASIC METABOLIC PANEL
BUN: 17 mg/dL (ref 6–23)
Chloride: 105 mEq/L (ref 96–112)
Glucose, Bld: 93 mg/dL (ref 70–99)
Potassium: 3.8 mEq/L (ref 3.5–5.1)
Sodium: 140 mEq/L (ref 135–145)

## 2013-01-17 LAB — VITAMIN B12: Vitamin B-12: 1185 pg/mL — ABNORMAL HIGH (ref 211–911)

## 2013-01-17 MED ORDER — LISINOPRIL 20 MG PO TABS: 20.0000 mg | ORAL_TABLET | Freq: Every day | ORAL | Status: DC

## 2013-01-17 MED ORDER — DORZOLAMIDE HCL 2 % OP SOLN: 1.0000 [drp] | Freq: Two times a day (BID) | OPHTHALMIC | Status: DC

## 2013-01-17 MED ORDER — AMLODIPINE BESYLATE 10 MG PO TABS: 10.0000 mg | ORAL_TABLET | Freq: Every day | ORAL | Status: DC

## 2013-01-17 MED ORDER — AMITRIPTYLINE HCL 10 MG PO TABS: 10.0000 mg | ORAL_TABLET | Freq: Every day | ORAL | Status: DC

## 2013-01-17 MED ORDER — FUROSEMIDE 20 MG PO TABS: 20.0000 mg | ORAL_TABLET | Freq: Every day | ORAL | Status: DC

## 2013-01-17 MED ORDER — ALPRAZOLAM 0.25 MG PO TABS: 0.2500 mg | ORAL_TABLET | Freq: Every evening | ORAL | Status: DC | PRN

## 2013-01-17 MED ORDER — GABAPENTIN 100 MG PO CAPS: 100.0000 mg | ORAL_CAPSULE | Freq: Every day | ORAL | Status: DC

## 2013-01-17 MED ORDER — TRAMADOL HCL 50 MG PO TABS: 50.0000 mg | ORAL_TABLET | Freq: Four times a day (QID) | ORAL | Status: DC | PRN

## 2013-01-17 MED ORDER — ALISKIREN FUMARATE 300 MG PO TABS: 300.0000 mg | ORAL_TABLET | Freq: Every day | ORAL | Status: DC

## 2013-01-17 NOTE — Assessment & Plan Note (Signed)
2013  Toes ?etiol S/p Podiatry eval  Will try Amitrpt Labs

## 2013-01-17 NOTE — Progress Notes (Signed)
   Subjective:     HPI  C/o R toes #3-4 pain - chronic  F/u itching all over- resolved off Hydralazine. Face was peeling on 300 mg Tekturna - resolved F/u scalp and eyebrows itching - resolved  F/u B legs new swelling and tenderness on 4/21 - better. Dr Lynelle Doctor gave her Keflex x 2-3 wks, then Doxy (still on it) - it did not help. C/o PVD, tingling, pain. C/o LBP  BP Readings from Last 3 Encounters:  01/17/13 160/68  12/15/12 161/50  11/19/12 155/55   Wt Readings from Last 3 Encounters:  01/17/13 122 lb (55.339 kg)  12/15/12 120 lb (54.432 kg)  11/19/12 116 lb (52.617 kg)     Review of Systems  Constitutional: Positive for fatigue. Negative for chills and unexpected weight change.  HENT: Positive for hearing loss. Negative for congestion, nosebleeds and sore throat.   Respiratory: Negative for cough, choking and wheezing.   Cardiovascular: Positive for leg swelling. Negative for chest pain and palpitations.  Gastrointestinal: Negative for nausea and abdominal pain.  Endocrine: Negative for polydipsia.  Genitourinary: Negative for flank pain, vaginal bleeding, vaginal discharge and pelvic pain.  Musculoskeletal: Positive for arthralgias, gait problem and myalgias. Negative for back pain and joint swelling.  Skin:       Redness B ankles, not warm or tender Toes are purplish B  Neurological: Negative for seizures and weakness.  Psychiatric/Behavioral: Negative for suicidal ideas and decreased concentration. The patient is not nervous/anxious.        Objective:   Physical Exam  Constitutional: She appears well-developed and well-nourished. No distress.  HENT:  Head: Normocephalic.  Nose: Nose normal.  Mouth/Throat: Oropharynx is clear and moist. No oropharyngeal exudate.  Eyes: Left eye exhibits no discharge. No scleral icterus.  Neck: No JVD present. No tracheal deviation present. No thyromegaly present.  Cardiovascular: Exam reveals no gallop and no friction rub.    Murmur heard. irreg  Pulmonary/Chest: She has no wheezes. She has no rales. She exhibits no tenderness.  Abdominal: She exhibits no distension and no mass. There is no guarding.  Musculoskeletal: She exhibits edema. She exhibits no tenderness.  Trace B  Lymphadenopathy:    She has no cervical adenopathy.  Skin: No rash noted. There is erythema.  Redness B ankles, not warm or tender Toes are purplish B  Psychiatric: She has a normal mood and affect. Judgment normal.   Labs reviewed     Assessment & Plan:

## 2013-01-17 NOTE — Assessment & Plan Note (Signed)
Continue with current prescription therapy as reflected on the Med list.  

## 2013-01-17 NOTE — Progress Notes (Signed)
Pre visit review using our clinic review tool, if applicable. No additional management support is needed unless otherwise documented below in the visit note. 

## 2013-02-07 ENCOUNTER — Telehealth: Payer: Self-pay | Admitting: Internal Medicine

## 2013-02-07 MED ORDER — AMITRIPTYLINE HCL 25 MG PO TABS: 25.0000 mg | ORAL_TABLET | Freq: Every day | ORAL | Status: DC

## 2013-02-07 NOTE — Telephone Encounter (Signed)
Needs Amitr 25 mg qhs per Dr Victorino Dike

## 2013-03-15 ENCOUNTER — Other Ambulatory Visit: Payer: Self-pay | Admitting: Dermatology

## 2013-03-24 ENCOUNTER — Encounter: Payer: Self-pay | Admitting: Internal Medicine

## 2013-03-24 ENCOUNTER — Ambulatory Visit (INDEPENDENT_AMBULATORY_CARE_PROVIDER_SITE_OTHER): Payer: Medicare Other | Admitting: Internal Medicine

## 2013-03-24 VITALS — BP 170/50 | HR 69 | Temp 97.0°F | Wt 116.4 lb

## 2013-03-24 DIAGNOSIS — I739 Peripheral vascular disease, unspecified: Secondary | ICD-10-CM

## 2013-03-24 MED ORDER — CEPHALEXIN 500 MG PO CAPS: 500.0000 mg | ORAL_CAPSULE | Freq: Four times a day (QID) | ORAL | Status: DC

## 2013-03-24 MED ORDER — PENTOXIFYLLINE ER 400 MG PO TBCR: 400.0000 mg | EXTENDED_RELEASE_TABLET | Freq: Three times a day (TID) | ORAL | Status: DC

## 2013-03-24 NOTE — Progress Notes (Signed)
Pre visit review using our clinic review tool, if applicable. No additional management support is needed unless otherwise documented below in the visit note. 

## 2013-03-24 NOTE — Patient Instructions (Addendum)
Peripheral vascular disease - you have what appears to be a vascular ulcer at the tip of the 2nd and 3rd toe right foot and a lesion at the lateral aspect of the 5th digit. There is hyperemia (redness) but this is not hot to touch and this does not look like cellulitis. There is a drop in temperature of the forefoot and but it is not cold.   Plan Trental 400 mg three times a day - a rheologic agent to make red blood cells more deformable and improve blood flow to toes  Soak foot twice a day for 5-10 minutes in water with a trace of betadine  Rinse and dray, place a little bacitracin or triple antibiotic ointment on the ulcer  Protect the toe from damage  Keflex 500 mg 4 times a day just in case there is any infection.  Follow up with Dr. Scot Dock.  Alarm signs: a hot foot - infection; a very cold foot - arterial and that would be an ED visit

## 2013-03-24 NOTE — Progress Notes (Signed)
Subjective:    Patient ID: Regina Hamilton, female    DOB: 1923-06-29, 80 y.o.   MRN: [id number]  HPI Mrs. Quito has known PVD. About 4 weeks ago she was seen by Dr. Jarome Matin who felt she had a vascular ulcer of the toe. OVer the past 24 hours her foot has become red-mottled, cooler to touch with increased ulceration per her daughter. She does not c/o pain except when her toe is bumped.  Past Medical History  Diagnosis Date  . Peripheral vascular disease   . Carotid artery occlusion   . Hypertension   . Hyperlipidemia   . Arthritis   . Pacemaker 2008    [facility] in [place], [place]  . Complication of anesthesia   . Dysrhythmia     atrial fibrilation  . Anxiety   . GERD (gastroesophageal reflux disease)   . Chronic venous insufficiency     B  . Atrioventricular block, complete s/p AV junction ablation 11/19/2012   Past Surgical History  Procedure Laterality Date  . Spine surgery  1970    Cervical fusion  . Eye surgery      Bilateral Cornea Transplants  . Joint replacement      Right & Left Total Hip   . Cardio ablation- pacemaker    . Pr vein bypass graft,aorto-fem-pop    . Total hip revision  04-08-2010  . Cholecystectomy  05/20/11  . Angioplasty  03/01/2012  . Insert / replace / remove pacemaker    . Corneal transplant Bilateral    Family History  Problem Relation Age of Onset  . Heart disease Mother     Heart disease before age 58  . Hypertension Mother   . Varicose Veins Mother   . Peripheral vascular disease Mother   . Other Mother     amputation  . Heart disease Father     Heat Diease before age 78  . Heart disease Maternal Grandmother   . Heart disease Paternal Grandmother   . Diabetes Sister   . Hypertension Sister   . Varicose Veins Sister   . Diabetes Brother   . Hypertension Brother   . Heart attack Brother   . Hypertension Daughter    History   Social History  . Marital Status: Widowed    Spouse Name: N/A    Number of Children:  N/A  . Years of Education: N/A   Occupational History  . Not on file.   Social History Main Topics  . Smoking status: Never Smoker   . Smokeless tobacco: Never Used  . Alcohol Use: No  . Drug Use: No  . Sexual Activity: Not on file   Other Topics Concern  . Not on file   Social History Narrative  . No narrative on file    Current Outpatient Prescriptions on File Prior to Visit  Medication Sig Dispense Refill  . aliskiren (TEKTURNA) 300 MG tablet Take 1 tablet (300 mg total) by mouth daily.  30 tablet  11  . ALPRAZolam (XANAX) 0.25 MG tablet Take 1 tablet (0.25 mg total) by mouth at bedtime as needed for sleep.  30 tablet  3  . amitriptyline (ELAVIL) 25 MG tablet Take 1 tablet (25 mg total) by mouth at bedtime.  30 tablet  5  . amLODipine (NORVASC) 10 MG tablet Take 1 tablet (10 mg total) by mouth daily.  30 tablet  2  . aspirin EC 81 MG tablet Take 81 mg by mouth daily.      Marland Kitchen  B Complex-C-Folic Acid (STRESS FORMULA) TABS Take 1 tablet by mouth daily.      . cholecalciferol (VITAMIN D) 1000 UNITS tablet Take 2,000 Units by mouth daily.       . clopidogrel (PLAVIX) 75 MG tablet Take 75 mg by mouth daily.      . dorzolamide (TRUSOPT) 2 % ophthalmic solution Place 1 drop into the left eye 2 (two) times daily.  10 mL  11  . feeding supplement (ENSURE COMPLETE) LIQD Take 237 mLs by mouth 2 (two) times daily between meals.  60 Bottle  0  . furosemide (LASIX) 20 MG tablet Take 1 tablet (20 mg total) by mouth daily.  30 tablet  6  . gabapentin (NEURONTIN) 100 MG capsule Take 1 capsule (100 mg total) by mouth at bedtime.  30 capsule  11  . lisinopril (PRINIVIL,ZESTRIL) 20 MG tablet Take 1 tablet (20 mg total) by mouth daily.  30 tablet  11  . Potassium Gluconate 2 MEQ TABS Take 2 each by mouth daily.      . traMADol (ULTRAM) 50 MG tablet Take 1 tablet (50 mg total) by mouth every 6 (six) hours as needed.  30 tablet  3   Current Facility-Administered Medications on File Prior to Visit    Medication Dose Route Frequency Provider Last Rate Last Dose  . methylPREDNISolone acetate (DEPO-MEDROL) injection 40 mg  40 mg Intra-articular Once Cassandria Anger, MD          Review of Systems System review is negative for any constitutional, cardiac, pulmonary, GI or neuro symptoms or complaints other than as described in the HPI.     Objective:   Physical Exam Filed Vitals:   03/24/13 1616  BP: 170/50  Pulse: 69  Temp: 97 F (36.1 C)   Gen'l - elderly woman in no acute distress or pain. HEETN- C&S clear, PERRLA Cor 2+ radial pulse, trace DP/PT pulses right foot. Slow capillary refill toes. Foot is NOT cold to touch. Minimal swelling. Pulm - normal respirations Derm - vascular ucler tip of 3rd toe right; small lesion lateral aspect 5th toe right. Neuro - A&O x 3       Assessment & Plan:

## 2013-03-27 NOTE — Assessment & Plan Note (Signed)
Peripheral vascular disease - you have what appears to be a vascular ulcer at the tip of the 2nd and 3rd toe right foot and a lesion at the lateral aspect of the 5th digit. There is hyperemia (redness) but this is not hot to touch and this does not look like cellulitis. There is a drop in temperature of the forefoot and but it is not cold.   Plan Trental 400 mg three times a day - a rheologic agent to make red blood cells more deformable and improve blood flow to toes  Soak foot twice a day for 5-10 minutes in water with a trace of betadine  Rinse and dray, place a little bacitracin or triple antibiotic ointment on the ulcer  Protect the toe from damage  Keflex 500 mg 4 times a day just in case there is any infection.  Follow up with Dr. Dickson.  Alarm signs: a hot foot - infection; a very cold foot - arterial and that would be an ED visit 

## 2013-03-29 ENCOUNTER — Other Ambulatory Visit: Payer: Self-pay | Admitting: *Deleted

## 2013-03-29 ENCOUNTER — Encounter: Payer: Self-pay | Admitting: Vascular Surgery

## 2013-03-29 DIAGNOSIS — L98499 Non-pressure chronic ulcer of skin of other sites with unspecified severity: Principal | ICD-10-CM

## 2013-03-29 DIAGNOSIS — M7989 Other specified soft tissue disorders: Secondary | ICD-10-CM

## 2013-03-29 DIAGNOSIS — I739 Peripheral vascular disease, unspecified: Secondary | ICD-10-CM

## 2013-03-30 ENCOUNTER — Encounter: Payer: Self-pay | Admitting: Vascular Surgery

## 2013-03-30 ENCOUNTER — Ambulatory Visit (INDEPENDENT_AMBULATORY_CARE_PROVIDER_SITE_OTHER): Payer: Medicare Other | Admitting: Vascular Surgery

## 2013-03-30 ENCOUNTER — Other Ambulatory Visit: Payer: Self-pay | Admitting: *Deleted

## 2013-03-30 ENCOUNTER — Other Ambulatory Visit (HOSPITAL_COMMUNITY): Payer: Medicare Other

## 2013-03-30 ENCOUNTER — Inpatient Hospital Stay (HOSPITAL_COMMUNITY): Admission: RE | Admit: 2013-03-30 | Payer: Medicare Other | Source: Ambulatory Visit

## 2013-03-30 ENCOUNTER — Ambulatory Visit (HOSPITAL_COMMUNITY)
Admission: RE | Admit: 2013-03-30 | Discharge: 2013-03-30 | Disposition: A | Discharge status: Home / Self Care | Payer: Medicare Other | Source: Ambulatory Visit | Attending: Vascular Surgery | Admitting: Vascular Surgery

## 2013-03-30 ENCOUNTER — Ambulatory Visit: Payer: Medicare Other | Admitting: Vascular Surgery

## 2013-03-30 ENCOUNTER — Ambulatory Visit (INDEPENDENT_AMBULATORY_CARE_PROVIDER_SITE_OTHER)
Admission: RE | Admit: 2013-03-30 | Discharge: 2013-03-30 | Disposition: A | Discharge status: Home / Self Care | Payer: Medicare Other | Source: Ambulatory Visit | Attending: Vascular Surgery | Admitting: Vascular Surgery

## 2013-03-30 VITALS — BP 157/59 | HR 70 | Ht 60.0 in | Wt 117.0 lb

## 2013-03-30 DIAGNOSIS — Z9889 Other specified postprocedural states: Secondary | ICD-10-CM | POA: Insufficient documentation

## 2013-03-30 DIAGNOSIS — M7989 Other specified soft tissue disorders: Secondary | ICD-10-CM

## 2013-03-30 DIAGNOSIS — L98499 Non-pressure chronic ulcer of skin of other sites with unspecified severity: Principal | ICD-10-CM

## 2013-03-30 DIAGNOSIS — I739 Peripheral vascular disease, unspecified: Secondary | ICD-10-CM

## 2013-03-30 DIAGNOSIS — L97509 Non-pressure chronic ulcer of other part of unspecified foot with unspecified severity: Secondary | ICD-10-CM | POA: Insufficient documentation

## 2013-03-30 DIAGNOSIS — Z888 Allergy status to other drugs, medicaments and biological substances status: Secondary | ICD-10-CM

## 2013-03-30 DIAGNOSIS — T508X5A Adverse effect of diagnostic agents, initial encounter: Secondary | ICD-10-CM

## 2013-03-30 DIAGNOSIS — I70209 Unspecified atherosclerosis of native arteries of extremities, unspecified extremity: Secondary | ICD-10-CM | POA: Insufficient documentation

## 2013-03-30 MED ORDER — DIPHENHYDRAMINE HCL 50 MG PO CAPS: ORAL_CAPSULE | ORAL | Status: DC

## 2013-03-30 MED ORDER — PREDNISONE 50 MG PO TABS: ORAL_TABLET | ORAL | Status: DC

## 2013-03-30 NOTE — Progress Notes (Signed)
[facility] [place]  Patient name: Regina Hamilton MRN: [id number] DOB: 03-15-1923 Sex: female  REASON FOR VISIT: Redness in wounds on her right foot.  HPI: Regina Hamilton is a 80 y.o. female with a history of peripheral vascular disease. She has had previous angioplasty of a small superficial femoral artery which subsequently occluded. Since I saw her last, she developed a wound on the lateral aspect of her right foot and also on the tips of her right second and third toes. She's also had some erythema. She describes rest pain in the right foot. Her activity is fairly limited.  Because of her age and somewhat fragile state she is not a good candidate for extensive revascularization.   I have reviewed her records from the [place] primary care. She was started on Trental but apparently had a reaction to this in the family stopped this.  Past Medical History  Diagnosis Date  . Peripheral vascular disease   . Carotid artery occlusion   . Hypertension   . Hyperlipidemia   . Arthritis   . Pacemaker 2008    [facility] in [place], [place]  . Complication of anesthesia   . Dysrhythmia     atrial fibrilation  . Anxiety   . GERD (gastroesophageal reflux disease)   . Chronic venous insufficiency     B  . Atrioventricular block, complete s/p AV junction ablation 11/19/2012   Family History  Problem Relation Age of Onset  . Heart disease Mother     Heart disease before age 53  . Hypertension Mother   . Varicose Veins Mother   . Peripheral vascular disease Mother   . Other Mother     amputation  . Heart disease Father     Heat Diease before age 32  . Heart disease Maternal Grandmother   . Heart disease Paternal Grandmother   . Diabetes Sister   . Hypertension Sister   . Varicose Veins Sister   . Diabetes Brother   . Hypertension Brother   . Heart attack Brother   . Hypertension Daughter    SOCIAL HISTORY: History  Substance Use Topics  . Smoking  status: Never Smoker   . Smokeless tobacco: Never Used  . Alcohol Use: No   Allergies  Allergen Reactions  . Midazolam     REACTION: hallucinations per daughter  . Pentoxifylline     Severe SOB, mental confusion per daughter  . Warfarin Sodium Rash    Unknown  . Alphagan P [Brimonidine Tartrate] Photosensitivity    Redness bilateral eye  . Oxycodone Hcl Itching  . Avelox [Moxifloxacin Hcl In Nacl] Other (See Comments)    tongue swelling  . Clonidine Derivatives Other (See Comments)    Pt's daughter states she has sever hallucinations  . Contrast Media [Iodinated Diagnostic Agents]     Contrast dye.  . Diltiazem Hcl Hives  . Hydralazine     itchig  . Iron     REACTION: RASH  . Meloxicam     Unknown  . Moxifloxacin     REACTION: Tongue swelling; mouth ulcers; confusion   Current Outpatient Prescriptions  Medication Sig Dispense Refill  . aliskiren (TEKTURNA) 300 MG tablet Take 150 mg by mouth daily.      Marland Kitchen ALPRAZolam (XANAX) 0.25 MG tablet Take 1 tablet (0.25 mg total) by mouth at bedtime as needed for sleep.  30 tablet  3  . amitriptyline (ELAVIL) 25 MG tablet Take 10 mg by mouth at  bedtime.      Marland Kitchen amLODipine (NORVASC) 10 MG tablet Take 1 tablet (10 mg total) by mouth daily.  30 tablet  2  . aspirin EC 81 MG tablet Take 81 mg by mouth daily.      . B Complex-C-Folic Acid (STRESS FORMULA) TABS Take 1 tablet by mouth daily.      . cephALEXin (KEFLEX) 500 MG capsule Take 1 capsule (500 mg total) by mouth 4 (four) times daily.  28 capsule  0  . cholecalciferol (VITAMIN D) 1000 UNITS tablet Take 2,000 Units by mouth daily.       . clopidogrel (PLAVIX) 75 MG tablet Take 75 mg by mouth daily.      . dorzolamide (TRUSOPT) 2 % ophthalmic solution Place 1 drop into the left eye 2 (two) times daily.  10 mL  11  . furosemide (LASIX) 20 MG tablet Take 10 mg by mouth daily.      Marland Kitchen gabapentin (NEURONTIN) 100 MG capsule Take 1 capsule (100 mg total) by mouth at bedtime.  30 capsule  11  .  lisinopril (PRINIVIL,ZESTRIL) 20 MG tablet Take 1 tablet (20 mg total) by mouth daily.  30 tablet  11  . traMADol (ULTRAM) 50 MG tablet Take 1 tablet (50 mg total) by mouth every 6 (six) hours as needed.  30 tablet  3  . feeding supplement (ENSURE COMPLETE) LIQD Take 237 mLs by mouth 2 (two) times daily between meals.  60 Bottle  0  . pentoxifylline (TRENTAL) 400 MG CR tablet Take 1 tablet (400 mg total) by mouth 3 (three) times daily with meals.  90 tablet  3  . Potassium Gluconate 2 MEQ TABS Take 2 each by mouth daily.       Current Facility-Administered Medications  Medication Dose Route Frequency Provider Last Rate Last Dose  . methylPREDNISolone acetate (DEPO-MEDROL) injection 40 mg  40 mg Intra-articular Once Cassandria Anger, MD       REVIEW OF SYSTEMS: Valu.Nieves ] denotes positive finding; [  ] denotes negative finding  CARDIOVASCULAR:  [ ]  chest pain   [ ]  chest pressure   [ ]  palpitations   [ ]  orthopnea   [ ]  dyspnea on exertion   [ ]  claudication   [ ]  rest pain   [ ]  DVT   [ ]  phlebitis PULMONARY:   [ ]  productive cough   [ ]  asthma   [ ]  wheezing NEUROLOGIC:   [ ]  weakness  [ ]  paresthesias  [ ]  aphasia  [ ]  amaurosis  [ ]  dizziness HEMATOLOGIC:   [ ]  bleeding problems   [ ]  clotting disorders MUSCULOSKELETAL:  [ ]  joint pain   [ ]  joint swelling Valu.Nieves ] leg swelling GASTROINTESTINAL: [ ]   blood in stool  [ ]   hematemesis GENITOURINARY:  [ ]   dysuria  [ ]   hematuria PSYCHIATRIC:  [ ]  history of major depression INTEGUMENTARY:  [ ]  rashes  Valu.Nieves ] ulcers CONSTITUTIONAL:  [ ]  fever   [ ]  chills  PHYSICAL EXAM: Filed Vitals:   03/30/13 1234  BP: 157/59  Pulse: 70  Height: 5' (1.524 m)  Weight: 117 lb (53.071 kg)  SpO2: 99%   Body mass index is 22.85 kg/(m^2). GENERAL: The patient is a well-nourished female, in no acute distress. The vital signs are documented above. CARDIOVASCULAR: There is a regular rate and rhythm. I do not detect carotid bruits. She has palpable femoral pulses.  I cannot palpate pedal pulses. She  has no significant lower extremity swelling. PULMONARY: There is good air exchange bilaterally without wheezing or rales. ABDOMEN: Soft and non-tender with normal pitched bowel sounds.  MUSCULOSKELETAL: There are no major deformities or cyanosis. NEUROLOGIC: No focal weakness or paresthesias are detected. SKIN: she has a small wound in the lateral aspect of her right foot where it looks like she had a skin care. She has small wounds on the tips of her right second and third toes. There is erythema in the forefoot. PSYCHIATRIC: The patient has a normal affect.  DATA:  I have independently interpreted her arterial Doppler study which shows a monophasic posterior tibial signal and a monophasic dorsalis pedis signal. She has a known occlusion of the right distal superficial femoral artery.  MEDICAL ISSUES:  Atherosclerosis of native arteries of the extremities with ulceration(440.23) This patient has nonhealing wounds of the right foot with known infrainguinal arterial occlusive disease. We have previously discussed the option of infrainguinal bypass but felt that she was fairly high risk because of her age. However currently she feels her symptoms are quite disabling and would like to reconsider this despite the risk involved. This reason we have discussed the option of proceeding with arteriography and she is agreeable with this. This does not commit her to a bypass. I have reviewed with the patient the indications for arteriography. In addition, I have reviewed the potential complications of arteriography including but not limited to: Bleeding, arterial injury, arterial thrombosis, dye action, renal insufficiency, or other unpredictable medical problems. I have explained to the patient that if we find disease amenable to angioplasty we could potentially address this at the same time. I have discussed the potential complications of angioplasty and stenting, including  but not limited to: Bleeding, arterial thrombosis, arterial injury, dissection, or the need for surgical intervention. We will make further recommendations pending these results. Her arteriogram is scheduled for 04/04/2013.    [facility] [place] Beeper: [phone]

## 2013-03-30 NOTE — Assessment & Plan Note (Signed)
This patient has nonhealing wounds of the right foot with known infrainguinal arterial occlusive disease. We have previously discussed the option of infrainguinal bypass but felt that she was fairly high risk because of her age. However currently she feels her symptoms are quite disabling and would like to reconsider this despite the risk involved. This reason we have discussed the option of proceeding with arteriography and she is agreeable with this. This does not commit her to a bypass. I have reviewed with the patient the indications for arteriography. In addition, I have reviewed the potential complications of arteriography including but not limited to: Bleeding, arterial injury, arterial thrombosis, dye action, renal insufficiency, or other unpredictable medical problems. I have explained to the patient that if we find disease amenable to angioplasty we could potentially address this at the same time. I have discussed the potential complications of angioplasty and stenting, including but not limited to: Bleeding, arterial thrombosis, arterial injury, dissection, or the need for surgical intervention. We will make further recommendations pending these results. Her arteriogram is scheduled for 04/04/2013.

## 2013-03-31 ENCOUNTER — Encounter (HOSPITAL_BASED_OUTPATIENT_CLINIC_OR_DEPARTMENT_OTHER): Payer: Medicare Other | Attending: Internal Medicine

## 2013-03-31 ENCOUNTER — Encounter (HOSPITAL_COMMUNITY): Payer: Self-pay | Admitting: Pharmacy Technician

## 2013-03-31 DIAGNOSIS — Z79899 Other long term (current) drug therapy: Secondary | ICD-10-CM | POA: Insufficient documentation

## 2013-03-31 DIAGNOSIS — Z947 Corneal transplant status: Secondary | ICD-10-CM | POA: Insufficient documentation

## 2013-03-31 DIAGNOSIS — I4891 Unspecified atrial fibrillation: Secondary | ICD-10-CM | POA: Insufficient documentation

## 2013-03-31 DIAGNOSIS — I10 Essential (primary) hypertension: Secondary | ICD-10-CM | POA: Insufficient documentation

## 2013-03-31 DIAGNOSIS — Z7982 Long term (current) use of aspirin: Secondary | ICD-10-CM | POA: Insufficient documentation

## 2013-03-31 DIAGNOSIS — Z9889 Other specified postprocedural states: Secondary | ICD-10-CM | POA: Insufficient documentation

## 2013-03-31 DIAGNOSIS — Z7901 Long term (current) use of anticoagulants: Secondary | ICD-10-CM | POA: Insufficient documentation

## 2013-03-31 DIAGNOSIS — I739 Peripheral vascular disease, unspecified: Secondary | ICD-10-CM | POA: Insufficient documentation

## 2013-03-31 DIAGNOSIS — L97509 Non-pressure chronic ulcer of other part of unspecified foot with unspecified severity: Secondary | ICD-10-CM | POA: Insufficient documentation

## 2013-03-31 DIAGNOSIS — M1990 Unspecified osteoarthritis, unspecified site: Secondary | ICD-10-CM | POA: Insufficient documentation

## 2013-03-31 DIAGNOSIS — Z981 Arthrodesis status: Secondary | ICD-10-CM | POA: Insufficient documentation

## 2013-03-31 DIAGNOSIS — Z950 Presence of cardiac pacemaker: Secondary | ICD-10-CM | POA: Insufficient documentation

## 2013-03-31 DIAGNOSIS — Z96649 Presence of unspecified artificial hip joint: Secondary | ICD-10-CM | POA: Insufficient documentation

## 2013-04-01 NOTE — Progress Notes (Signed)
[facility]  NAME:  Regina Hamilton, Regina Hamilton NO.:  [id number]  MEDICAL RECORD NO.:  [id number]      DATE OF BIRTH:  04-Apr-1923  PHYSICIAN:  Judene Companion, M.D.      VISIT DATE:  03/31/2013                                  OFFICE VISIT   HISTORY:  This is an 80 year old lady who comes to [place] with an ischemic ulcer on her right second and third toes on the lateral aspect of her right foot.  She has had multiple vascular procedures in the past.  The last being a left femoral-popliteal bypass graft.  She has had numerous arteriograms and angioplasties.  She has hypertension and she is on Coumadin, amlodipine, lisinopril, aspirin, Lasix, potassium, vitamins, hydrocodone, tramadol, and zinc.  She has a history along with the hypertension of atrial fibrillation, and of course, peripheral vascular disease and osteoarthritis.  She has had many surgical procedures including a total hip, cardiac ablation.  She has a pacemaker.  She has had a corneal transplant, cervical fusion, left knee arthrotomy, and a kyphoplasty.  She today had a blood pressure of 155/70, respirations 17, pulse 69, temperature 97.  She weighs 117 pounds.  I am going to treat this with silver alginate after I debrided her down to good bleeding tissue, and we will see how she goes, maybe will do a biologic graft of some kind, either Apligraf or Dermagraft.  DIAGNOSES: 1. Ischemic ulcers of her right second and third toes and lateral     right foot. 2. Hypertension. 3. Peripheral vascular disease. 4. Multiple surgical procedures.     Judene Companion, M.D.     PP/MEDQ  D:  03/31/2013  T:  04/01/2013  Job:  [id number]

## 2013-04-04 ENCOUNTER — Telehealth: Payer: Self-pay | Admitting: Vascular Surgery

## 2013-04-04 ENCOUNTER — Encounter (HOSPITAL_COMMUNITY)
Admission: RE | Disposition: A | Discharge status: Home / Self Care | Payer: Self-pay | Source: Ambulatory Visit | Attending: Vascular Surgery

## 2013-04-04 ENCOUNTER — Ambulatory Visit (HOSPITAL_COMMUNITY)
Admission: RE | Admit: 2013-04-04 | Discharge: 2013-04-04 | Disposition: A | Discharge status: Home / Self Care | Payer: Medicare Other | Source: Ambulatory Visit | Attending: Vascular Surgery | Admitting: Vascular Surgery

## 2013-04-04 DIAGNOSIS — Z950 Presence of cardiac pacemaker: Secondary | ICD-10-CM | POA: Insufficient documentation

## 2013-04-04 DIAGNOSIS — I739 Peripheral vascular disease, unspecified: Secondary | ICD-10-CM | POA: Insufficient documentation

## 2013-04-04 DIAGNOSIS — L98499 Non-pressure chronic ulcer of skin of other sites with unspecified severity: Principal | ICD-10-CM | POA: Insufficient documentation

## 2013-04-04 DIAGNOSIS — K219 Gastro-esophageal reflux disease without esophagitis: Secondary | ICD-10-CM | POA: Insufficient documentation

## 2013-04-04 DIAGNOSIS — F411 Generalized anxiety disorder: Secondary | ICD-10-CM | POA: Insufficient documentation

## 2013-04-04 DIAGNOSIS — Z7902 Long term (current) use of antithrombotics/antiplatelets: Secondary | ICD-10-CM | POA: Insufficient documentation

## 2013-04-04 DIAGNOSIS — I872 Venous insufficiency (chronic) (peripheral): Secondary | ICD-10-CM | POA: Insufficient documentation

## 2013-04-04 DIAGNOSIS — E785 Hyperlipidemia, unspecified: Secondary | ICD-10-CM | POA: Insufficient documentation

## 2013-04-04 DIAGNOSIS — M129 Arthropathy, unspecified: Secondary | ICD-10-CM | POA: Insufficient documentation

## 2013-04-04 DIAGNOSIS — Z7982 Long term (current) use of aspirin: Secondary | ICD-10-CM | POA: Insufficient documentation

## 2013-04-04 DIAGNOSIS — I10 Essential (primary) hypertension: Secondary | ICD-10-CM | POA: Insufficient documentation

## 2013-04-04 DIAGNOSIS — I6529 Occlusion and stenosis of unspecified carotid artery: Secondary | ICD-10-CM | POA: Insufficient documentation

## 2013-04-04 DIAGNOSIS — I4891 Unspecified atrial fibrillation: Secondary | ICD-10-CM | POA: Insufficient documentation

## 2013-04-04 DIAGNOSIS — L97509 Non-pressure chronic ulcer of other part of unspecified foot with unspecified severity: Secondary | ICD-10-CM | POA: Insufficient documentation

## 2013-04-04 HISTORY — PX: LOWER EXTREMITY ANGIOGRAM: SHX5508

## 2013-04-04 HISTORY — PX: ABDOMINAL AORTAGRAM: SHX5454

## 2013-04-04 HISTORY — PX: ABDOMINAL AORTAGRAM: SHX5706

## 2013-04-04 LAB — POCT I-STAT, CHEM 8
BUN: 21 mg/dL (ref 6–23)
CALCIUM ION: 1.33 mmol/L — AB (ref 1.13–1.30)
Chloride: 101 mEq/L (ref 96–112)
Creatinine, Ser: 1 mg/dL (ref 0.50–1.10)
Glucose, Bld: 179 mg/dL — ABNORMAL HIGH (ref 70–99)
HEMATOCRIT: 39 % (ref 36.0–46.0)
Hemoglobin: 13.3 g/dL (ref 12.0–15.0)
Potassium: 4 mEq/L (ref 3.7–5.3)
Sodium: 138 mEq/L (ref 137–147)
TCO2: 24 mmol/L (ref 0–100)

## 2013-04-04 SURGERY — ABDOMINAL AORTAGRAM
Anesthesia: LOCAL

## 2013-04-04 MED ORDER — FENTANYL CITRATE 0.05 MG/ML IJ SOLN
INTRAMUSCULAR | Status: AC
  Filled 2013-04-04: qty 2

## 2013-04-04 MED ORDER — SODIUM CHLORIDE 0.9 % IV SOLN: 1.0000 mL/kg/h | INTRAVENOUS | Status: DC

## 2013-04-04 MED ORDER — HEPARIN (PORCINE) IN NACL 2-0.9 UNIT/ML-% IJ SOLN
INTRAMUSCULAR | Status: AC
  Filled 2013-04-04: qty 1000

## 2013-04-04 MED ORDER — SODIUM CHLORIDE 0.9 % IV SOLN
INTRAVENOUS | Status: DC
  Administered 2013-04-04: 06:00:00 via INTRAVENOUS

## 2013-04-04 MED ORDER — LIDOCAINE HCL (PF) 1 % IJ SOLN
INTRAMUSCULAR | Status: AC
  Filled 2013-04-04: qty 30

## 2013-04-04 NOTE — Discharge Instructions (Signed)
Angiography, Care After ° °Refer to this sheet in the next few weeks. These instructions provide you with information on caring for yourself after your procedure. Your health care provider may also give you more specific instructions. Your treatment has been planned according to current medical practices, but problems sometimes occur. Call your health care provider if you have any problems or questions after your procedure.  °WHAT TO EXPECT AFTER THE PROCEDURE °After your procedure, it is typical to have the following sensations: °· Minor discomfort or tenderness and a small bump at the catheter insertion site. The bump should usually decrease in size and tenderness within 1 to 2 weeks. °· Any bruising will usually fade within 2 to 4 weeks. °HOME CARE INSTRUCTIONS  °· You may need to keep taking blood thinners if they were prescribed for you. Only take over-the-counter or prescription medicines for pain, fever, or discomfort as directed by your health care provider. °· Do not apply powder or lotion to the site. °· Do not sit in a bathtub, swimming pool, or whirlpool for 5 to 7 days. °· You may shower 24 hours after the procedure. Remove the bandage (dressing) and gently wash the site with plain soap and water. Gently pat the site dry. °· Inspect the site at least twice daily. °· Limit your activity for the first 24 hours. Do not bend, squat, or lift anything over 10 lb (9 kg) or as directed by your health care provider. °· Do not drive home if you are discharged the day of the procedure. Have someone else drive you. Follow instructions about when you can drive or return to work. °SEEK MEDICAL CARE IF: °· You get lightheaded when standing up. °· You have drainage (other than a small amount of blood on the dressing). °· You have chills. °· You have a fever. °· You have redness, warmth, swelling, or pain at the insertion site. °SEEK IMMEDIATE MEDICAL CARE IF:  °· You develop chest pain or shortness of breath, feel  faint, or pass out. °· You have bleeding, swelling larger than a walnut, or drainage from the catheter insertion site. °· You develop pain, discoloration, coldness, or severe bruising in the leg or arm that held the catheter. °· You have heavy bleeding from the site. If this happens, hold pressure on the site. °MAKE SURE YOU: °· Understand these instructions. °· Will watch your condition. °· Will get help right away if you are not doing well or get worse. °Document Released: 08/22/2004 Document Revised: 10/06/2012 Document Reviewed: 06/28/2012 °ExitCare® Patient Information ©2014 [employer]. ° °

## 2013-04-04 NOTE — H&P (View-Only) (Signed)
[facility] [place]  Patient name: Regina Hamilton MRN: [id number] DOB: 03-15-1923 Sex: female  REASON FOR VISIT: Redness in wounds on her right foot.  HPI: Regina Hamilton is a 80 y.o. female with a history of peripheral vascular disease. She has had previous angioplasty of a small superficial femoral artery which subsequently occluded. Since I saw her last, she developed a wound on the lateral aspect of her right foot and also on the tips of her right second and third toes. She's also had some erythema. She describes rest pain in the right foot. Her activity is fairly limited.  Because of her age and somewhat fragile state she is not a good candidate for extensive revascularization.   I have reviewed her records from the [facility] primary [facility]. She was started on Trental but apparently had a reaction to this in the family stopped this.  Past Medical History  Diagnosis Date  . Peripheral vascular disease   . Carotid artery occlusion   . Hypertension   . Hyperlipidemia   . Arthritis   . Pacemaker 2008    [facility] in [place], [place]  . Complication of anesthesia   . Dysrhythmia     atrial fibrilation  . Anxiety   . GERD (gastroesophageal reflux disease)   . Chronic venous insufficiency     B  . Atrioventricular block, complete s/p AV junction ablation 11/19/2012   Family History  Problem Relation Age of Onset  . Heart disease Mother     Heart disease before age 53  . Hypertension Mother   . Varicose Veins Mother   . Peripheral vascular disease Mother   . Other Mother     amputation  . Heart disease Father     Heat Diease before age 32  . Heart disease Maternal Grandmother   . Heart disease Paternal Grandmother   . Diabetes Sister   . Hypertension Sister   . Varicose Veins Sister   . Diabetes Brother   . Hypertension Brother   . Heart attack Brother   . Hypertension Daughter    SOCIAL HISTORY: History  Substance Use Topics  . Smoking  status: Never Smoker   . Smokeless tobacco: Never Used  . Alcohol Use: No   Allergies  Allergen Reactions  . Midazolam     REACTION: hallucinations per daughter  . Pentoxifylline     Severe SOB, mental confusion per daughter  . Warfarin Sodium Rash    Unknown  . Alphagan P [Brimonidine Tartrate] Photosensitivity    Redness bilateral eye  . Oxycodone Hcl Itching  . Avelox [Moxifloxacin Hcl In Nacl] Other (See Comments)    tongue swelling  . Clonidine Derivatives Other (See Comments)    Pt's daughter states she has sever hallucinations  . Contrast Media [Iodinated Diagnostic Agents]     Contrast dye.  . Diltiazem Hcl Hives  . Hydralazine     itchig  . Iron     REACTION: RASH  . Meloxicam     Unknown  . Moxifloxacin     REACTION: Tongue swelling; mouth ulcers; confusion   Current Outpatient Prescriptions  Medication Sig Dispense Refill  . aliskiren (TEKTURNA) 300 MG tablet Take 150 mg by mouth daily.      Marland Kitchen ALPRAZolam (XANAX) 0.25 MG tablet Take 1 tablet (0.25 mg total) by mouth at bedtime as needed for sleep.  30 tablet  3  . amitriptyline (ELAVIL) 25 MG tablet Take 10 mg by mouth at  bedtime.      Marland Kitchen amLODipine (NORVASC) 10 MG tablet Take 1 tablet (10 mg total) by mouth daily.  30 tablet  2  . aspirin EC 81 MG tablet Take 81 mg by mouth daily.      . B Complex-C-Folic Acid (STRESS FORMULA) TABS Take 1 tablet by mouth daily.      . cephALEXin (KEFLEX) 500 MG capsule Take 1 capsule (500 mg total) by mouth 4 (four) times daily.  28 capsule  0  . cholecalciferol (VITAMIN D) 1000 UNITS tablet Take 2,000 Units by mouth daily.       . clopidogrel (PLAVIX) 75 MG tablet Take 75 mg by mouth daily.      . dorzolamide (TRUSOPT) 2 % ophthalmic solution Place 1 drop into the left eye 2 (two) times daily.  10 mL  11  . furosemide (LASIX) 20 MG tablet Take 10 mg by mouth daily.      Marland Kitchen gabapentin (NEURONTIN) 100 MG capsule Take 1 capsule (100 mg total) by mouth at bedtime.  30 capsule  11  .  lisinopril (PRINIVIL,ZESTRIL) 20 MG tablet Take 1 tablet (20 mg total) by mouth daily.  30 tablet  11  . traMADol (ULTRAM) 50 MG tablet Take 1 tablet (50 mg total) by mouth every 6 (six) hours as needed.  30 tablet  3  . feeding supplement (ENSURE COMPLETE) LIQD Take 237 mLs by mouth 2 (two) times daily between meals.  60 Bottle  0  . pentoxifylline (TRENTAL) 400 MG CR tablet Take 1 tablet (400 mg total) by mouth 3 (three) times daily with meals.  90 tablet  3  . Potassium Gluconate 2 MEQ TABS Take 2 each by mouth daily.       Current Facility-Administered Medications  Medication Dose Route Frequency Provider Last Rate Last Dose  . methylPREDNISolone acetate (DEPO-MEDROL) injection 40 mg  40 mg Intra-articular Once Cassandria Anger, MD       REVIEW OF SYSTEMS: Valu.Nieves ] denotes positive finding; [  ] denotes negative finding  CARDIOVASCULAR:  [ ]  chest pain   [ ]  chest pressure   [ ]  palpitations   [ ]  orthopnea   [ ]  dyspnea on exertion   [ ]  claudication   [ ]  rest pain   [ ]  DVT   [ ]  phlebitis PULMONARY:   [ ]  productive cough   [ ]  asthma   [ ]  wheezing NEUROLOGIC:   [ ]  weakness  [ ]  paresthesias  [ ]  aphasia  [ ]  amaurosis  [ ]  dizziness HEMATOLOGIC:   [ ]  bleeding problems   [ ]  clotting disorders MUSCULOSKELETAL:  [ ]  joint pain   [ ]  joint swelling Valu.Nieves ] leg swelling GASTROINTESTINAL: [ ]   blood in stool  [ ]   hematemesis GENITOURINARY:  [ ]   dysuria  [ ]   hematuria PSYCHIATRIC:  [ ]  history of major depression INTEGUMENTARY:  [ ]  rashes  Valu.Nieves ] ulcers CONSTITUTIONAL:  [ ]  fever   [ ]  chills  PHYSICAL EXAM: Filed Vitals:   03/30/13 1234  BP: 157/59  Pulse: 70  Height: 5' (1.524 m)  Weight: 117 lb (53.071 kg)  SpO2: 99%   Body mass index is 22.85 kg/(m^2). GENERAL: The patient is a well-nourished female, in no acute distress. The vital signs are documented above. CARDIOVASCULAR: There is a regular rate and rhythm. I do not detect carotid bruits. She has palpable femoral pulses.  I cannot palpate pedal pulses. She  has no significant lower extremity swelling. PULMONARY: There is good air exchange bilaterally without wheezing or rales. ABDOMEN: Soft and non-tender with normal pitched bowel sounds.  MUSCULOSKELETAL: There are no major deformities or cyanosis. NEUROLOGIC: No focal weakness or paresthesias are detected. SKIN: she has a small wound in the lateral aspect of her right foot where it looks like she had a skin care. She has small wounds on the tips of her right second and third toes. There is erythema in the forefoot. PSYCHIATRIC: The patient has a normal affect.  DATA:  I have independently interpreted her arterial Doppler study which shows a monophasic posterior tibial signal and a monophasic dorsalis pedis signal. She has a known occlusion of the right distal superficial femoral artery.  MEDICAL ISSUES:  Atherosclerosis of native arteries of the extremities with ulceration(440.23) This patient has nonhealing wounds of the right foot with known infrainguinal arterial occlusive disease. We have previously discussed the option of infrainguinal bypass but felt that she was fairly high risk because of her age. However currently she feels her symptoms are quite disabling and would like to reconsider this despite the risk involved. This reason we have discussed the option of proceeding with arteriography and she is agreeable with this. This does not commit her to a bypass. I have reviewed with the patient the indications for arteriography. In addition, I have reviewed the potential complications of arteriography including but not limited to: Bleeding, arterial injury, arterial thrombosis, dye action, renal insufficiency, or other unpredictable medical problems. I have explained to the patient that if we find disease amenable to angioplasty we could potentially address this at the same time. I have discussed the potential complications of angioplasty and stenting, including  but not limited to: Bleeding, arterial thrombosis, arterial injury, dissection, or the need for surgical intervention. We will make further recommendations pending these results. Her arteriogram is scheduled for 04/04/2013.    [facility] [place] Beeper: [phone]

## 2013-04-04 NOTE — Telephone Encounter (Addendum)
Message copied by Gena Fray on Mon Apr 04, 2013  2:34 PM ------      Message from: Denman George      Created: Mon Apr 04, 2013 10:30 AM      Regarding: 3 mo f/u                   ----- Message -----         From: Angelia Mould, MD         Sent: 04/04/2013   8:50 AM           To: [facility] Charge Pool      Subject: charge and f/u                                           PROCEDURE:       1. Ultrasound-guided access to the left common femoral artery      2. Aortogram with bilateral iliac arteriogram      3. Selective catheterization of the right external iliac artery with right lower extremity runoff      4. Retrograde left femoral arteriogram with left lower extremity runoff            SURGEON: Judeth Cornfield. Scot Dock, MD, FACS            Her foot is being followed in the wound care center. I should probably see her in 3 months. Thank you. CD ------  04/04/13: pt is already scheduled for an appointment on 06/22/13 @ 10:00am- will keep this schedule, dpm

## 2013-04-04 NOTE — Op Note (Signed)
   PATIENT: Regina Hamilton   MRN: [id number] DOB: 03/31/1923    DATE OF PROCEDURE: 04/04/2013  INDICATIONS: BRIAH [name] is a 80 y.o. female who presents with wounds on her right foot and known severe infrainguinal arterial occlusive disease.  PROCEDURE:  1. Ultrasound-guided access to the left common femoral artery 2. Aortogram with bilateral iliac arteriogram 3. Selective catheterization of the right external iliac artery with right lower extremity runoff 4. Retrograde left femoral arteriogram with left lower extremity runoff  SURGEON: Judeth Cornfield. Scot Dock, MD, FACS  ANESTHESIA: local with sedation   EBL: minimal  TECHNIQUE: The patient was brought to the peripheral vascular lab and received 25 mcg of fentanyl. Both groins were prepped and draped in usual sterile fashion. Under ultrasound guidance, after the skin was anesthetized, the left common femoral artery was cannulated above the level of the femoropopliteal bypass graft. A 5 French sheath was introduced over the wire. A pigtail catheter was positioned at the L1 vertebral body and flush aortogram obtained. The catheter was exchanged for a crossover catheter which was positioned into the right common iliac artery. The wire was advanced into the external iliac artery and the crossover catheter exchanged for an endhole catheter. Selective right external iliac arteriogram was obtained with right lower extremity runoff. The catheter was then removed in a retrograde left femoral arteriogram obtained with left lower extremity runoff. At the completion of the procedure, the sheath was removed and pressure held for hemostasis. No immediate complications were noted.  FINDINGS:  1. The infrarenal aorta is widely patent as are both common iliac arteries, hypogastric arteries, and external iliac arteries. 2. On the right side, which is the symptomatic side, the common femoral, deep femoral, and superficial femoral artery are patent. The superficial  femoral artery has mild disease and is somewhat small but is patent to the adductor canal. There is occlusion of the distal superficial femoral artery and the right and the above-knee popliteal artery. There is reconstitution of the popliteal artery at the level of the knee which is occluded distally. The anterior tibial and posterior tibial arteries are occluded. The proximal peroneal was occluded with reconstitution of a severely diseased distal peroneal artery on the right. 3. On the left side, the common femoral and deep femoral artery are patent. The superficial femoral artery is occluded. There is a left femoral to above-knee popliteal artery bypass graft which is widely patent with no areas of stenosis within the graft. The pocket arteries patent focal tight stenosis at the level of the knee. The anterior tibial and posterior tibial arteries are occluded and a single vessel runoff on the left via the peroneal artery with some moderate diffuse disease of the proximal peroneal artery.  Deitra Mayo, MD, FACS [facility]  DATE OF DICTATION:   04/04/2013

## 2013-04-04 NOTE — Interval H&P Note (Signed)
History and Physical Interval Note:  04/04/2013 7:38 AM  Regina Hamilton  has presented today for surgery, with the diagnosis of PVD  The various methods of treatment have been discussed with the patient and family. After consideration of risks, benefits and other options for treatment, the patient has consented to  Procedure(s): ABDOMINAL AORTAGRAM (N/A) as a surgical intervention .  The patient's history has been reviewed, patient examined, no change in status, stable for surgery.  I have reviewed the patient's chart and labs.  Questions were answered to the patient's satisfaction.     Terrall Bley S

## 2013-04-07 ENCOUNTER — Telehealth: Payer: Self-pay | Admitting: Vascular Surgery

## 2013-04-07 ENCOUNTER — Encounter: Payer: Self-pay | Admitting: Vascular Surgery

## 2013-04-07 NOTE — Telephone Encounter (Addendum)
Message copied by Gena Fray on Thu Apr 07, 2013 10:36 AM ------      Message from: Peter Minium K      Created: Mon Apr 04, 2013  4:05 PM      Regarding: Schedule       Let me know if we need another referral order to wound care.       ----- Message -----         From: Sherrye Payor, RN         Sent: 04/04/2013  10:24 AM           To: Mena Goes, CMA      Subject: Micheline Rough                                                              ----- Message -----         From: Angelia Mould, MD         Sent: 04/04/2013   8:50 AM           To: [facility] Charge Pool      Subject: charge and f/u                                           PROCEDURE:       1. Ultrasound-guided access to the left common femoral artery      2. Aortogram with bilateral iliac arteriogram      3. Selective catheterization of the right external iliac artery with right lower extremity runoff      4. Retrograde left femoral arteriogram with left lower extremity runoff            SURGEON: Judeth Cornfield. Scot Dock, MD, FACS            Her foot is being followed in the wound care center. I should probably see her in 3 months. Thank you. CD       ------  FYI: Patient has follow up with Wound Care 04/08/13 @ 12:45pm, dpm

## 2013-04-18 ENCOUNTER — Ambulatory Visit: Payer: Medicare Other | Admitting: Internal Medicine

## 2013-04-22 ENCOUNTER — Encounter (HOSPITAL_BASED_OUTPATIENT_CLINIC_OR_DEPARTMENT_OTHER): Payer: Medicare Other | Attending: General Surgery

## 2013-04-22 DIAGNOSIS — L97509 Non-pressure chronic ulcer of other part of unspecified foot with unspecified severity: Secondary | ICD-10-CM | POA: Insufficient documentation

## 2013-04-25 ENCOUNTER — Telehealth: Payer: Self-pay | Admitting: Internal Medicine

## 2013-04-25 NOTE — Telephone Encounter (Signed)
Patient's daughter is calling to request a new rx for the patient's traMADol (ULTRAM) 50 MG tablet. She would like this sent to [facility] on [address]. Please advise.

## 2013-04-26 ENCOUNTER — Other Ambulatory Visit: Payer: Self-pay | Admitting: Internal Medicine

## 2013-04-26 NOTE — Telephone Encounter (Signed)
OK to fill this prescription with additional refills x1 Thank you!  

## 2013-04-27 MED ORDER — TRAMADOL HCL 50 MG PO TABS: 50.0000 mg | ORAL_TABLET | Freq: Four times a day (QID) | ORAL | Status: DC | PRN

## 2013-04-27 NOTE — Telephone Encounter (Signed)
Done. Pt's daughter informed.

## 2013-04-28 ENCOUNTER — Ambulatory Visit (INDEPENDENT_AMBULATORY_CARE_PROVIDER_SITE_OTHER)
Admission: RE | Admit: 2013-04-28 | Discharge: 2013-04-28 | Disposition: A | Discharge status: Home / Self Care | Payer: Medicare Other | Source: Ambulatory Visit | Attending: Internal Medicine | Admitting: Internal Medicine

## 2013-04-28 ENCOUNTER — Other Ambulatory Visit (INDEPENDENT_AMBULATORY_CARE_PROVIDER_SITE_OTHER): Payer: Medicare Other

## 2013-04-28 ENCOUNTER — Encounter: Payer: Self-pay | Admitting: Internal Medicine

## 2013-04-28 ENCOUNTER — Ambulatory Visit (INDEPENDENT_AMBULATORY_CARE_PROVIDER_SITE_OTHER): Payer: Medicare Other | Admitting: Internal Medicine

## 2013-04-28 VITALS — BP 166/50 | HR 70 | Temp 98.0°F | Wt 111.0 lb

## 2013-04-28 DIAGNOSIS — I10 Essential (primary) hypertension: Secondary | ICD-10-CM

## 2013-04-28 DIAGNOSIS — I739 Peripheral vascular disease, unspecified: Secondary | ICD-10-CM

## 2013-04-28 DIAGNOSIS — R0600 Dyspnea, unspecified: Secondary | ICD-10-CM

## 2013-04-28 DIAGNOSIS — R05 Cough: Secondary | ICD-10-CM

## 2013-04-28 DIAGNOSIS — R0609 Other forms of dyspnea: Secondary | ICD-10-CM

## 2013-04-28 DIAGNOSIS — R059 Cough, unspecified: Secondary | ICD-10-CM

## 2013-04-28 DIAGNOSIS — R7309 Other abnormal glucose: Secondary | ICD-10-CM

## 2013-04-28 DIAGNOSIS — R0989 Other specified symptoms and signs involving the circulatory and respiratory systems: Secondary | ICD-10-CM

## 2013-04-28 DIAGNOSIS — R509 Fever, unspecified: Secondary | ICD-10-CM

## 2013-04-28 DIAGNOSIS — R634 Abnormal weight loss: Secondary | ICD-10-CM

## 2013-04-28 DIAGNOSIS — L98499 Non-pressure chronic ulcer of skin of other sites with unspecified severity: Secondary | ICD-10-CM

## 2013-04-28 LAB — CBC WITH DIFFERENTIAL/PLATELET
BASOS ABS: 0 10*3/uL (ref 0.0–0.1)
BASOS PCT: 0.3 % (ref 0.0–3.0)
Eosinophils Absolute: 0.1 10*3/uL (ref 0.0–0.7)
Eosinophils Relative: 1.1 % (ref 0.0–5.0)
HCT: 37.7 % (ref 36.0–46.0)
HEMOGLOBIN: 12.5 g/dL (ref 12.0–15.0)
Lymphocytes Relative: 49.7 % — ABNORMAL HIGH (ref 12.0–46.0)
Lymphs Abs: 3 10*3/uL (ref 0.7–4.0)
MCHC: 33.2 g/dL (ref 30.0–36.0)
MCV: 84.9 fl (ref 78.0–100.0)
MONOS PCT: 7 % (ref 3.0–12.0)
Monocytes Absolute: 0.4 10*3/uL (ref 0.1–1.0)
NEUTROS ABS: 2.5 10*3/uL (ref 1.4–7.7)
Neutrophils Relative %: 41.9 % — ABNORMAL LOW (ref 43.0–77.0)
Platelets: 213 10*3/uL (ref 150.0–400.0)
RBC: 4.44 Mil/uL (ref 3.87–5.11)
RDW: 15.9 % — AB (ref 11.5–14.6)
WBC: 6.1 10*3/uL (ref 4.5–10.5)

## 2013-04-28 LAB — BASIC METABOLIC PANEL
BUN: 18 mg/dL (ref 6–23)
CHLORIDE: 104 meq/L (ref 96–112)
CO2: 28 mEq/L (ref 19–32)
Calcium: 9.4 mg/dL (ref 8.4–10.5)
Creatinine, Ser: 0.9 mg/dL (ref 0.4–1.2)
GFR: 65.34 mL/min (ref >60.00)
Glucose, Bld: 88 mg/dL (ref 70–99)
Potassium: 4.3 mEq/L (ref 3.5–5.1)
Sodium: 139 mEq/L (ref 135–145)

## 2013-04-28 LAB — HEMOGLOBIN A1C: HEMOGLOBIN A1C: 6.2 % (ref 4.6–6.5)

## 2013-04-28 LAB — TSH: TSH: 3.62 u[IU]/mL (ref 0.35–5.50)

## 2013-04-28 NOTE — Progress Notes (Signed)
Pre visit review using our clinic review tool, if applicable. No additional management support is needed unless otherwise documented below in the visit note. 

## 2013-04-28 NOTE — Patient Instructions (Addendum)
Your next office appointment will be determined based upon review of your pending labs & x-rays. Those instructions will be transmitted to you through My Chart .  04/29/13 Labs and chest x-ray were reviewed. There is no evidence of diabetes. There is no active cardio pulmonary process Options at this time include supplementation with agents such as Boost or Ensure. An occult malignancy workup could be pursued but she is extremely frail& intervention options limited. Another option would be a low dose antidepressant such as sertraline 25 mg daily. Blood pressure is suboptimally controlled despite 3 antihypertensive medications. I shall defer to her primary care physician as to medication adjustment.

## 2013-04-28 NOTE — Progress Notes (Signed)
Subjective:    Patient ID: Regina Hamilton, female    DOB: [birth year], 80 y.o.   MRN: [id number]  HPI Her daughter is concerned because of her fatigue which is chronic issue & is now associated with shortness of breath and a cough which sounds wet. Symptoms have been progressive over the last 4 days. She appears to be weaker to her daughter. She's also had ongoing weight loss. She had lost from 122 down to a weight of 111 over the last 8 months. She's had a 2 pound weight loss in the last week.   She has exertional dyspnea; she is unable to even make it to bed. She is not having paroxysmal nocturnal dyspnea. Apparently she is sleeping well at night with no excess snoring or apnea reported.  Her daughter describes decreased mood with anhedonia. Associated is some anxiety Low-grade fever 3/6 and 3/80  responded to  Coricidin. Her daughter may have had the flu.   She did have labs performed 04/04/13. A random glucose was 179 at that time. Hematocrit was 39. Her last TSH was 1.36 on 01/17/13.   Review of Systems  Specifically absent or sputum production, chest pain, palpitations, edema, or paroxysmal nocturnal dyspnea  She has no change in bowels. Melena and rectal bleeding are absent  She does have muscle weakness without associated joint swelling.  Her generalized weakness is associated with some imbalance.  She has had chronic pain in the right foot. She's been treated for ulcers of the foot at the wound clinic. She has an appointment tomorrow     Objective:   Physical Exam Gen.: She is frail and somewhat cachectic. She is in a wheelchair.  Eyes: No corneal or conjunctival inflammation noted. no scleral icterus present Nose: External nasal exam reveals no deformity or inflammation. Nasal mucosa are pink and moist. No lesions or exudates noted.   Neck: No deformities, masses, or tenderness noted.. Lungs: scattered very low-grade rhonchi. No increased work of breathing or distress    Heart: irregular rhythm ; no significant murmurs present. Abdomen: Bowel sounds normal; abdomen soft and nontender. No masses, organomegaly or hernias noted.                           Musculoskeletal/extremities: Limb atrophy present. No clubbing, cyanosis, or edema noted. Range of motion normal .Tone & strength decreased Vascular: Absent dorsalis pedis and  posterior tibial pulses  Neurologic: Alert and oriented x3. Deep tendon reflexes symmetrical and normal.  Gait normal  including heel & toe walking . Rhomberg & finger to nose       Skin: Ischemic changes over the feet.Dry ulcers with eschar of the second and third right toes and the lateral aspect of the right foot . There is faint erythema over the dorsal right foot. There is no increase in temperature to touch. With direct pressure the erythema blanches completely. There is slight tenderness to palpation of the right lateral foot                                                                   Lymph: No cervical, axillary lymphadenopathy present. Psych: Mood and affect are flat  . The daughter is the primary historian  although the patient will respond to direct questions.           Assessment & Plan:   #1 fatigue in the context of adult failure to thrive #2 dyspnea, exertional without paroxysmal nocturnal dyspnea.  #3 cough with recent fever #4weight loss #5 hyperglycemia #6 probable depression #7 HTN  Plan: See orders

## 2013-04-29 ENCOUNTER — Other Ambulatory Visit: Payer: Self-pay | Admitting: Internal Medicine

## 2013-04-29 ENCOUNTER — Encounter: Payer: Self-pay | Admitting: Internal Medicine

## 2013-04-29 DIAGNOSIS — R9389 Abnormal findings on diagnostic imaging of other specified body structures: Secondary | ICD-10-CM

## 2013-04-29 DIAGNOSIS — L97509 Non-pressure chronic ulcer of other part of unspecified foot with unspecified severity: Secondary | ICD-10-CM | POA: Diagnosis present

## 2013-04-29 DIAGNOSIS — R0609 Other forms of dyspnea: Principal | ICD-10-CM

## 2013-05-06 DIAGNOSIS — L97509 Non-pressure chronic ulcer of other part of unspecified foot with unspecified severity: Secondary | ICD-10-CM | POA: Diagnosis not present

## 2013-05-08 ENCOUNTER — Inpatient Hospital Stay (HOSPITAL_COMMUNITY)
Admission: EM | Admit: 2013-05-08 | Discharge: 2013-05-10 | DRG: 603 | Disposition: A | Discharge status: Home Health | Payer: Medicare Other | Attending: Internal Medicine | Admitting: Internal Medicine

## 2013-05-08 ENCOUNTER — Observation Stay (HOSPITAL_COMMUNITY): Payer: Medicare Other

## 2013-05-08 ENCOUNTER — Encounter (HOSPITAL_COMMUNITY): Payer: Self-pay | Admitting: Emergency Medicine

## 2013-05-08 ENCOUNTER — Emergency Department (HOSPITAL_COMMUNITY): Payer: Medicare Other

## 2013-05-08 DIAGNOSIS — H409 Unspecified glaucoma: Secondary | ICD-10-CM | POA: Diagnosis present

## 2013-05-08 DIAGNOSIS — L98499 Non-pressure chronic ulcer of skin of other sites with unspecified severity: Secondary | ICD-10-CM

## 2013-05-08 DIAGNOSIS — L03119 Cellulitis of unspecified part of limb: Principal | ICD-10-CM

## 2013-05-08 DIAGNOSIS — D62 Acute posthemorrhagic anemia: Secondary | ICD-10-CM

## 2013-05-08 DIAGNOSIS — I6529 Occlusion and stenosis of unspecified carotid artery: Secondary | ICD-10-CM | POA: Diagnosis present

## 2013-05-08 DIAGNOSIS — I4891 Unspecified atrial fibrillation: Secondary | ICD-10-CM

## 2013-05-08 DIAGNOSIS — I739 Peripheral vascular disease, unspecified: Secondary | ICD-10-CM | POA: Diagnosis present

## 2013-05-08 DIAGNOSIS — I10 Essential (primary) hypertension: Secondary | ICD-10-CM | POA: Diagnosis present

## 2013-05-08 DIAGNOSIS — M25511 Pain in right shoulder: Secondary | ICD-10-CM

## 2013-05-08 DIAGNOSIS — S91109A Unspecified open wound of unspecified toe(s) without damage to nail, initial encounter: Secondary | ICD-10-CM

## 2013-05-08 DIAGNOSIS — Z833 Family history of diabetes mellitus: Secondary | ICD-10-CM

## 2013-05-08 DIAGNOSIS — I442 Atrioventricular block, complete: Secondary | ICD-10-CM | POA: Diagnosis present

## 2013-05-08 DIAGNOSIS — E785 Hyperlipidemia, unspecified: Secondary | ICD-10-CM | POA: Diagnosis present

## 2013-05-08 DIAGNOSIS — Z950 Presence of cardiac pacemaker: Secondary | ICD-10-CM

## 2013-05-08 DIAGNOSIS — IMO0002 Reserved for concepts with insufficient information to code with codable children: Secondary | ICD-10-CM

## 2013-05-08 DIAGNOSIS — Z8249 Family history of ischemic heart disease and other diseases of the circulatory system: Secondary | ICD-10-CM

## 2013-05-08 DIAGNOSIS — K219 Gastro-esophageal reflux disease without esophagitis: Secondary | ICD-10-CM | POA: Diagnosis present

## 2013-05-08 DIAGNOSIS — E46 Unspecified protein-calorie malnutrition: Secondary | ICD-10-CM | POA: Diagnosis present

## 2013-05-08 DIAGNOSIS — Z79899 Other long term (current) drug therapy: Secondary | ICD-10-CM

## 2013-05-08 DIAGNOSIS — L0390 Cellulitis, unspecified: Secondary | ICD-10-CM

## 2013-05-08 DIAGNOSIS — I872 Venous insufficiency (chronic) (peripheral): Secondary | ICD-10-CM | POA: Diagnosis present

## 2013-05-08 DIAGNOSIS — F411 Generalized anxiety disorder: Secondary | ICD-10-CM | POA: Diagnosis present

## 2013-05-08 DIAGNOSIS — L02619 Cutaneous abscess of unspecified foot: Principal | ICD-10-CM

## 2013-05-08 DIAGNOSIS — L03115 Cellulitis of right lower limb: Secondary | ICD-10-CM | POA: Diagnosis present

## 2013-05-08 DIAGNOSIS — G629 Polyneuropathy, unspecified: Secondary | ICD-10-CM | POA: Diagnosis present

## 2013-05-08 DIAGNOSIS — L0291 Cutaneous abscess, unspecified: Secondary | ICD-10-CM

## 2013-05-08 DIAGNOSIS — I5032 Chronic diastolic (congestive) heart failure: Secondary | ICD-10-CM | POA: Diagnosis present

## 2013-05-08 HISTORY — DX: Myoneural disorder, unspecified: G70.9

## 2013-05-08 LAB — CBC WITH DIFFERENTIAL/PLATELET
BASOS ABS: 0.1 10*3/uL (ref 0.0–0.1)
BASOS PCT: 1 % (ref 0–1)
EOS ABS: 0.1 10*3/uL (ref 0.0–0.7)
EOS PCT: 1 % (ref 0–5)
HEMATOCRIT: 35.7 % — AB (ref 36.0–46.0)
HEMOGLOBIN: 11.5 g/dL — AB (ref 12.0–15.0)
Lymphocytes Relative: 24 % (ref 12–46)
Lymphs Abs: 2.2 10*3/uL (ref 0.7–4.0)
MCH: 27.4 pg (ref 26.0–34.0)
MCHC: 32.2 g/dL (ref 30.0–36.0)
MCV: 85.2 fL (ref 78.0–100.0)
MONO ABS: 0.8 10*3/uL (ref 0.1–1.0)
MONOS PCT: 8 % (ref 3–12)
Neutro Abs: 6 10*3/uL (ref 1.7–7.7)
Neutrophils Relative %: 66 % (ref 43–77)
Platelets: 298 10*3/uL (ref 150–400)
RBC: 4.19 MIL/uL (ref 3.87–5.11)
RDW: 15.6 % — AB (ref 11.5–15.5)
WBC: 9.1 10*3/uL (ref 4.0–10.5)

## 2013-05-08 LAB — LACTIC ACID, PLASMA: Lactic Acid, Venous: 1.1 mmol/L (ref 0.5–2.2)

## 2013-05-08 LAB — BASIC METABOLIC PANEL
BUN: 20 mg/dL (ref 6–23)
CHLORIDE: 103 meq/L (ref 96–112)
CO2: 26 mEq/L (ref 19–32)
Calcium: 9.8 mg/dL (ref 8.4–10.5)
Creatinine, Ser: 0.79 mg/dL (ref 0.50–1.10)
GFR calc Af Amer: 84 mL/min — ABNORMAL LOW (ref >90)
GFR calc non Af Amer: 73 mL/min — ABNORMAL LOW (ref >90)
GLUCOSE: 94 mg/dL (ref 70–99)
Potassium: 4.6 mEq/L (ref 3.7–5.3)
Sodium: 141 mEq/L (ref 137–147)

## 2013-05-08 MED ORDER — VANCOMYCIN HCL IN DEXTROSE 1-5 GM/200ML-% IV SOLN
1000.0000 mg | INTRAVENOUS | Status: DC
  Administered 2013-05-09: 1000 mg via INTRAVENOUS
  Filled 2013-05-08 (×2): qty 200

## 2013-05-08 MED ORDER — DOCUSATE SODIUM 100 MG PO CAPS
100.0000 mg | ORAL_CAPSULE | Freq: Two times a day (BID) | ORAL | Status: DC
  Administered 2013-05-08 – 2013-05-10 (×5): 100 mg via ORAL
  Filled 2013-05-08 (×7): qty 1

## 2013-05-08 MED ORDER — ACETAMINOPHEN 650 MG RE SUPP: 650.0000 mg | Freq: Four times a day (QID) | RECTAL | Status: DC | PRN

## 2013-05-08 MED ORDER — ALISKIREN FUMARATE 150 MG PO TABS: 150.0000 mg | ORAL_TABLET | Freq: Every day | ORAL | Status: DC

## 2013-05-08 MED ORDER — LISINOPRIL 10 MG PO TABS
10.0000 mg | ORAL_TABLET | Freq: Two times a day (BID) | ORAL | Status: DC
  Administered 2013-05-08 – 2013-05-10 (×4): 10 mg via ORAL
  Filled 2013-05-08 (×6): qty 1

## 2013-05-08 MED ORDER — PNEUMOCOCCAL VAC POLYVALENT 25 MCG/0.5ML IJ INJ
0.5000 mL | INJECTION | INTRAMUSCULAR | Status: DC
  Filled 2013-05-08 (×2): qty 0.5

## 2013-05-08 MED ORDER — HYDROMORPHONE HCL PF 1 MG/ML IJ SOLN
1.0000 mg | INTRAMUSCULAR | Status: DC | PRN
  Administered 2013-05-08 – 2013-05-10 (×9): 1 mg via INTRAVENOUS
  Filled 2013-05-08 (×9): qty 1

## 2013-05-08 MED ORDER — DORZOLAMIDE HCL 2 % OP SOLN
1.0000 [drp] | Freq: Two times a day (BID) | OPHTHALMIC | Status: DC
  Administered 2013-05-08 – 2013-05-10 (×4): 1 [drp] via OPHTHALMIC
  Filled 2013-05-08: qty 10

## 2013-05-08 MED ORDER — MORPHINE SULFATE 4 MG/ML IJ SOLN
4.0000 mg | INTRAMUSCULAR | Status: AC | PRN
  Administered 2013-05-08 (×2): 4 mg via INTRAVENOUS
  Filled 2013-05-08 (×2): qty 1

## 2013-05-08 MED ORDER — CLOPIDOGREL BISULFATE 75 MG PO TABS
75.0000 mg | ORAL_TABLET | Freq: Every day | ORAL | Status: DC
  Administered 2013-05-09 – 2013-05-10 (×2): 75 mg via ORAL
  Filled 2013-05-08 (×2): qty 1

## 2013-05-08 MED ORDER — HYDROMORPHONE HCL PF 1 MG/ML IJ SOLN: 0.5000 mg | INTRAMUSCULAR | Status: DC | PRN

## 2013-05-08 MED ORDER — HYDROCODONE-ACETAMINOPHEN 5-325 MG PO TABS
1.0000 | ORAL_TABLET | Freq: Four times a day (QID) | ORAL | Status: DC | PRN
  Administered 2013-05-09: 1 via ORAL
  Filled 2013-05-08: qty 1

## 2013-05-08 MED ORDER — AMLODIPINE BESYLATE 10 MG PO TABS
10.0000 mg | ORAL_TABLET | Freq: Every day | ORAL | Status: DC
  Administered 2013-05-09 – 2013-05-10 (×2): 10 mg via ORAL
  Filled 2013-05-08 (×2): qty 1

## 2013-05-08 MED ORDER — SODIUM CHLORIDE 0.9 % IV SOLN
INTRAVENOUS | Status: DC
Start: 2013-05-08 — End: 2013-05-10
  Administered 2013-05-08 – 2013-05-10 (×3): via INTRAVENOUS

## 2013-05-08 MED ORDER — HEPARIN SODIUM (PORCINE) 5000 UNIT/ML IJ SOLN
5000.0000 [IU] | Freq: Three times a day (TID) | INTRAMUSCULAR | Status: DC
  Administered 2013-05-08 – 2013-05-10 (×6): 5000 [IU] via SUBCUTANEOUS
  Filled 2013-05-08 (×9): qty 1

## 2013-05-08 MED ORDER — ALISKIREN FUMARATE 150 MG PO TABS
150.0000 mg | ORAL_TABLET | Freq: Every day | ORAL | Status: DC
  Administered 2013-05-08: 150 mg via ORAL
  Filled 2013-05-08 (×2): qty 1

## 2013-05-08 MED ORDER — TRAMADOL HCL 50 MG PO TABS: 50.0000 mg | ORAL_TABLET | Freq: Four times a day (QID) | ORAL | Status: DC | PRN

## 2013-05-08 MED ORDER — VITAMIN C 500 MG PO TABS
500.0000 mg | ORAL_TABLET | Freq: Every day | ORAL | Status: DC
  Administered 2013-05-08 – 2013-05-10 (×3): 500 mg via ORAL
  Filled 2013-05-08 (×3): qty 1

## 2013-05-08 MED ORDER — VANCOMYCIN HCL IN DEXTROSE 1-5 GM/200ML-% IV SOLN
1000.0000 mg | Freq: Once | INTRAVENOUS | Status: AC
  Administered 2013-05-08: 1000 mg via INTRAVENOUS
  Filled 2013-05-08: qty 200

## 2013-05-08 MED ORDER — LOTEPREDNOL ETABONATE 0.5 % OP SUSP
1.0000 [drp] | Freq: Every day | OPHTHALMIC | Status: DC
  Administered 2013-05-09 – 2013-05-10 (×2): 1 [drp] via OPHTHALMIC
  Filled 2013-05-08: qty 5

## 2013-05-08 MED ORDER — ALPRAZOLAM 0.25 MG PO TABS
0.2500 mg | ORAL_TABLET | Freq: Every evening | ORAL | Status: DC | PRN
  Administered 2013-05-08: 0.25 mg via ORAL
  Filled 2013-05-08: qty 1

## 2013-05-08 MED ORDER — GABAPENTIN 100 MG PO CAPS
100.0000 mg | ORAL_CAPSULE | Freq: Every day | ORAL | Status: DC
  Administered 2013-05-08 – 2013-05-09 (×2): 100 mg via ORAL
  Filled 2013-05-08 (×3): qty 1

## 2013-05-08 MED ORDER — ACETAMINOPHEN 325 MG PO TABS: 650.0000 mg | ORAL_TABLET | Freq: Four times a day (QID) | ORAL | Status: DC | PRN

## 2013-05-08 MED ORDER — POLYETHYLENE GLYCOL 3350 17 G PO PACK
17.0000 g | PACK | Freq: Every day | ORAL | Status: DC | PRN
  Filled 2013-05-08: qty 1

## 2013-05-08 MED ORDER — ONDANSETRON HCL 4 MG/2ML IJ SOLN
4.0000 mg | Freq: Four times a day (QID) | INTRAMUSCULAR | Status: DC | PRN
  Administered 2013-05-08 – 2013-05-09 (×2): 4 mg via INTRAVENOUS
  Filled 2013-05-08 (×2): qty 2

## 2013-05-08 MED ORDER — ASPIRIN EC 81 MG PO TBEC
81.0000 mg | DELAYED_RELEASE_TABLET | Freq: Every day | ORAL | Status: DC
  Administered 2013-05-09 – 2013-05-10 (×2): 81 mg via ORAL
  Filled 2013-05-08 (×2): qty 1

## 2013-05-08 NOTE — ED Notes (Signed)
Pt from home, daughter reports that pt had R foot wound debridement x2 days ago. Pt daughter is a Therapist, sports and has been giving pt 2 Norco, 50 tramadol and .25 xanax for pain with no relief. Pt reports 10/10 pain. Daughter is afraid of "overdosing" pt and is here for pain relief. Pt last meds were 3:30 am this morning. Pt is A&O and in NAD.

## 2013-05-08 NOTE — Progress Notes (Signed)
ANTIBIOTIC CONSULT NOTE - INITIAL  Pharmacy Consult for Vancomycin  Indication: Cellulitis  Allergies  Allergen Reactions  . Latanoprost Anaphylaxis    redness  . Pentoxifylline     Severe SOB, mental confusion per daughter  . Versed [Midazolam]     REACTION: hallucinations per daughter  . Warfarin Sodium Rash    Unknown  . Alphagan P [Brimonidine Tartrate] Photosensitivity    Redness bilateral eye  . Oxycodone Hcl Itching  . Avelox [Moxifloxacin Hcl In Nacl] Other (See Comments)    tongue swelling  . Clonidine Derivatives Other (See Comments)    Pt's daughter states she has sever hallucinations  . Contrast Media [Iodinated Diagnostic Agents]     Contrast dye.  . Diltiazem Hcl Hives  . Doxycycline Nausea And Vomiting  . Hydralazine     itchig  . Iron     REACTION: RASH  . Meloxicam     Unknown  . Moxifloxacin     REACTION: Tongue swelling; mouth ulcers; confusion   Patient Measurements:   TBW 50kg  Vital Signs: Temp: 98.2 F (36.8 C) (03/22 1028) Temp src: Oral (03/22 1028) BP: 163/49 mmHg (03/22 1028) Pulse Rate: 69 (03/22 1028) Intake/Output from previous day:   Intake/Output from this shift:    Labs:  Recent Labs  05/08/13 1130  WBC 9.1  HGB 11.5*  PLT 298  CREATININE 0.79   The CrCl is unknown because both a height and weight (above a minimum accepted value) are required for this calculation. No results found for this basename: VANCOTROUGH, VANCOPEAK, VANCORANDOM, GENTTROUGH, GENTPEAK, GENTRANDOM, TOBRATROUGH, TOBRAPEAK, TOBRARND, AMIKACINPEAK, AMIKACINTROU, AMIKACIN,  in the last 72 hours   Microbiology: No results found for this or any previous visit (from the past 720 hour(s)).  Medical History: Past Medical History  Diagnosis Date  . Peripheral vascular disease   . Carotid artery occlusion   . Hypertension   . Hyperlipidemia   . Arthritis   . Pacemaker 2008    [facility] in [place], [place]  . Complication of anesthesia    . Dysrhythmia     atrial fibrilation  . Anxiety   . GERD (gastroesophageal reflux disease)   . Chronic venous insufficiency     B  . Atrioventricular block, complete s/p AV junction ablation 11/19/2012   Medications:  Scheduled:  . docusate sodium  100 mg Oral BID  . heparin  5,000 Units Subcutaneous 3 times per day  . methylPREDNISolone acetate  40 mg Intra-articular Once   Anti-infectives   Start     Dose/Rate Route Frequency Ordered Stop   05/08/13 1245  vancomycin (VANCOCIN) IVPB 1000 mg/200 mL premix     1,000 mg 200 mL/hr over 60 Minutes Intravenous  Once 05/08/13 1230       Assessment: 27 yoF with chronic R foot ulcers, debrided 3/20, no abx noted. To ED for pain control, to begin IV Vancomycin, dosed per Pharmacy.  No cultures ordered, XRay RLE with no bone involvement  Goal of Therapy:  Vancomycin trough level 10-15 mcg/ml  Plan:   Vancomycin 1gm x1 to ED, follow with 1gm q24 for cellulitits  Follow clinical course, adjust dose if needed.  Minda Ditto PharmD Pager [phone] 05/08/2013, 1:46 PM

## 2013-05-08 NOTE — ED Provider Notes (Signed)
CSN: [id number]     Arrival date & time 05/08/13  3875 History   First MD Initiated Contact with Patient 05/08/13 1040     Chief Complaint  Patient presents with  . Foot Pain      HPI Pt was seen at 1045. Per pt and her daughter, c/o gradual onset and worsening of right toes and foot "pain," "swelling," and "red rash" for the past 2 days. Pt had shallow ulcers to her right 2nd, 3rd toes and right lateral 5th MTP area debrided 2 days ago. Pt's daughter states pt's symptoms began shortly after that procedure. Pt describes the pain as "burning" and "like a hot poker sticking me." Pt's daughter has been dosing pt with 2 norco q4h, tramadol, and xanax without relief. Denies injury, no fevers, no focal motor weakness.    Past Medical History  Diagnosis Date  . Peripheral vascular disease   . Carotid artery occlusion   . Hypertension   . Hyperlipidemia   . Arthritis   . Pacemaker 2008    [facility] in [place], [place]  . Complication of anesthesia   . Dysrhythmia     atrial fibrilation  . Anxiety   . GERD (gastroesophageal reflux disease)   . Chronic venous insufficiency     B  . Atrioventricular block, complete s/p AV junction ablation 11/19/2012   Past Surgical History  Procedure Laterality Date  . Spine surgery  1970    Cervical fusion  . Cardio ablation- pacemaker    . Pr vein bypass graft,aorto-fem-pop    . Total hip revision      bilateral  . Cholecystectomy  05/20/11  . Angioplasty  03/01/2012  . Insert / replace / remove pacemaker    . Corneal transplant Bilateral    Family History  Problem Relation Age of Onset  . Heart disease Mother     Heart disease before age 53  . Hypertension Mother   . Varicose Veins Mother   . Peripheral vascular disease Mother   . Other Mother     amputation  . Heart disease Father     Heat Diease before age 42  . Heart disease Maternal Grandmother   . Heart disease Paternal Grandmother   . Diabetes Sister   . Hypertension  Sister   . Varicose Veins Sister   . Diabetes Brother   . Hypertension Brother   . Heart attack Brother   . Hypertension Daughter    History  Substance Use Topics  . Smoking status: Never Smoker   . Smokeless tobacco: Never Used  . Alcohol Use: No    Review of Systems ROS: Statement: All systems negative except as marked or noted in the HPI; Constitutional: Negative for fever and chills. ; ; Eyes: Negative for eye pain, redness and discharge. ; ; ENMT: Negative for ear pain, hoarseness, nasal congestion, sinus pressure and sore throat. ; ; Cardiovascular: Negative for chest pain, palpitations, diaphoresis, dyspnea and peripheral edema. ; ; Respiratory: Negative for cough, wheezing and stridor. ; ; Gastrointestinal: Negative for nausea, vomiting, diarrhea, abdominal pain, blood in stool, hematemesis, jaundice and rectal bleeding. . ; ; Genitourinary: Negative for dysuria, flank pain and hematuria. ; ; Musculoskeletal: +right toes and foot pain. Negative for back pain and neck pain. Negative for swelling and trauma.; ; Skin: +rash. Negative for pruritus, abrasions, blisters, bruising and skin lesion.; ; Neuro: Negative for headache, lightheadedness and neck stiffness. Negative for weakness, altered level of consciousness , altered mental status, extremity  weakness, paresthesias, involuntary movement, seizure and syncope.      Allergies  Latanoprost; Pentoxifylline; Versed; Warfarin sodium; Alphagan p; Oxycodone hcl; Avelox; Clonidine derivatives; Contrast media; Diltiazem hcl; Doxycycline; Hydralazine; Iron; Meloxicam; and Moxifloxacin  Home Medications   Current Outpatient Rx  Name  Route  Sig  Dispense  Refill  . aliskiren (TEKTURNA) 300 MG tablet   Oral   Take 150 mg by mouth daily.         Marland Kitchen ALPRAZolam (XANAX) 0.25 MG tablet   Oral   Take 1 tablet (0.25 mg total) by mouth at bedtime as needed for sleep.   30 tablet   3   . amLODipine (NORVASC) 10 MG tablet   Oral   Take 1  tablet (10 mg total) by mouth daily.   30 tablet   2   . aspirin EC 81 MG tablet   Oral   Take 81 mg by mouth daily.         . B Complex-C-Folic Acid (STRESS FORMULA) TABS   Oral   Take 1 tablet by mouth daily.         . cholecalciferol (VITAMIN D) 1000 UNITS tablet   Oral   Take 4,000 Units by mouth daily.          . clopidogrel (PLAVIX) 75 MG tablet   Oral   Take 75 mg by mouth daily.         . diphenhydrAMINE (BENADRYL) 50 MG capsule      Take 50 mg by mouth 1 hour prior to your procedure.   1 capsule   0   . dorzolamide (TRUSOPT) 2 % ophthalmic solution   Left Eye   Place 1 drop into the left eye 2 (two) times daily.   10 mL   11   . furosemide (LASIX) 20 MG tablet   Oral   Take 10 mg by mouth every other day.          . gabapentin (NEURONTIN) 100 MG capsule   Oral   Take 1 capsule (100 mg total) by mouth at bedtime.   30 capsule   11   . HYDROcodone-acetaminophen (NORCO/VICODIN) 5-325 MG per tablet   Oral   Take 1 tablet by mouth every 6 (six) hours as needed for moderate pain.         Marland Kitchen lisinopril (PRINIVIL,ZESTRIL) 20 MG tablet   Oral   Take 10 mg by mouth 2 (two) times daily.         Marland Kitchen loteprednol (LOTEMAX) 0.5 % ophthalmic suspension   Both Eyes   Place 1 drop into both eyes daily.         . Potassium Gluconate 2 MEQ TABS   Oral   Take 1 each by mouth daily.          . predniSONE (DELTASONE) 50 MG tablet      One tablet (50mg ) 13 hours prior to procedure; one tablet (50mg ) 7 hours prior to procedure and then one tablet (50 mg) one hour prior to procedure.   3 tablet   0   . traMADol (ULTRAM) 50 MG tablet   Oral   Take 1 tablet (50 mg total) by mouth every 6 (six) hours as needed for moderate pain.   30 tablet   1   . vitamin C (ASCORBIC ACID) 500 MG tablet   Oral   Take 500 mg by mouth daily.         . Zinc Sulfate (ZINC  15 PO)   Oral   Take 1 tablet by mouth daily.          BP 163/49  Pulse 69  Temp(Src)  98.2 F (36.8 C) (Oral)  Resp 19  SpO2 98% Physical Exam 1050: Physical examination:  Nursing notes reviewed; Vital signs and O2 SAT reviewed;  Constitutional: Well developed, Well nourished, Well hydrated, Uncomfortable appearing.; Head:  Normocephalic, atraumatic; Eyes: EOMI, PERRL, No scleral icterus; ENMT: Mouth and pharynx normal, Mucous membranes moist; Neck: Supple, Full range of motion, No lymphadenopathy; Cardiovascular: Regular rate and rhythm, No gallop; Respiratory: Breath sounds clear & equal bilaterally, No wheezes.  Speaking full sentences with ease, Normal respiratory effort/excursion; Chest: Nontender, Movement normal; Abdomen: Soft, Nontender, Nondistended, Normal bowel sounds; Genitourinary: No CVA tenderness; Extremities: Pulses normal, +right forefoot and toes tenderness to palp with localized erythema from all toes extending up dorsal foot, +edema from right toes to ankle. +shallow ulcers with scant purulent drainage to tips of 2nd, 3rd toes as well as lateral 5th MTP area. No bleeding. No calf edema or asymmetry.; Neuro: AA&Ox3, Major CN grossly intact.  Speech clear. No gross focal motor or sensory deficits in extremities.; Skin: Color normal, Warm, Dry.      ED Course  Procedures     EKG Interpretation None      MDM  MDM Reviewed: previous chart, nursing note and vitals Reviewed previous: labs Interpretation: labs and x-ray    Results for orders placed during the hospital encounter of 16/10/96  BASIC METABOLIC PANEL      Result Value Ref Range   Sodium 141  137 - 147 mEq/L   Potassium 4.6  3.7 - 5.3 mEq/L   Chloride 103  96 - 112 mEq/L   CO2 26  19 - 32 mEq/L   Glucose, Bld 94  70 - 99 mg/dL   BUN 20  6 - 23 mg/dL   Creatinine, Ser 0.79  0.50 - 1.10 mg/dL   Calcium 9.8  8.4 - 10.5 mg/dL   GFR calc non Af Amer 73 (*) >90 mL/min   GFR calc Af Amer 84 (*) >90 mL/min  CBC WITH DIFFERENTIAL      Result Value Ref Range   WBC 9.1  4.0 - 10.5 K/uL   RBC  4.19  3.87 - 5.11 MIL/uL   Hemoglobin 11.5 (*) 12.0 - 15.0 g/dL   HCT 35.7 (*) 36.0 - 46.0 %   MCV 85.2  78.0 - 100.0 fL   MCH 27.4  26.0 - 34.0 pg   MCHC 32.2  30.0 - 36.0 g/dL   RDW 15.6 (*) 11.5 - 15.5 %   Platelets 298  150 - 400 K/uL   Neutrophils Relative % 66  43 - 77 %   Neutro Abs 6.0  1.7 - 7.7 K/uL   Lymphocytes Relative 24  12 - 46 %   Lymphs Abs 2.2  0.7 - 4.0 K/uL   Monocytes Relative 8  3 - 12 %   Monocytes Absolute 0.8  0.1 - 1.0 K/uL   Eosinophils Relative 1  0 - 5 %   Eosinophils Absolute 0.1  0.0 - 0.7 K/uL   Basophils Relative 1  0 - 1 %   Basophils Absolute 0.1  0.0 - 0.1 K/uL  LACTIC ACID, PLASMA      Result Value Ref Range   Lactic Acid, Venous 1.1  0.5 - 2.2 mmol/L   Dg Foot Complete Right 05/08/2013   CLINICAL DATA:  Right foot  pain, redness, multiple ulcers  EXAM: RIGHT FOOT COMPLETE - 3+ VIEW  COMPARISON:  None  FINDINGS: Diffuse osseous demineralization.  Minimal scattered narrowing of interphalangeal joints and first MTP joint.  Dressing artifacts lateral to the fifth MTP joint.  No acute fracture, dislocation or bone destruction.  Minimal calcaneal spurring and diffuse right foot soft tissue swelling.  IMPRESSION: No acute osseous abnormalities.   Electronically Signed   By: Lavonia Dana M.D.   On: 05/08/2013 12:04    1250:  Right foot appears cellulitic; will dose IV vancomycin. Pt's pain improved after IV pain meds. Dx and testing d/w pt and family.  Questions answered.  Verb understanding, agreeable to observation admit. T/C to [facility] Wyline Copas, case discussed, including:  HPI, pertinent PM/SHx, VS/PE, dx testing, ED course and treatment:  Agreeable to observation admit, requests to write temporary orders, obtain medical bed to team 8.   Alfonzo Feller, DO 05/09/13 2037

## 2013-05-08 NOTE — Progress Notes (Signed)
Manuela Schwartz RN from ED called back with report at 1425. Pt. Arrived to floor from ED via stretcher with family at bedside. No respiratory distress noted. Continue with admission process.

## 2013-05-08 NOTE — H&P (Signed)
[facility] History and Physical  DRISANA SCHWEICKERT NGE:[id number] DOB: [birth year] DOA: 05/08/2013  Referring physician: Emergency Department PCP: Walker Kehr, MD  Specialists:   Chief Complaint: Foot pain  HPI: Regina Hamilton is a 80 y.o. female  With a hx of PVD, reportedly not a surgical candidate, hx of pacemaker placement who presents with worsening L foot pain. Pt was recently seen by podiatry for repeat R foot and toe debridement. Pt soon noted to have worsening swelling, pain, and redness of foot. Pt presented to Ed, where she was started on empiric vanc and hospitalist consulted for consideration for admission.  Review of Systems: Per above, the remainder of the 10pt ros reviewed and are neg  Past Medical History  Diagnosis Date  . Peripheral vascular disease   . Carotid artery occlusion   . Hypertension   . Hyperlipidemia   . Arthritis   . Pacemaker 2008    [facility] in [place], [place]  . Complication of anesthesia   . Dysrhythmia     atrial fibrilation  . Anxiety   . GERD (gastroesophageal reflux disease)   . Chronic venous insufficiency     B  . Atrioventricular block, complete s/p AV junction ablation 11/19/2012   Past Surgical History  Procedure Laterality Date  . Spine surgery  1970    Cervical fusion  . Cardio ablation- pacemaker    . Pr vein bypass graft,aorto-fem-pop    . Total hip revision      bilateral  . Cholecystectomy  05/20/11  . Angioplasty  03/01/2012  . Insert / replace / remove pacemaker    . Corneal transplant Bilateral    Social History:  reports that she has never smoked. She has never used smokeless tobacco. She reports that she does not drink alcohol or use illicit drugs.  where does patient live--home, ALF, SNF? and with whom if at home?  Can patient participate in ADLs?  Allergies  Allergen Reactions  . Latanoprost Anaphylaxis    redness  . Pentoxifylline     Severe SOB, mental confusion per daughter  . Versed  [Midazolam]     REACTION: hallucinations per daughter  . Warfarin Sodium Rash    Unknown  . Alphagan P [Brimonidine Tartrate] Photosensitivity    Redness bilateral eye  . Oxycodone Hcl Itching  . Avelox [Moxifloxacin Hcl In Nacl] Other (See Comments)    tongue swelling  . Clonidine Derivatives Other (See Comments)    Pt's daughter states she has sever hallucinations  . Contrast Media [Iodinated Diagnostic Agents]     Contrast dye.  . Diltiazem Hcl Hives  . Doxycycline Nausea And Vomiting  . Hydralazine     itchig  . Iron     REACTION: RASH  . Meloxicam     Unknown  . Moxifloxacin     REACTION: Tongue swelling; mouth ulcers; confusion    Family History  Problem Relation Age of Onset  . Heart disease Mother     Heart disease before age 29  . Hypertension Mother   . Varicose Veins Mother   . Peripheral vascular disease Mother   . Other Mother     amputation  . Heart disease Father     Heat Diease before age 95  . Heart disease Maternal Grandmother   . Heart disease Paternal Grandmother   . Diabetes Sister   . Hypertension Sister   . Varicose Veins Sister   . Diabetes Brother   . Hypertension Brother   .  Heart attack Brother   . Hypertension Daughter     (be sure to complete)  Prior to Admission medications   Medication Sig Start Date End Date Taking? Authorizing Provider  aliskiren (TEKTURNA) 300 MG tablet Take 150 mg by mouth daily. 01/17/13  Yes Evie Lacks Plotnikov, MD  ALPRAZolam (XANAX) 0.25 MG tablet Take 1 tablet (0.25 mg total) by mouth at bedtime as needed for sleep. 01/17/13  Yes Evie Lacks Plotnikov, MD  amLODipine (NORVASC) 10 MG tablet Take 1 tablet (10 mg total) by mouth daily. 01/17/13  Yes Evie Lacks Plotnikov, MD  aspirin EC 81 MG tablet Take 81 mg by mouth daily.   Yes Historical Provider, MD  Cholecalciferol (VITAMIN D) 2000 UNITS tablet Take 4,000 Units by mouth daily.   Yes Historical Provider, MD  clopidogrel (PLAVIX) 75 MG tablet Take 75 mg by mouth  daily.   Yes Historical Provider, MD  dorzolamide (TRUSOPT) 2 % ophthalmic solution Place 1 drop into the left eye 2 (two) times daily. 01/17/13  Yes Evie Lacks Plotnikov, MD  gabapentin (NEURONTIN) 100 MG capsule Take 1 capsule (100 mg total) by mouth at bedtime. 01/17/13  Yes Cassandria Anger, MD  HYDROcodone-acetaminophen (NORCO/VICODIN) 5-325 MG per tablet Take 1 tablet by mouth every 6 (six) hours as needed for moderate pain.   Yes Historical Provider, MD  lisinopril (PRINIVIL,ZESTRIL) 20 MG tablet Take 10 mg by mouth 2 (two) times daily. 01/17/13  Yes Evie Lacks Plotnikov, MD  loteprednol (LOTEMAX) 0.5 % ophthalmic suspension Place 1 drop into both eyes daily.   Yes Historical Provider, MD  Multiple Vitamin (MULTIVITAMIN WITH MINERALS) TABS tablet Take 1 tablet by mouth daily.   Yes Historical Provider, MD  Potassium Gluconate 2 MEQ TABS Take 1 each by mouth daily.    Yes Historical Provider, MD  pyridOXINE (VITAMIN B-6) 100 MG tablet Take 100 mg by mouth daily.   Yes Historical Provider, MD  traMADol (ULTRAM) 50 MG tablet Take 1 tablet (50 mg total) by mouth every 6 (six) hours as needed for moderate pain. 04/27/13  Yes Evie Lacks Plotnikov, MD  vitamin C (ASCORBIC ACID) 500 MG tablet Take 500 mg by mouth daily.   Yes Historical Provider, MD  Zinc Sulfate (ZINC 15 PO) Take 1 tablet by mouth daily.   Yes Historical Provider, MD   Physical Exam: Filed Vitals:   05/08/13 1028 05/08/13 1254  BP: 163/49 155/55  Pulse: 69 70  Temp: 98.2 F (36.8 C)   TempSrc: Oral   Resp: 19 20  SpO2: 98% 98%     General:  Awake, in nad  Eyes: PERRL B  ENT: membranes moist, dentition fair  Neck: trachea midline, neck supple  Cardiovascular: regular, s1, s2  Respiratory: normal resp effort, no wheezing  Abdomen: soft, nondistended  Skin: normal skin turgor, erythema, tenderness and no active drainage over R foot, nondraining ulcers over lateral R foot and dips of 3rd and 4th  digits  Musculoskeletal: perfused, no clubbing  Psychiatric: mood/affect normal // no auditory/visual hallucinations  Neurologic: cn2-12 grossly intact, strength/sensation intact  Labs on Admission:  Basic Metabolic Panel:  Recent Labs Lab 05/08/13 1130  NA 141  K 4.6  CL 103  CO2 26  GLUCOSE 94  BUN 20  CREATININE 0.79  CALCIUM 9.8   Liver Function Tests: No results found for this basename: AST, ALT, ALKPHOS, BILITOT, PROT, ALBUMIN,  in the last 168 hours No results found for this basename: LIPASE, AMYLASE,  in the last  168 hours No results found for this basename: AMMONIA,  in the last 168 hours CBC:  Recent Labs Lab 05/08/13 1130  WBC 9.1  NEUTROABS 6.0  HGB 11.5*  HCT 35.7*  MCV 85.2  PLT 298   Cardiac Enzymes: No results found for this basename: CKTOTAL, CKMB, CKMBINDEX, TROPONINI,  in the last 168 hours  BNP (last 3 results)  Recent Labs  09/02/12 1259 09/03/12 0412 09/04/12 0435  PROBNP 2164.0* 1834.0* 1066.0*   CBG: No results found for this basename: GLUCAP,  in the last 168 hours  Radiological Exams on Admission: Dg Foot Complete Right  05/08/2013   CLINICAL DATA:  Right foot pain, redness, multiple ulcers  EXAM: RIGHT FOOT COMPLETE - 3+ VIEW  COMPARISON:  None  FINDINGS: Diffuse osseous demineralization.  Minimal scattered narrowing of interphalangeal joints and first MTP joint.  Dressing artifacts lateral to the fifth MTP joint.  No acute fracture, dislocation or bone destruction.  Minimal calcaneal spurring and diffuse right foot soft tissue swelling.  IMPRESSION: No acute osseous abnormalities.   Electronically Signed   By: Lavonia Dana M.D.   On: 05/08/2013 12:04   Assessment/Plan Active Problems:   HYPERTENSION   Atrial fibrillation   Cellulitis of right foot   Cellulitis   1. Foot cellulitis 1. Cont with empiric vanc 2. Admit to med-surg 3. Will obtain blood cx 4. Consider ct foot w/ contrast 2. HTN 1. Stable 2. Cont  meds 3. Afib 1. Rate controlled 2. Pt is s/p pace maker 4. DVT prophylaxis 1. Heparin   Code Status: Full (must indicate code status--if unknown or must be presumed, indicate so) Family Communication: Pt in room (indicate person spoken with, if applicable, with phone number if by telephone) Disposition Plan: Pending (indicate anticipated LOS)  Time spent: 67min  Zayleigh Stroh, [facility] Pager [phone]  If 7PM-7AM, please contact night-coverage [email or web address] Password [facility] 05/08/2013, 1:48 PM

## 2013-05-09 DIAGNOSIS — I10 Essential (primary) hypertension: Secondary | ICD-10-CM

## 2013-05-09 DIAGNOSIS — G609 Hereditary and idiopathic neuropathy, unspecified: Secondary | ICD-10-CM

## 2013-05-09 DIAGNOSIS — I872 Venous insufficiency (chronic) (peripheral): Secondary | ICD-10-CM

## 2013-05-09 LAB — CBC
HCT: 32.8 % — ABNORMAL LOW (ref 36.0–46.0)
HEMOGLOBIN: 10.5 g/dL — AB (ref 12.0–15.0)
MCH: 27.6 pg (ref 26.0–34.0)
MCHC: 32 g/dL (ref 30.0–36.0)
MCV: 86.3 fL (ref 78.0–100.0)
Platelets: 249 10*3/uL (ref 150–400)
RBC: 3.8 MIL/uL — AB (ref 3.87–5.11)
RDW: 15.6 % — ABNORMAL HIGH (ref 11.5–15.5)
WBC: 7.5 10*3/uL (ref 4.0–10.5)

## 2013-05-09 LAB — COMPREHENSIVE METABOLIC PANEL
ALT: 10 U/L (ref 0–35)
AST: 15 U/L (ref 0–37)
Albumin: 2.8 g/dL — ABNORMAL LOW (ref 3.5–5.2)
Alkaline Phosphatase: 76 U/L (ref 39–117)
BUN: 18 mg/dL (ref 6–23)
CALCIUM: 9.1 mg/dL (ref 8.4–10.5)
CO2: 24 meq/L (ref 19–32)
CREATININE: 0.77 mg/dL (ref 0.50–1.10)
Chloride: 107 mEq/L (ref 96–112)
GFR calc Af Amer: 85 mL/min — ABNORMAL LOW (ref >90)
GFR, EST NON AFRICAN AMERICAN: 73 mL/min — AB (ref >90)
Glucose, Bld: 85 mg/dL (ref 70–99)
Potassium: 3.9 mEq/L (ref 3.7–5.3)
Sodium: 143 mEq/L (ref 137–147)
Total Bilirubin: 0.5 mg/dL (ref 0.3–1.2)
Total Protein: 6 g/dL (ref 6.0–8.3)

## 2013-05-09 MED ORDER — LIDOCAINE 5 % EX PTCH
1.0000 | MEDICATED_PATCH | CUTANEOUS | Status: DC
  Administered 2013-05-09 – 2013-05-10 (×2): 1 via TRANSDERMAL
  Filled 2013-05-09 (×2): qty 1

## 2013-05-09 MED ORDER — BOOST / RESOURCE BREEZE PO LIQD
1.0000 | Freq: Two times a day (BID) | ORAL | Status: DC
  Administered 2013-05-09 (×2): 1 via ORAL

## 2013-05-09 MED ORDER — HYDROCODONE-ACETAMINOPHEN 5-325 MG PO TABS
2.0000 | ORAL_TABLET | Freq: Four times a day (QID) | ORAL | Status: DC | PRN
  Administered 2013-05-09 – 2013-05-10 (×4): 2 via ORAL
  Filled 2013-05-09 (×5): qty 2

## 2013-05-09 MED ORDER — ALISKIREN FUMARATE 150 MG PO TABS
150.0000 mg | ORAL_TABLET | Freq: Every day | ORAL | Status: DC
  Administered 2013-05-09: 150 mg via ORAL
  Filled 2013-05-09: qty 1

## 2013-05-09 MED ORDER — ALPRAZOLAM 0.25 MG PO TABS
0.2500 mg | ORAL_TABLET | Freq: Every day | ORAL | Status: DC
  Administered 2013-05-09: 0.25 mg via ORAL
  Filled 2013-05-09: qty 1

## 2013-05-09 MED ORDER — COLLAGENASE 250 UNIT/GM EX OINT
TOPICAL_OINTMENT | Freq: Every day | CUTANEOUS | Status: DC
  Administered 2013-05-09 – 2013-05-10 (×2): via TOPICAL
  Filled 2013-05-09: qty 30

## 2013-05-09 NOTE — Progress Notes (Signed)
INITIAL NUTRITION ASSESSMENT  DOCUMENTATION CODES Per approved criteria  -Not Applicable   INTERVENTION: -Recommend Resource Breeze po BID, each supplement provides 250 kcal and 9 grams of protein -Will add 8PM nourishment -Encouraged intake of low sodium protein sources/snacks -Will continue to monitor  NUTRITION DIAGNOSIS: Inadequate oral intake related to early satiety as evidenced by PO intake <75%, unintentional wt loss.   Goal: Pt to meet >/= 90% of their estimated nutrition needs    Monitor:  Total protein/energy intake, labs, weights, skin integrity  Reason for Assessment: MST  80 y.o. female  Admitting Dx: <principal problem not specified>  ASSESSMENT: Regina Hamilton is a 80 y.o. female With a hx of PVD, reportedly not a surgical candidate, hx of pacemaker placement who presents with worsening L foot pain. Pt was recently seen by podiatry for repeat R foot and toe debridement. Pt soon noted to have worsening swelling, pain, and redness of foot. Pt presented to Ed, where she was started on empiric vanc and hospitalist consulted for consideration for admission.  -Pt's daughter reported pt usual weight around 120 lbs.  -Was hospitalized several months ago and weight dropped down to 109 lbs -With family encouragement, was able to regain and maintain weight around 115-116 lbs -Daughter noted an overall decreased intake for past 2 weeks, and believe that pt has lost weight.  -Current weight likely affected by RLE edema -Diet recall indicates pt consuming 2-3 meals with protein bars either for snacks or third meal. Breakfast consists of oatmeal or dry cereals, will occasionally stir in peanut butter or add protein bars. Also drinks smoothies occassionally with protein powder added -Does not enjoy Boost or Ensure as they cause her GI distress -Has been encouraging greek yogurt, eggs for protein as pt does not enjoy most animal proteins- meat/chicken/fish -Reported feelings of  early satiety, consider addition of stool softener if pt unable to pass BM by 3/24 -Pt also willing to have crackers and peanut butter as snack. -Daughter does not think diet liberalizing would promote appetite as pt is used to complying with heart healthy diet -Nutrition Focused Physical Exam:  Subcutaneous Fat:  Orbital Region: WDL Upper Arm Region:WDL Thoracic and Lumbar Region: WDL  Muscle:  Temple Region: WDL Clavicle Bone Region: WDL Clavicle and Acromion Bone Region: WDL Scapular Bone Region: WDL Dorsal Hand: moderate Patellar Region: WDL Anterior Thigh Region: WDL Posterior Calf Region: WDL  Edema: +2 RLE edema    Height: Ht Readings from Last 1 Encounters:  05/08/13 5\' 4"  (1.626 m)    Weight: Wt Readings from Last 1 Encounters:  05/08/13 116 lb 6.5 oz (52.8 kg)    Ideal Body Weight: 120 lbs  % Ideal Body Weight: 97%  Wt Readings from Last 10 Encounters:  05/08/13 116 lb 6.5 oz (52.8 kg)  04/28/13 111 lb (50.349 kg)  04/04/13 117 lb (53.071 kg)  04/04/13 117 lb (53.071 kg)  03/30/13 117 lb (53.071 kg)  03/24/13 116 lb 6.4 oz (52.799 kg)  01/17/13 122 lb (55.339 kg)  12/15/12 120 lb (54.432 kg)  11/19/12 116 lb (52.617 kg)  10/13/12 117 lb (53.071 kg)    Usual Body Weight: 116 lbs  % Usual Body Weight: 100%  BMI:  Body mass index is 19.97 kg/(m^2). Normal  Estimated Nutritional Needs: Kcal: 1350-1550 Protein: 50-60 gram Fluid: >/=1500 ml/daily  Skin: cellulitis of right foot  Diet Order: Cardiac  EDUCATION NEEDS: -No education needs identified at this time   Intake/Output Summary (Last 24 hours) at  05/09/13 1044 Last data filed at 05/09/13 0600  Gross per 24 hour  Intake 1608.75 ml  Output    300 ml  Net 1308.75 ml    Last BM: 3/21   Labs:   Recent Labs Lab 05/08/13 1130 05/09/13 0433  NA 141 143  K 4.6 3.9  CL 103 107  CO2 26 24  BUN 20 18  CREATININE 0.79 0.77  CALCIUM 9.8 9.1  GLUCOSE 94 85    CBG (last 3)  No  results found for this basename: GLUCAP,  in the last 72 hours  Scheduled Meds: . aliskiren  150 mg Oral Daily  . ALPRAZolam  0.25 mg Oral QHS  . amLODipine  10 mg Oral Daily  . aspirin EC  81 mg Oral Daily  . clopidogrel  75 mg Oral Daily  . collagenase   Topical Daily  . docusate sodium  100 mg Oral BID  . dorzolamide  1 drop Left Eye BID  . feeding supplement (RESOURCE BREEZE)  1 Container Oral q12n4p  . gabapentin  100 mg Oral QHS  . heparin  5,000 Units Subcutaneous 3 times per day  . lidocaine  1 patch Transdermal Q24H  . lisinopril  10 mg Oral BID  . loteprednol  1 drop Both Eyes Daily  . pneumococcal 23 valent vaccine  0.5 mL Intramuscular Tomorrow-1000  . vancomycin  1,000 mg Intravenous Q24H  . vitamin C  500 mg Oral Daily    Continuous Infusions: . sodium chloride 75 mL/hr at 05/09/13 8938    Past Medical History  Diagnosis Date  . Peripheral vascular disease   . Carotid artery occlusion   . Hypertension   . Hyperlipidemia   . Arthritis   . Pacemaker 2008    [facility] in [place], [place]  . Complication of anesthesia   . Dysrhythmia     atrial fibrilation  . Anxiety   . Chronic venous insufficiency     B  . Atrioventricular block, complete s/p AV junction ablation 11/19/2012  . Neuromuscular disorder     periphreal neuroptahty    Past Surgical History  Procedure Laterality Date  . Spine surgery  1970    Cervical fusion  . Cardio ablation- pacemaker    . Pr vein bypass graft,aorto-fem-pop    . Total hip revision      bilateral  . Cholecystectomy  05/20/11  . Angioplasty  03/01/2012  . Insert / replace / remove pacemaker    . Corneal transplant Bilateral   . Back surgery      Atlee Abide Amorita LDN Clinical Dietitian [phone]

## 2013-05-09 NOTE — Progress Notes (Addendum)
[facility] PROGRESS NOTE  Regina Hamilton BJS:[id number] DOB: [birth year] DOA: 05/08/2013 PCP: Walker Kehr, MD   Brief narrative 80 y.o. female With a hx of PVD, reportedly not a surgical candidate, hx of pacemaker placement who presents with worsening L foot pain. Pt was recently seen by podiatry for repeat R foot and toe debridement. Pt soon noted to have worsening swelling, pain, and redness of foot. Pt presented to ED, where she was started on empiric vancomycin and hospitalist consulted for admission.  Assessment/Plan: Cellulitis of right foot Started on empiric vancomycin which he'll continue CT scan of the foot negative for any abscess or osteomyelitis Continue collagenase dressing. Pain control with Vicodin and dilaudid. Will add lidoderm patch. Patient was not tolerant to increased dose of Neurontin as outpatient. Also has not tolerated increased dose of tramadol. -Followup with podiatry as outpatient -Patient also sees her vascular surgeon every 6 months. -Will order PT/ OT  Hypertension Blood pressure stable Continue tekturrna, amlodipine and lisinopril    Peripheral vascular disease Continue aspirin and Plavix.   Malnutrition Continue nutrition supplements  Atrial fibrillation Current in sinus. Status post pacemaker  Diet: Cardiac DVT prophylaxis: Cutaneous hepatin  Code Status: Full code Family Communication: Daughter at bedside Disposition Plan: Pending PT eval   Consultants:  None  Procedures:  None  Antibiotics:  IV vancomycin  HPI/Subjective: Continued and examined this morning.  reports pain over her right foot.  Objective: Filed Vitals:   05/09/13 0424  BP: 134/61  Pulse: 69  Temp: 98.5 F (36.9 C)  Resp: 18    Intake/Output Summary (Last 24 hours) at 05/09/13 1430 Last data filed at 05/09/13 0900  Gross per 24 hour  Intake 1848.75 ml  Output    600 ml  Net 1248.75 ml   Filed Weights   05/08/13 1445  Weight: 52.8 kg  (116 lb 6.5 oz)    Exam:   General:  Elderly female lying in bed in no acute distress  HEENT: No pallor, moist oral mucosa  Chest: Clear to auscultation bilaterally, no added sounds  CVS: Normal S1-S2, no murmurs rub or gallop  Abdomen: Soft, nontender, nondistended, bowel sounds present  Extremities: Pitting edema over her right foot with ulceration measuring 2X2 centimeter over the planter surface of distal lateral right foot with superficial blisters over the toes. Absent dorsalis pedis  CNS: AAO x3      Data Reviewed: Basic Metabolic Panel:  Recent Labs Lab 05/08/13 1130 05/09/13 0433  NA 141 143  K 4.6 3.9  CL 103 107  CO2 26 24  GLUCOSE 94 85  BUN 20 18  CREATININE 0.79 0.77  CALCIUM 9.8 9.1   Liver Function Tests:  Recent Labs Lab 05/09/13 0433  AST 15  ALT 10  ALKPHOS 76  BILITOT 0.5  PROT 6.0  ALBUMIN 2.8*   No results found for this basename: LIPASE, AMYLASE,  in the last 168 hours No results found for this basename: AMMONIA,  in the last 168 hours CBC:  Recent Labs Lab 05/08/13 1130 05/09/13 0433  WBC 9.1 7.5  NEUTROABS 6.0  --   HGB 11.5* 10.5*  HCT 35.7* 32.8*  MCV 85.2 86.3  PLT 298 249   Cardiac Enzymes: No results found for this basename: CKTOTAL, CKMB, CKMBINDEX, TROPONINI,  in the last 168 hours BNP (last 3 results)  Recent Labs  09/02/12 1259 09/03/12 0412 09/04/12 0435  PROBNP 2164.0* 1834.0* 1066.0*   CBG: No results found for this basename: GLUCAP,  in the last 168 hours  No results found for this or any previous visit (from the past 240 hour(s)).   Studies: Ct Foot Right Wo Contrast  05/08/2013   CLINICAL DATA:  Worsening swelling, pain, and redness of foot.  EXAM: CT OF THE RIGHT FOOT WITHOUT CONTRAST  TECHNIQUE: Multidetector CT imaging was performed according to the standard protocol. Multiplanar CT image reconstructions were also generated.  COMPARISON:  None.  FINDINGS: There is soft tissue swelling along  the lateral aspect of the right foot at the level of the fifth metatarsal head. The soft tissue swelling extends to the level of the cortex. There is no cortical destruction. There is no bone resort shin or periosteal reaction. There is no subcutaneous emphysema. The joint spaces are relatively well maintained. There is no acute fracture or dislocation. There is no focal fluid collection. There is generalized edema involving the right foot.  The extensor, flexor and peroneal tendons are grossly intact. The Achilles tendon is intact.  IMPRESSION: Soft tissue swelling along the lateral aspect of the right foot at the level of the fifth metatarsal head without adjacent bone destruction or periosteal reaction. There are no CT findings to suggest osteomyelitis. If there is further clinical concern recommend MRI without and with intravenous contrast.   Electronically Signed   By: Kathreen Devoid   On: 05/08/2013 19:51   Dg Foot Complete Right  05/08/2013   CLINICAL DATA:  Right foot pain, redness, multiple ulcers  EXAM: RIGHT FOOT COMPLETE - 3+ VIEW  COMPARISON:  None  FINDINGS: Diffuse osseous demineralization.  Minimal scattered narrowing of interphalangeal joints and first MTP joint.  Dressing artifacts lateral to the fifth MTP joint.  No acute fracture, dislocation or bone destruction.  Minimal calcaneal spurring and diffuse right foot soft tissue swelling.  IMPRESSION: No acute osseous abnormalities.   Electronically Signed   By: Lavonia Dana M.D.   On: 05/08/2013 12:04    Scheduled Meds: . aliskiren  150 mg Oral Daily  . ALPRAZolam  0.25 mg Oral QHS  . amLODipine  10 mg Oral Daily  . aspirin EC  81 mg Oral Daily  . clopidogrel  75 mg Oral Daily  . collagenase   Topical Daily  . docusate sodium  100 mg Oral BID  . dorzolamide  1 drop Left Eye BID  . feeding supplement (RESOURCE BREEZE)  1 Container Oral q12n4p  . gabapentin  100 mg Oral QHS  . heparin  5,000 Units Subcutaneous 3 times per day  .  lidocaine  1 patch Transdermal Q24H  . lisinopril  10 mg Oral BID  . loteprednol  1 drop Both Eyes Daily  . pneumococcal 23 valent vaccine  0.5 mL Intramuscular Tomorrow-1000  . vancomycin  1,000 mg Intravenous Q24H  . vitamin C  500 mg Oral Daily   Continuous Infusions: . sodium chloride 75 mL/hr at 05/09/13 0316      Time spent: 25 minutes    Regina Hamilton, [place]  [facility] Pager [phone]. If 7PM-7AM, please contact night-coverage at [email or web address], password [facility] 05/09/2013, 2:30 PM  LOS: 1 day

## 2013-05-09 NOTE — Evaluation (Signed)
Physical Therapy Evaluation Patient Details Name: Regina Hamilton MRN: [id number] DOB: [birth year] Today's Date: 05/09/2013 Time: 8366-2947 PT Time Calculation (min): 20 min  PT Assessment / Plan / Recommendation History of Present Illness  Admitted for pain control after debridement of R foot wound at [facility]. Has h/o PVD.  Clinical Impression  Pt  Tolerated standing, difficulty with attempts to remain NWB on R. Per daughter, pt  Was ambulating without difficulty PTA, negotiating flight of stairs prior to debridement  On Friday and increased pain. Pt will benefit from PT to address problems listed.    PT Assessment  Patient needs continued PT services    Follow Up Recommendations  Home health PT    Does the patient have the potential to tolerate intense rehabilitation      Barriers to Discharge        Equipment Recommendations  None recommended by PT    Recommendations for Other Services     Frequency Min 3X/week    Precautions / Restrictions Precautions Precautions: Fall Precaution Comments: R foot wound Restrictions Weight Bearing Restrictions: Yes Other Position/Activity Restrictions: Pt cannot tolerate full weight. Per pt , after  visit to wound ctr, pt was fukll weight and able to negotiate steps   Pertinent Vitals/Pain Pt reports pain as 4.5 after standing up. RN in w/ medication      Mobility  Bed Mobility Overal bed mobility: Modified Independent Transfers Overall transfer level: Needs assistance Equipment used: Rolling walker (2 wheeled) Transfers: Sit to/from Stand Sit to Stand: Mod assist General transfer comment: lifting assistance to stand from Low surface with mostly weight on L leg. Ambulation/Gait Ambulation/Gait assistance: Min assist Ambulation Distance (Feet): 3 Feet Assistive device: Rolling walker (2 wheeled) Gait Pattern/deviations: Step-to pattern;Decreased stance time - right General Gait Details: cues to minimize weight on R foot.     Exercises     PT Diagnosis: Difficulty walking;Acute pain  PT Problem List: Decreased strength;Decreased activity tolerance;Decreased mobility;Pain;Decreased knowledge of use of DME;Decreased safety awareness;Decreased knowledge of precautions PT Treatment Interventions: DME instruction;Gait training;Functional mobility training;Therapeutic activities;Therapeutic exercise;Patient/family education     PT Goals(Current goals can be found in the care plan section) Acute Rehab PT Goals Patient Stated Goal: I want to mow my grass again PT Goal Formulation: With patient/family Time For Goal Achievement: 05/23/13 Potential to Achieve Goals: Good  Visit Information  Last PT Received On: 05/09/13 Assistance Needed: +1 History of Present Illness: Admitted for pain control after debridement of R foot wound at [facility]. Has h/o PVD.       Prior Functioning  Home Living Family/patient expects to be discharged to:: Private residence Living Arrangements: Alone;Children Available Help at Discharge: Family;Available 24 hours/day Type of Home: House Home Access: Ramped entrance Home Layout: Two level;Able to live on main level with bedroom/bathroom Alternate Level Stairs-Number of Steps: flight Home Equipment: Walker - 4 wheels;Walker - 2 wheels;Bedside commode Prior Function Level of Independence: Independent Communication Communication: Expressive difficulties    Cognition  Cognition Arousal/Alertness: Awake/alert Behavior During Therapy: WFL for tasks assessed/performed Overall Cognitive Status: Within Functional Limits for tasks assessed    Extremity/Trunk Assessment Upper Extremity Assessment Upper Extremity Assessment: Overall WFL for tasks assessed Lower Extremity Assessment Lower Extremity Assessment: RLE deficits/detail RLE Deficits / Details: able to bear partial weight, mostly on heel.    Balance    End of Session PT - End of Session Activity Tolerance: Patient limited  by pain Patient left: in bed;with call bell/phone within reach;with family/visitor  present Nurse Communication: Mobility status  GP     Claretha Cooper 05/09/2013, 3:16 PM Tresa Endo PT [phone]

## 2013-05-09 NOTE — Progress Notes (Signed)
UR completed. Patient changed to inpatient- requiring IV antibiotics and IVF @ 75cc/hr  

## 2013-05-10 DIAGNOSIS — I739 Peripheral vascular disease, unspecified: Secondary | ICD-10-CM

## 2013-05-10 DIAGNOSIS — L98499 Non-pressure chronic ulcer of skin of other sites with unspecified severity: Secondary | ICD-10-CM

## 2013-05-10 DIAGNOSIS — E46 Unspecified protein-calorie malnutrition: Secondary | ICD-10-CM

## 2013-05-10 MED ORDER — DSS 100 MG PO CAPS: 100.0000 mg | ORAL_CAPSULE | Freq: Two times a day (BID) | ORAL | Status: DC | PRN

## 2013-05-10 MED ORDER — HYDROCODONE-ACETAMINOPHEN 5-325 MG PO TABS: 2.0000 | ORAL_TABLET | Freq: Four times a day (QID) | ORAL | Status: DC | PRN

## 2013-05-10 MED ORDER — BISACODYL 10 MG RE SUPP: 10.0000 mg | Freq: Once | RECTAL | Status: DC

## 2013-05-10 MED ORDER — COLLAGENASE 250 UNIT/GM EX OINT: TOPICAL_OINTMENT | Freq: Every day | CUTANEOUS | Status: DC

## 2013-05-10 MED ORDER — FENTANYL 12 MCG/HR TD PT72: 12.5000 ug | MEDICATED_PATCH | TRANSDERMAL | Status: DC

## 2013-05-10 MED ORDER — LIDOCAINE 5 % EX PTCH: 1.0000 | MEDICATED_PATCH | CUTANEOUS | Status: DC

## 2013-05-10 MED ORDER — SULFAMETHOXAZOLE-TRIMETHOPRIM 800-160 MG PO TABS: 1.0000 | ORAL_TABLET | Freq: Two times a day (BID) | ORAL | Status: DC

## 2013-05-10 MED ORDER — BOOST / RESOURCE BREEZE PO LIQD: 1.0000 | Freq: Two times a day (BID) | ORAL | Status: DC

## 2013-05-10 NOTE — Discharge Instructions (Addendum)
Peripheral Vascular Disease  Peripheral vascular disease (PVD) is caused by cholesterol buildup in the arteries. The arteries become narrow or clogged. This makes it hard for blood to flow. It happens most in the legs, but it can occur in other areas of your body. HOME CARE   Quit smoking, if you smoke.  Exercise as told by your doctor.  Follow a low-fat, low-cholesterol diet as told by your doctor.  Control your diabetes, if you have diabetes.  Care for your feet to prevent infection.  Only take medicine as told by your doctor. GET HELP RIGHT AWAY IF:   You have pain or lose feeling (numbness) in your arms or legs.  Your arms or legs turn cold or blue.  You have redness, warmth, and puffiness (swelling) in your arms or legs. MAKE SURE YOU:   Understand these instructions.  Will watch your condition.  Will get help right away if you are not doing well or get worse. Document Released: 04/30/2009 Document Revised: 04/28/2011 Document Reviewed: 04/30/2009 [facility] Patient Information 2014 [place], [place].    Cellulitis Cellulitis is an infection of the skin and the tissue under the skin. The infected area is usually red and tender. This happens most often in the arms and lower legs. HOME CARE   Take your antibiotic medicine as told. Finish the medicine even if you start to feel better.  Keep the infected arm or leg raised (elevated).  Put a warm cloth on the area up to 4 times per day.  Only take medicines as told by your doctor.  Keep all doctor visits as told. GET HELP RIGHT AWAY IF:   You have a fever.  You feel very sleepy.  You throw up (vomit) or have watery poop (diarrhea).  You feel sick and have muscle aches and pains.  You see red streaks on the skin coming from the infected area.  Your red area gets bigger or turns a dark color.  Your bone or joint under the infected area is painful after the skin heals.  Your infection comes back in the same  area or different area.  You have a puffy (swollen) bump in the infected area.  You have new symptoms. MAKE SURE YOU:   Understand these instructions.  Will watch your condition.  Will get help right away if you are not doing well or get worse. Document Released: 07/23/2007 Document Revised: 08/05/2011 Document Reviewed: 04/21/2011 [facility] Patient Information 2014 [place], [place].

## 2013-05-10 NOTE — Discharge Summary (Signed)
Physician Discharge Summary  Regina Hamilton WJX:[id number] DOB: Jun 22, 1923 DOA: 05/08/2013  PCP: Walker Kehr, MD  Admit date: 05/08/2013 Discharge date: 05/10/2013  Time spent: 40 minutes  Recommendations for Outpatient Follow-up:  1. Home with home health PT 2. Follow up with PCP and wound care Center in one week.  Discharge Diagnoses:   Principal Problem:   Cellulitis of right foot  Active Problems:   UNSPECIFIED PERIPHERAL VASCULAR DISEASE   Peripheral neuropathy   HYPERTENSION   Atrial fibrillation   Chronic venous insufficiency   Chronic diastolic heart failure   Unspecified protein-calorie malnutrition   Discharge Condition: Fair  Diet recommendation: cardiac  Filed Weights   05/08/13 1445  Weight: 52.8 kg (116 lb 6.5 oz)    History of present illness:  Please refer to admission H&P for details, but in brief, 80 y.o. female With a hx of PVD, reportedly not a surgical candidate, hx of pacemaker placement who presents with worsening L foot pain. Pt was recently seen by podiatry for repeat R foot and toe debridement. Pt soon noted to have worsening swelling, pain, and redness of foot. Pt presented to ED, where she was started on empiric vancomycin and hospitalist consulted for admission.   Hospital Course:  Cellulitis of right foot  Started on empiric vancomycin . Erythema and pain improving. Leukocytosis and remains afebrile. She does have some swelling in her right foot which will be followed up as outpatient. I will discharge her on oral Bactrim for a total 10 day course of antibiotics. CT scan of the foot negative for any abscess or osteomyelitis  Continue collagenase dressing. Pain control with Vicodin and dilaudid. I will increase her Vicodin dose to 2 tablets every 6 hours as needed. Will add lidoderm patch and low dose fentanyl patch. Patient was not tolerant to increased dose of Neurontin as outpatient. Also has not tolerated increased dose of tramadol.   -Followup with wound care and podiatry as outpatient . Continue daily collagenase ointment. -Patient also sees her vascular surgeon every 6 months.    Hypertension  Blood pressure stable  Continue tekturrna, amlodipine and lisinopril   Peripheral vascular disease  Continue aspirin and Plavix.   Malnutrition  Continue nutrition supplements upon discharge  Atrial fibrillation  Current in sinus. Status post pacemaker for heart block  Patient is clinically stable for discharge home. Seen by physical therapy and recommended home health PT. Patient uses a rolling walker at home. She will followup in the wound care center at the end of this week and with PCP in 1-2 weeks. She will be discharged on oral Bactrim until 4/1.  Diet: Cardiac    Code Status: Full code  Family Communication: Daughter at bedside  Disposition Plan: Home with home health PT  Consultants:  None Procedures:  None     Discharge Exam: Filed Vitals:   05/10/13 0603  BP: 138/48  Pulse: 70  Temp: 98.1 F (36.7 C)  Resp: 18    General: Elderly female lying in bed in no acute distress  HEENT: No pallor, moist oral mucosa  Chest: Clear to auscultation bilaterally, no added sounds  CVS: Normal S1-S2, no murmurs rub or gallop  Abdomen: Soft, nontender, nondistended, bowel sounds present  Extremities: Pitting edema over her right foot with ulceration measuring 2X2 centimeter over the planter surface of distal lateral right foot with superficial blisters over the toes.  Absent dorsalis pedis  CNS: AAO x3   Discharge Instructions   Future Appointments Provider Department Dept  Phone   05/17/2013 10:00 AM Collene Gobble, MD [facility] [phone]   05/18/2013 10:15 AM Cassandria Anger, MD [facility] [phone]   06/22/2013 10:00 AM [facility] [place] [facility] [address] [phone]   06/22/2013 11:15 AM Angelia Mould, MD [facility] [phone]   12/21/2013 10:30 AM [facility] [place] [facility] [address] [phone]   12/21/2013 11:30 AM Angelia Mould, MD [facility] [phone]       Medication List    TAKE these medications       aliskiren 300 MG tablet  Commonly known as:  TEKTURNA  Take 150 mg by mouth daily.     ALPRAZolam 0.25 MG tablet  Commonly known as:  XANAX  Take 1 tablet (0.25 mg total) by mouth at bedtime as needed for sleep.     amLODipine 10 MG tablet  Commonly known as:  NORVASC  Take 1 tablet (10 mg total) by mouth daily.     aspirin EC 81 MG tablet  Take 81 mg by mouth daily.     clopidogrel 75 MG tablet  Commonly known as:  PLAVIX  Take 75 mg by mouth daily.     collagenase ointment  Commonly known as:  SANTYL  Apply topically daily. Right foot     dorzolamide 2 % ophthalmic solution  Commonly known as:  TRUSOPT  Place 1 drop into the left eye 2 (two) times daily.     DSS 100 MG Caps  Take 100 mg by mouth 2 (two) times daily as needed for mild constipation.     feeding supplement (RESOURCE BREEZE) Liqd  Take 1 Container by mouth 2 times daily at 12 noon and 4 pm.     fentaNYL 12 MCG/HR  Commonly known as:  DURAGESIC  Place 1 patch (12.5 mcg total) onto the skin every 3 (three) days.     gabapentin 100 MG capsule  Commonly known as:  NEURONTIN  Take 1 capsule (100 mg total) by mouth at bedtime.     HYDROcodone-acetaminophen 5-325 MG per tablet  Commonly known as:  NORCO/VICODIN  Take 2 tablets by mouth every 6 (six) hours as needed for moderate pain.     lidocaine 5 %  Commonly known as:  LIDODERM  Place 1 patch onto the skin daily. Remove & Discard patch within 12 hours or as directed by MD     lisinopril 20 MG tablet  Commonly known as:  PRINIVIL,ZESTRIL  Take 10 mg by mouth 2 (two) times daily.     loteprednol 0.5 % ophthalmic suspension  Commonly known as:  LOTEMAX  Place 1 drop  into both eyes daily.     multivitamin with minerals Tabs tablet  Take 1 tablet by mouth daily.     Potassium Gluconate 2 MEQ Tabs  Take 1 each by mouth daily.     sulfamethoxazole-trimethoprim 800-160 MG per tablet  Commonly known as:  SEPTRA DS  Take 1 tablet by mouth 2 (two) times daily. Until 4/1     traMADol 50 MG tablet  Commonly known as:  ULTRAM  Take 1 tablet (50 mg total) by mouth every 6 (six) hours as needed for moderate pain.     vitamin C 500 MG tablet  Commonly known as:  ASCORBIC ACID  Take 500 mg by mouth daily.     Vitamin D 2000 UNITS tablet  Take 4,000 Units by mouth  daily.     ZINC 15 PO  Take 1 tablet by mouth daily.      ASK your doctor about these medications       pyridOXINE 100 MG tablet  Commonly known as:  VITAMIN B-6  Take 100 mg by mouth daily.       Allergies  Allergen Reactions  . Latanoprost Anaphylaxis    redness  . Pentoxifylline     Severe SOB, mental confusion per daughter  . Versed [Midazolam]     REACTION: hallucinations per daughter  . Warfarin Sodium Rash    Unknown  . Alphagan P [Brimonidine Tartrate] Photosensitivity    Redness bilateral eye  . Oxycodone Hcl Itching  . Avelox [Moxifloxacin Hcl In Nacl] Other (See Comments)    tongue swelling  . Clonidine Derivatives Other (See Comments)    Pt's daughter states she has sever hallucinations  . Contrast Media [Iodinated Diagnostic Agents]     Contrast dye.  . Diltiazem Hcl Hives  . Doxycycline Nausea And Vomiting  . Hydralazine     itchig  . Iron     REACTION: RASH  . Meloxicam     Unknown  . Moxifloxacin     REACTION: Tongue swelling; mouth ulcers; confusion       Follow-up Information   Follow up with Walker Kehr, MD In 1 week.   Specialty:  Internal Medicine   Contact information:   [place] [address] [phone]       Follow up with [facility] On 05/13/2013.   Specialty:   Wound Care   Contact information:   [place] [address] [phone]       The results of significant diagnostics from this hospitalization (including imaging, microbiology, ancillary and laboratory) are listed below for reference.    Significant Diagnostic Studies: Dg Chest 2 View  04/28/2013   CLINICAL DATA:  Cough and shortness of Breath  EXAM: CHEST  2 VIEW  COMPARISON:  09/17/2002  FINDINGS: A pacing device is again seen. The cardiac shadow is stable. The lungs are hyperinflated without focal infiltrate or sizable effusion. Changes of prior vertebral augmentation are seen.  IMPRESSION: COPD without acute abnormality.  No change from the prior exam.   Electronically Signed   By: Inez Catalina M.D.   On: 04/28/2013 15:19   Ct Foot Right Wo Contrast  05/08/2013   CLINICAL DATA:  Worsening swelling, pain, and redness of foot.  EXAM: CT OF THE RIGHT FOOT WITHOUT CONTRAST  TECHNIQUE: Multidetector CT imaging was performed according to the standard protocol. Multiplanar CT image reconstructions were also generated.  COMPARISON:  None.  FINDINGS: There is soft tissue swelling along the lateral aspect of the right foot at the level of the fifth metatarsal head. The soft tissue swelling extends to the level of the cortex. There is no cortical destruction. There is no bone resort shin or periosteal reaction. There is no subcutaneous emphysema. The joint spaces are relatively well maintained. There is no acute fracture or dislocation. There is no focal fluid collection. There is generalized edema involving the right foot.  The extensor, flexor and peroneal tendons are grossly intact. The Achilles tendon is intact.  IMPRESSION: Soft tissue swelling along the lateral aspect of the right foot at the level of the fifth metatarsal head without adjacent bone destruction or periosteal reaction. There are no CT findings to suggest osteomyelitis. If  there is further clinical concern  recommend MRI without and with intravenous contrast.   Electronically Signed   By: Kathreen Devoid   On: 05/08/2013 19:51   Dg Foot Complete Right  05/08/2013   CLINICAL DATA:  Right foot pain, redness, multiple ulcers  EXAM: RIGHT FOOT COMPLETE - 3+ VIEW  COMPARISON:  None  FINDINGS: Diffuse osseous demineralization.  Minimal scattered narrowing of interphalangeal joints and first MTP joint.  Dressing artifacts lateral to the fifth MTP joint.  No acute fracture, dislocation or bone destruction.  Minimal calcaneal spurring and diffuse right foot soft tissue swelling.  IMPRESSION: No acute osseous abnormalities.   Electronically Signed   By: Lavonia Dana M.D.   On: 05/08/2013 12:04    Microbiology: No results found for this or any previous visit (from the past 240 hour(s)).   Labs: Basic Metabolic Panel:  Recent Labs Lab 05/08/13 1130 05/09/13 0433  NA 141 143  K 4.6 3.9  CL 103 107  CO2 26 24  GLUCOSE 94 85  BUN 20 18  CREATININE 0.79 0.77  CALCIUM 9.8 9.1   Liver Function Tests:  Recent Labs Lab 05/09/13 0433  AST 15  ALT 10  ALKPHOS 76  BILITOT 0.5  PROT 6.0  ALBUMIN 2.8*   No results found for this basename: LIPASE, AMYLASE,  in the last 168 hours No results found for this basename: AMMONIA,  in the last 168 hours CBC:  Recent Labs Lab 05/08/13 1130 05/09/13 0433  WBC 9.1 7.5  NEUTROABS 6.0  --   HGB 11.5* 10.5*  HCT 35.7* 32.8*  MCV 85.2 86.3  PLT 298 249   Cardiac Enzymes: No results found for this basename: CKTOTAL, CKMB, CKMBINDEX, TROPONINI,  in the last 168 hours BNP: BNP (last 3 results)  Recent Labs  09/02/12 1259 09/03/12 0412 09/04/12 0435  PROBNP 2164.0* 1834.0* 1066.0*   CBG: No results found for this basename: GLUCAP,  in the last 168 hours     Signed:  Neeti Knudtson  [facility] 05/10/2013, 11:13 AM

## 2013-05-10 NOTE — Care Management Note (Signed)
Cm spoke with patient at bedside with daughter present concerning discharge planning. Md order for HHPT/OT. Pt offered choice, per pt choice [facility] to provide [facility] services. Pt to discharge to daughter's house at [address], [place] [address], [phone]. Per pt's daughter pt has RW, 3N1, cane, and shower chair for dme use. Patient followed at [facility] for lower ext wound. No other needs assessed at this time.

## 2013-05-15 ENCOUNTER — Emergency Department (HOSPITAL_COMMUNITY)
Admission: EM | Admit: 2013-05-15 | Discharge: 2013-05-15 | Disposition: A | Discharge status: Home / Self Care | Payer: Medicare Other | Attending: Emergency Medicine | Admitting: Emergency Medicine

## 2013-05-15 ENCOUNTER — Emergency Department (HOSPITAL_COMMUNITY): Payer: Medicare Other

## 2013-05-15 ENCOUNTER — Encounter (HOSPITAL_COMMUNITY): Payer: Self-pay | Admitting: Emergency Medicine

## 2013-05-15 DIAGNOSIS — Z8669 Personal history of other diseases of the nervous system and sense organs: Secondary | ICD-10-CM | POA: Insufficient documentation

## 2013-05-15 DIAGNOSIS — Z7982 Long term (current) use of aspirin: Secondary | ICD-10-CM | POA: Insufficient documentation

## 2013-05-15 DIAGNOSIS — Z7902 Long term (current) use of antithrombotics/antiplatelets: Secondary | ICD-10-CM | POA: Insufficient documentation

## 2013-05-15 DIAGNOSIS — I10 Essential (primary) hypertension: Secondary | ICD-10-CM | POA: Insufficient documentation

## 2013-05-15 DIAGNOSIS — M129 Arthropathy, unspecified: Secondary | ICD-10-CM | POA: Insufficient documentation

## 2013-05-15 DIAGNOSIS — F411 Generalized anxiety disorder: Secondary | ICD-10-CM | POA: Insufficient documentation

## 2013-05-15 DIAGNOSIS — Z79899 Other long term (current) drug therapy: Secondary | ICD-10-CM | POA: Insufficient documentation

## 2013-05-15 DIAGNOSIS — M79673 Pain in unspecified foot: Secondary | ICD-10-CM

## 2013-05-15 DIAGNOSIS — L0390 Cellulitis, unspecified: Secondary | ICD-10-CM

## 2013-05-15 DIAGNOSIS — L02619 Cutaneous abscess of unspecified foot: Secondary | ICD-10-CM | POA: Insufficient documentation

## 2013-05-15 DIAGNOSIS — L97509 Non-pressure chronic ulcer of other part of unspecified foot with unspecified severity: Secondary | ICD-10-CM | POA: Insufficient documentation

## 2013-05-15 DIAGNOSIS — Z792 Long term (current) use of antibiotics: Secondary | ICD-10-CM | POA: Insufficient documentation

## 2013-05-15 DIAGNOSIS — Z862 Personal history of diseases of the blood and blood-forming organs and certain disorders involving the immune mechanism: Secondary | ICD-10-CM | POA: Insufficient documentation

## 2013-05-15 DIAGNOSIS — L03119 Cellulitis of unspecified part of limb: Principal | ICD-10-CM

## 2013-05-15 DIAGNOSIS — Z950 Presence of cardiac pacemaker: Secondary | ICD-10-CM | POA: Insufficient documentation

## 2013-05-15 DIAGNOSIS — Z8639 Personal history of other endocrine, nutritional and metabolic disease: Secondary | ICD-10-CM | POA: Insufficient documentation

## 2013-05-15 LAB — CBC WITH DIFFERENTIAL/PLATELET
BASOS PCT: 0 % (ref 0–1)
Basophils Absolute: 0 10*3/uL (ref 0.0–0.1)
EOS ABS: 0.1 10*3/uL (ref 0.0–0.7)
EOS PCT: 2 % (ref 0–5)
HCT: 30.8 % — ABNORMAL LOW (ref 36.0–46.0)
Hemoglobin: 10.5 g/dL — ABNORMAL LOW (ref 12.0–15.0)
LYMPHS ABS: 2.5 10*3/uL (ref 0.7–4.0)
Lymphocytes Relative: 35 % (ref 12–46)
MCH: 28.5 pg (ref 26.0–34.0)
MCHC: 34.1 g/dL (ref 30.0–36.0)
MCV: 83.5 fL (ref 78.0–100.0)
MONOS PCT: 11 % (ref 3–12)
Monocytes Absolute: 0.8 10*3/uL (ref 0.1–1.0)
NEUTROS PCT: 52 % (ref 43–77)
Neutro Abs: 3.8 10*3/uL (ref 1.7–7.7)
Platelets: 204 10*3/uL (ref 150–400)
RBC: 3.69 MIL/uL — ABNORMAL LOW (ref 3.87–5.11)
RDW: 15.9 % — AB (ref 11.5–15.5)
WBC: 7.3 10*3/uL (ref 4.0–10.5)

## 2013-05-15 LAB — BASIC METABOLIC PANEL
BUN: 20 mg/dL (ref 6–23)
CO2: 22 mEq/L (ref 19–32)
Calcium: 9.4 mg/dL (ref 8.4–10.5)
Chloride: 101 mEq/L (ref 96–112)
Creatinine, Ser: 1.04 mg/dL (ref 0.50–1.10)
GFR calc Af Amer: 54 mL/min — ABNORMAL LOW (ref >90)
GFR, EST NON AFRICAN AMERICAN: 47 mL/min — AB (ref >90)
Glucose, Bld: 95 mg/dL (ref 70–99)
POTASSIUM: 4.7 meq/L (ref 3.7–5.3)
Sodium: 137 mEq/L (ref 137–147)

## 2013-05-15 MED ORDER — HYDROMORPHONE HCL PF 1 MG/ML IJ SOLN
1.0000 mg | Freq: Once | INTRAMUSCULAR | Status: AC
Start: 2013-05-15 — End: 2013-05-15
  Administered 2013-05-15: 1 mg via INTRAVENOUS
  Filled 2013-05-15: qty 1

## 2013-05-15 MED ORDER — HYDROMORPHONE HCL 4 MG PO TABS: 4.0000 mg | ORAL_TABLET | ORAL | Status: DC | PRN

## 2013-05-15 MED ORDER — HYDROMORPHONE HCL PF 1 MG/ML IJ SOLN
1.0000 mg | Freq: Once | INTRAMUSCULAR | Status: AC
  Administered 2013-05-15: 1 mg via INTRAVENOUS
  Filled 2013-05-15: qty 1

## 2013-05-15 MED ORDER — SODIUM CHLORIDE 0.9 % IV BOLUS (SEPSIS)
500.0000 mL | Freq: Once | INTRAVENOUS | Status: AC
  Administered 2013-05-15: 500 mL via INTRAVENOUS

## 2013-05-15 MED ORDER — HYDROMORPHONE HCL PF 1 MG/ML IJ SOLN: 2.0000 mg | Freq: Once | INTRAMUSCULAR | Status: DC

## 2013-05-15 NOTE — ED Provider Notes (Signed)
CSN: [id number]     Arrival date & time 05/15/13  1816 History   First MD Initiated Contact with Patient 05/15/13 1821     Chief Complaint  Patient presents with  . Foot Pain     (Consider location/radiation/quality/duration/timing/severity/associated sxs/prior Treatment) HPI Comments: Patient is an 80 year old female who presents with severe pain in the right foot that has been going for approximately 1 week. She has an ulcer to the lateral aspect of the foot and the second and third toes. This has been treated with antibiotics however she continues to have severe pain. She was recently admitted at [facility] long for similar complaints. She presents today stating that her pain medication is ineffective and her daughter states that she is suffering at home. She has a fentanyl patch and has been taking hydrocodone and tramadol.  Patient is a 80 y.o. female presenting with lower extremity pain. The history is provided by the patient.  Foot Pain This is a new problem. The current episode started more than 1 week ago. The problem occurs constantly. The problem has been gradually worsening. Nothing aggravates the symptoms. Nothing relieves the symptoms. She has tried nothing for the symptoms. The treatment provided no relief.    Past Medical History  Diagnosis Date  . Peripheral vascular disease   . Carotid artery occlusion   . Hypertension   . Hyperlipidemia   . Arthritis   . Pacemaker 2008    [facility] in [place], [place]  . Complication of anesthesia   . Dysrhythmia     atrial fibrilation  . Anxiety   . Chronic venous insufficiency     B  . Atrioventricular block, complete s/p AV junction ablation 11/19/2012  . Neuromuscular disorder     periphreal neuroptahty   Past Surgical History  Procedure Laterality Date  . Spine surgery  1970    Cervical fusion  . Cardio ablation- pacemaker    . Pr vein bypass graft,aorto-fem-pop    . Total hip revision      bilateral  .  Cholecystectomy  05/20/11  . Angioplasty  03/01/2012  . Insert / replace / remove pacemaker    . Corneal transplant Bilateral   . Back surgery     Family History  Problem Relation Age of Onset  . Heart disease Mother     Heart disease before age 34  . Hypertension Mother   . Varicose Veins Mother   . Peripheral vascular disease Mother   . Other Mother     amputation  . Heart disease Father     Heat Diease before age 35  . Heart disease Maternal Grandmother   . Heart disease Paternal Grandmother   . Diabetes Sister   . Hypertension Sister   . Varicose Veins Sister   . Diabetes Brother   . Hypertension Brother   . Heart attack Brother   . Hypertension Daughter    History  Substance Use Topics  . Smoking status: Never Smoker   . Smokeless tobacco: Never Used  . Alcohol Use: No   OB History   Grav Para Term Preterm Abortions TAB SAB Ect Mult Living                 Review of Systems  All other systems reviewed and are negative.      Allergies  Latanoprost; Pentoxifylline; Versed; Warfarin sodium; Alphagan p; Oxycodone hcl; Avelox; Clonidine derivatives; Diltiazem hcl; Doxycycline; Hydralazine; Meloxicam; Moxifloxacin; Contrast media; and Iron  Home Medications  Current Outpatient Rx  Name  Route  Sig  Dispense  Refill  . aliskiren (TEKTURNA) 300 MG tablet   Oral   Take 150 mg by mouth at bedtime.          . ALPRAZolam (XANAX) 0.25 MG tablet   Oral   Take 1 tablet (0.25 mg total) by mouth at bedtime as needed for sleep.   30 tablet   3   . amLODipine (NORVASC) 10 MG tablet   Oral   Take 1 tablet (10 mg total) by mouth daily.   30 tablet   2   . aspirin EC 81 MG tablet   Oral   Take 81 mg by mouth daily.         . Cholecalciferol (VITAMIN D) 2000 UNITS tablet   Oral   Take 4,000 Units by mouth daily.         . clopidogrel (PLAVIX) 75 MG tablet   Oral   Take 75 mg by mouth daily.         . collagenase (SANTYL) ointment   Topical   Apply  1 application topically daily as needed (wound care (right foot)).         . docusate sodium (COLACE) 100 MG capsule   Oral   Take 100 mg by mouth 2 (two) times daily as needed for mild constipation.         . dorzolamide (TRUSOPT) 2 % ophthalmic solution   Left Eye   Place 1 drop into the left eye 2 (two) times daily.   10 mL   11   . fentaNYL (DURAGESIC) 12 MCG/HR   Transdermal   Place 1 patch (12.5 mcg total) onto the skin every 3 (three) days.   5 patch   0   . gabapentin (NEURONTIN) 100 MG capsule   Oral   Take 1 capsule (100 mg total) by mouth at bedtime.   30 capsule   11   . HYDROcodone-acetaminophen (NORCO/VICODIN) 5-325 MG per tablet   Oral   Take 2 tablets by mouth every 6 (six) hours as needed for moderate pain.   40 tablet   0   . lisinopril (PRINIVIL,ZESTRIL) 20 MG tablet   Oral   Take 10 mg by mouth 2 (two) times daily.         Marland Kitchen loteprednol (LOTEMAX) 0.5 % ophthalmic suspension   Both Eyes   Place 1 drop into both eyes daily.         . Multiple Vitamin (MULTIVITAMIN WITH MINERALS) TABS tablet   Oral   Take 1 tablet by mouth daily.         Marland Kitchen POTASSIUM PO   Oral   Take 1 tablet by mouth daily.         Marland Kitchen pyridOXINE (VITAMIN B-6) 100 MG tablet   Oral   Take 100 mg by mouth daily.         Marland Kitchen sulfamethoxazole-trimethoprim (SEPTRA DS) 800-160 MG per tablet   Oral   Take 1 tablet by mouth 2 (two) times daily.   16 tablet   0   . vitamin C (ASCORBIC ACID) 500 MG tablet   Oral   Take 500 mg by mouth daily.         . Zinc Sulfate (ZINC 15 PO)   Oral   Take 15 mg by mouth daily.          Marland Kitchen HYDROmorphone (DILAUDID) 4 MG tablet   Oral   Take  1 tablet (4 mg total) by mouth every 4 (four) hours as needed for severe pain.   15 tablet   0    BP 163/55  Pulse 71  Temp(Src) 98.6 F (37 C) (Oral)  Resp 18  SpO2 100% Physical Exam  Nursing note and vitals reviewed. Constitutional: She is oriented to person, place, and time. She  appears well-developed and well-nourished. No distress.  HENT:  Head: Normocephalic and atraumatic.  Neck: Normal range of motion. Neck supple.  Cardiovascular: Normal rate and regular rhythm.   Pulmonary/Chest: Effort normal.  Musculoskeletal: Normal range of motion. She exhibits no edema.  The right foot is noted to have an ulcer to the lateral aspect of the foot and second and third toes. There is no purulent drainage and no significant surrounding erythema. There is no streaking into the leg.  Neurological: She is alert and oriented to person, place, and time.  Skin: Skin is warm and dry. She is not diaphoretic.    ED Course  Procedures (including critical care time) Labs Review Labs Reviewed  CBC WITH DIFFERENTIAL - Abnormal; Notable for the following:    RBC 3.69 (*)    Hemoglobin 10.5 (*)    HCT 30.8 (*)    RDW 15.9 (*)    All other components within normal limits  BASIC METABOLIC PANEL - Abnormal; Notable for the following:    GFR calc non Af Amer 47 (*)    GFR calc Af Amer 54 (*)    All other components within normal limits   Imaging Review Dg Foot Complete Right  05/15/2013   CLINICAL DATA:  Right foot ulcers and cellulitis with swelling and redness and weeping pus.  EXAM: RIGHT FOOT COMPLETE - 3+ VIEW  COMPARISON:  CT dated 05/08/2013.  FINDINGS: Bandage material overlying the distal foot, laterally. Stable calcific density along the lateral aspect of the foot at the level of the fifth MTP joint. No bone destruction or periosteal reaction. No soft tissue gas. Calcaneal spurs.  IMPRESSION: No radiographic evidence of osteomyelitis.   Electronically Signed   By: Enrique Sack M.D.   On: 05/15/2013 19:41     EKG Interpretation None      MDM   Final diagnoses:  Foot pain  Foot ulcer  Cellulitis    Patient presents with severe pain in her foot. She's been diagnosed with ulcers and cellulitis and treated with antibiotics and pain medications, however her pain is not  improving. Physical examination reveals the wounds to be healing. There is granulation tissue present and no purulent discharge. She has no elevation of white count or fever an x-ray does not reveal osteomyelitis. She was given 2 doses of clotted and states that she actually feels better. At this point I feel as though she is appropriate for discharge. She will be prescribed dilaudid pills which she can take as needed for her pain.  The daughter was initially quite insistent that her mother be admitted. I see no medical indication for this. As the patient is feeling much better, I feel as though a trial of pain medication at home is most appropriate. They're to return if her symptoms worsen or change.    Veryl Speak, MD 05/15/13 [phone]

## 2013-05-15 NOTE — Discharge Instructions (Signed)
Continue your antibiotics as prescribed.  Dilaudid as prescribed as needed for pain.   Cellulitis Cellulitis is an infection of the skin and the tissue beneath it. The infected area is usually red and tender. Cellulitis occurs most often in the arms and lower legs.  CAUSES  Cellulitis is caused by bacteria that enter the skin through cracks or cuts in the skin. The most common types of bacteria that cause cellulitis are Staphylococcus and Streptococcus. SYMPTOMS   Redness and warmth.  Swelling.  Tenderness or pain.  Fever. DIAGNOSIS  Your caregiver can usually determine what is wrong based on a physical exam. Blood tests may also be done. TREATMENT  Treatment usually involves taking an antibiotic medicine. HOME CARE INSTRUCTIONS   Take your antibiotics as directed. Finish them even if you start to feel better.  Keep the infected arm or leg elevated to reduce swelling.  Apply a warm cloth to the affected area up to 4 times per day to relieve pain.  Only take over-the-counter or prescription medicines for pain, discomfort, or fever as directed by your caregiver.  Keep all follow-up appointments as directed by your caregiver. SEEK MEDICAL CARE IF:   You notice red streaks coming from the infected area.  Your red area gets larger or turns dark in color.  Your bone or joint underneath the infected area becomes painful after the skin has healed.  Your infection returns in the same area or another area.  You notice a swollen bump in the infected area.  You develop new symptoms. SEEK IMMEDIATE MEDICAL CARE IF:   You have a fever.  You feel very sleepy.  You develop vomiting or diarrhea.  You have a general ill feeling (malaise) with muscle aches and pains. MAKE SURE YOU:   Understand these instructions.  Will watch your condition.  Will get help right away if you are not doing well or get worse. Document Released: 11/13/2004 Document Revised: 08/05/2011 Document  Reviewed: 04/21/2011 [facility] Patient Information 2014 [place].

## 2013-05-15 NOTE — ED Notes (Addendum)
Pt from home. Has had right foot debridement for wounds a week ago Friday and this past Friday. States that pain meds are not helping her pain. Pt reports pain 10/10. Pt is being followed by a wound clinic and saw them on Friday. States Norco, Fentanyl patch, and Ultram are not helping her pain.

## 2013-05-15 NOTE — ED Notes (Signed)
Patient transported to X-ray 

## 2013-05-16 ENCOUNTER — Telehealth: Payer: Self-pay

## 2013-05-16 ENCOUNTER — Telehealth: Payer: Self-pay | Admitting: *Deleted

## 2013-05-16 DIAGNOSIS — M25579 Pain in unspecified ankle and joints of unspecified foot: Secondary | ICD-10-CM

## 2013-05-16 NOTE — Telephone Encounter (Signed)
pts daughter called requesting referral to pain clinic and also Rx for manual wheelchair.  Further states pts has appointment with Dr Alain Marion on Wed 4.1.15. Please advise

## 2013-05-16 NOTE — Telephone Encounter (Signed)
Ok Thx 

## 2013-05-16 NOTE — Telephone Encounter (Signed)
[facility] notified via voicemail orders for PT are okay.

## 2013-05-16 NOTE — Telephone Encounter (Signed)
Phone call from physical therapist with [facility] [phone] stating patient has been evaluated and requesting verbal order to see patient one time a week for one week then two times a week for three weeks. Please advise. Thanks.

## 2013-05-16 NOTE — Telephone Encounter (Signed)
OK w/c I'll ref to pain clinic Thx

## 2013-05-17 ENCOUNTER — Institutional Professional Consult (permissible substitution): Payer: Medicare Other | Admitting: Emergency Medicine

## 2013-05-17 NOTE — Telephone Encounter (Signed)
Spoke with pts daughter advised orders in place, wheelchair Rx ready.

## 2013-05-18 ENCOUNTER — Encounter: Payer: Self-pay | Admitting: Internal Medicine

## 2013-05-18 ENCOUNTER — Ambulatory Visit (INDEPENDENT_AMBULATORY_CARE_PROVIDER_SITE_OTHER): Payer: Medicare Other | Admitting: Internal Medicine

## 2013-05-18 ENCOUNTER — Other Ambulatory Visit (INDEPENDENT_AMBULATORY_CARE_PROVIDER_SITE_OTHER): Payer: Medicare Other

## 2013-05-18 ENCOUNTER — Telehealth: Payer: Self-pay

## 2013-05-18 VITALS — BP 130/42 | HR 76 | Temp 98.9°F | Resp 16 | Wt 111.0 lb

## 2013-05-18 DIAGNOSIS — I739 Peripheral vascular disease, unspecified: Secondary | ICD-10-CM

## 2013-05-18 DIAGNOSIS — I10 Essential (primary) hypertension: Secondary | ICD-10-CM

## 2013-05-18 DIAGNOSIS — L97519 Non-pressure chronic ulcer of other part of right foot with unspecified severity: Secondary | ICD-10-CM

## 2013-05-18 DIAGNOSIS — L98499 Non-pressure chronic ulcer of skin of other sites with unspecified severity: Secondary | ICD-10-CM

## 2013-05-18 DIAGNOSIS — L97509 Non-pressure chronic ulcer of other part of unspecified foot with unspecified severity: Secondary | ICD-10-CM

## 2013-05-18 LAB — BASIC METABOLIC PANEL
BUN: 17 mg/dL (ref 6–23)
CHLORIDE: 102 meq/L (ref 96–112)
CO2: 24 meq/L (ref 19–32)
Calcium: 9.7 mg/dL (ref 8.4–10.5)
Creatinine, Ser: 1.1 mg/dL (ref 0.4–1.2)
GFR: 52.59 mL/min — ABNORMAL LOW (ref >60.00)
Glucose, Bld: 102 mg/dL — ABNORMAL HIGH (ref 70–99)
Potassium: 4.1 mEq/L (ref 3.5–5.1)
Sodium: 135 mEq/L (ref 135–145)

## 2013-05-18 LAB — CBC WITH DIFFERENTIAL/PLATELET
Basophils Absolute: 0 10*3/uL (ref 0.0–0.1)
Basophils Relative: 0.4 % (ref 0.0–3.0)
EOS ABS: 0.1 10*3/uL (ref 0.0–0.7)
Eosinophils Relative: 1.7 % (ref 0.0–5.0)
HCT: 37.1 % (ref 36.0–46.0)
Hemoglobin: 12.2 g/dL (ref 12.0–15.0)
LYMPHS PCT: 25.2 % (ref 12.0–46.0)
Lymphs Abs: 2.1 10*3/uL (ref 0.7–4.0)
MCHC: 32.9 g/dL (ref 30.0–36.0)
MCV: 86.4 fl (ref 78.0–100.0)
Monocytes Absolute: 0.6 10*3/uL (ref 0.1–1.0)
Monocytes Relative: 6.6 % (ref 3.0–12.0)
NEUTROS ABS: 5.6 10*3/uL (ref 1.4–7.7)
Neutrophils Relative %: 66.1 % (ref 43.0–77.0)
Platelets: 253 10*3/uL (ref 150.0–400.0)
RBC: 4.29 Mil/uL (ref 3.87–5.11)
RDW: 16.5 % — AB (ref 11.5–14.6)
WBC: 8.4 10*3/uL (ref 4.5–10.5)

## 2013-05-18 MED ORDER — AMLODIPINE BESYLATE 10 MG PO TABS
10.0000 mg | ORAL_TABLET | Freq: Every day | ORAL | Status: DC
Start: 2013-05-18 — End: 2014-04-26

## 2013-05-18 MED ORDER — ALPRAZOLAM 0.25 MG PO TABS: 0.2500 mg | ORAL_TABLET | Freq: Every evening | ORAL | Status: DC | PRN

## 2013-05-18 NOTE — Telephone Encounter (Signed)
The patient's daughter called and stated the pain clinic is not going to refill her pain medication for one month.  She is hoping to get a refill until the pain clinic can refill this med.   [facility] - [phone] (daughter - Joycelyn Schmid)

## 2013-05-18 NOTE — Assessment & Plan Note (Signed)
Continue with current prescription therapy as reflected on the Med list.  

## 2013-05-18 NOTE — Progress Notes (Signed)
   Subjective:     HPI  C/o R toes #3-4 pain and foot wound w/severe pain - chronic. Wound Clinic She was recently put on dilaudid, Norco and a Duragesic patch   F/u itching all over- resolved off Hydralazine. Face was peeling on 300 mg Tekturna - resolved F/u scalp and eyebrows itching - resolved  F/u B legs new swelling and tenderness on 4/21 - better. Dr Tomi Bamberger gave her Keflex x 2-3 wks, then Doxy (still on it) - it did not help. C/o PVD, tingling, pain. C/o LBP  BP Readings from Last 3 Encounters:  05/18/13 130/42  05/15/13 163/55  05/10/13 138/48   Wt Readings from Last 3 Encounters:  05/18/13 111 lb (50.349 kg)  05/08/13 116 lb 6.5 oz (52.8 kg)  04/28/13 111 lb (50.349 kg)     Review of Systems  Constitutional: Positive for fatigue. Negative for chills and unexpected weight change.  HENT: Positive for hearing loss. Negative for congestion, nosebleeds and sore throat.   Respiratory: Negative for cough, choking and wheezing.   Cardiovascular: Positive for leg swelling. Negative for chest pain and palpitations.  Gastrointestinal: Negative for nausea and abdominal pain.  Endocrine: Negative for polydipsia.  Genitourinary: Negative for flank pain, vaginal bleeding, vaginal discharge and pelvic pain.  Musculoskeletal: Positive for arthralgias, gait problem and myalgias. Negative for back pain and joint swelling.  Skin:       Redness B ankles, not warm or tender Toes are purplish B  Neurological: Negative for seizures and weakness.  Psychiatric/Behavioral: Negative for suicidal ideas and decreased concentration. The patient is not nervous/anxious.        Objective:   Physical Exam  Constitutional: She appears well-developed and well-nourished. No distress.  HENT:  Head: Normocephalic.  Nose: Nose normal.  Mouth/Throat: Oropharynx is clear and moist. No oropharyngeal exudate.  Eyes: Left eye exhibits no discharge. No scleral icterus.  Neck: No JVD present. No tracheal  deviation present. No thyromegaly present.  Cardiovascular: Exam reveals no gallop and no friction rub.   Murmur heard. irreg  Pulmonary/Chest: She has no wheezes. She has no rales. She exhibits no tenderness.  Abdominal: She exhibits no distension and no mass. There is no guarding.  Musculoskeletal: She exhibits edema. She exhibits no tenderness.  Trace B  Lymphadenopathy:    She has no cervical adenopathy.  Skin: No rash noted. There is erythema.  Redness B ankles, not warm or tender Toes are purplish B  Psychiatric: She has a normal mood and affect. Judgment normal.   Labs reviewed     Assessment & Plan:

## 2013-05-18 NOTE — Progress Notes (Signed)
Pre visit review using our clinic review tool, if applicable. No additional management support is needed unless otherwise documented below in the visit note. 

## 2013-05-19 LAB — SEDIMENTATION RATE: Sed Rate: 51 mm/hr — ABNORMAL HIGH (ref 0–22)

## 2013-05-19 MED ORDER — FENTANYL 25 MCG/HR TD PT72: 25.0000 ug | MEDICATED_PATCH | TRANSDERMAL | Status: DC

## 2013-05-19 MED ORDER — HYDROMORPHONE HCL 4 MG PO TABS: 4.0000 mg | ORAL_TABLET | ORAL | Status: DC | PRN

## 2013-05-19 NOTE — Telephone Encounter (Signed)
Ok per MD--Dilaudid and Fentanyl 25 mg. Rxs printed/signed and given to pt's daughter.

## 2013-05-20 ENCOUNTER — Encounter (HOSPITAL_BASED_OUTPATIENT_CLINIC_OR_DEPARTMENT_OTHER): Payer: Medicare Other | Attending: General Surgery

## 2013-05-20 DIAGNOSIS — L97509 Non-pressure chronic ulcer of other part of unspecified foot with unspecified severity: Secondary | ICD-10-CM | POA: Insufficient documentation

## 2013-05-20 LAB — PROTEIN ELECTROPHORESIS, SERUM
ALPHA-2-GLOBULIN: 13.3 % — AB (ref 7.1–11.8)
Albumin ELP: 55.7 % — ABNORMAL LOW (ref 55.8–66.1)
Alpha-1-Globulin: 6.1 % — ABNORMAL HIGH (ref 2.9–4.9)
BETA 2: 3.7 % (ref 3.2–6.5)
Beta Globulin: 6.4 % (ref 4.7–7.2)
Gamma Globulin: 14.8 % (ref 11.1–18.8)
TOTAL PROTEIN, SERUM ELECTROPHOR: 6.9 g/dL (ref 6.0–8.3)

## 2013-05-22 NOTE — Assessment & Plan Note (Addendum)
2015 chronic - worse; uncontrollable pain worse in 4/15 - uncontrolled w/narcotics In my opinion Ms Regina Hamilton will have to have a R BKA to control pain/ulcers. Discussed w/pt and dtrs. They have an appt w/Pain Clinic today. They would like to have a 2nd opinion w/another Pain Clinic

## 2013-05-23 ENCOUNTER — Encounter: Payer: Self-pay | Admitting: Family

## 2013-05-24 ENCOUNTER — Inpatient Hospital Stay (HOSPITAL_COMMUNITY)
Admission: AD | Admit: 2013-05-24 | Discharge: 2013-05-27 | DRG: 239 | Disposition: A | Discharge status: Skilled Nursing Facility | Payer: Medicare Other | Source: Ambulatory Visit | Attending: Internal Medicine | Admitting: Internal Medicine

## 2013-05-24 ENCOUNTER — Encounter: Payer: Self-pay | Admitting: Family

## 2013-05-24 ENCOUNTER — Ambulatory Visit (INDEPENDENT_AMBULATORY_CARE_PROVIDER_SITE_OTHER): Payer: Medicare Other | Admitting: Family

## 2013-05-24 VITALS — BP 138/64 | HR 69 | Resp 16 | Ht 63.0 in | Wt 111.0 lb

## 2013-05-24 DIAGNOSIS — L98499 Non-pressure chronic ulcer of skin of other sites with unspecified severity: Principal | ICD-10-CM | POA: Diagnosis present

## 2013-05-24 DIAGNOSIS — L97509 Non-pressure chronic ulcer of other part of unspecified foot with unspecified severity: Secondary | ICD-10-CM | POA: Diagnosis present

## 2013-05-24 DIAGNOSIS — Z885 Allergy status to narcotic agent status: Secondary | ICD-10-CM

## 2013-05-24 DIAGNOSIS — I739 Peripheral vascular disease, unspecified: Principal | ICD-10-CM

## 2013-05-24 DIAGNOSIS — Z888 Allergy status to other drugs, medicaments and biological substances status: Secondary | ICD-10-CM

## 2013-05-24 DIAGNOSIS — E43 Unspecified severe protein-calorie malnutrition: Secondary | ICD-10-CM

## 2013-05-24 DIAGNOSIS — I6529 Occlusion and stenosis of unspecified carotid artery: Secondary | ICD-10-CM | POA: Diagnosis present

## 2013-05-24 DIAGNOSIS — Z833 Family history of diabetes mellitus: Secondary | ICD-10-CM

## 2013-05-24 DIAGNOSIS — I872 Venous insufficiency (chronic) (peripheral): Secondary | ICD-10-CM

## 2013-05-24 DIAGNOSIS — I4891 Unspecified atrial fibrillation: Secondary | ICD-10-CM

## 2013-05-24 DIAGNOSIS — L97519 Non-pressure chronic ulcer of other part of right foot with unspecified severity: Secondary | ICD-10-CM

## 2013-05-24 DIAGNOSIS — Z89519 Acquired absence of unspecified leg below knee: Secondary | ICD-10-CM

## 2013-05-24 DIAGNOSIS — R5381 Other malaise: Secondary | ICD-10-CM | POA: Diagnosis present

## 2013-05-24 DIAGNOSIS — R5383 Other fatigue: Secondary | ICD-10-CM

## 2013-05-24 DIAGNOSIS — I999 Unspecified disorder of circulatory system: Secondary | ICD-10-CM

## 2013-05-24 DIAGNOSIS — K5900 Constipation, unspecified: Secondary | ICD-10-CM | POA: Diagnosis not present

## 2013-05-24 DIAGNOSIS — Z950 Presence of cardiac pacemaker: Secondary | ICD-10-CM

## 2013-05-24 DIAGNOSIS — M7989 Other specified soft tissue disorders: Secondary | ICD-10-CM

## 2013-05-24 DIAGNOSIS — M79609 Pain in unspecified limb: Secondary | ICD-10-CM

## 2013-05-24 DIAGNOSIS — M129 Arthropathy, unspecified: Secondary | ICD-10-CM | POA: Diagnosis present

## 2013-05-24 DIAGNOSIS — I7092 Chronic total occlusion of artery of the extremities: Secondary | ICD-10-CM | POA: Diagnosis present

## 2013-05-24 DIAGNOSIS — F411 Generalized anxiety disorder: Secondary | ICD-10-CM | POA: Diagnosis present

## 2013-05-24 DIAGNOSIS — Z8249 Family history of ischemic heart disease and other diseases of the circulatory system: Secondary | ICD-10-CM

## 2013-05-24 DIAGNOSIS — I70229 Atherosclerosis of native arteries of extremities with rest pain, unspecified extremity: Secondary | ICD-10-CM

## 2013-05-24 DIAGNOSIS — D62 Acute posthemorrhagic anemia: Secondary | ICD-10-CM | POA: Diagnosis not present

## 2013-05-24 DIAGNOSIS — E46 Unspecified protein-calorie malnutrition: Secondary | ICD-10-CM

## 2013-05-24 DIAGNOSIS — IMO0002 Reserved for concepts with insufficient information to code with codable children: Secondary | ICD-10-CM

## 2013-05-24 DIAGNOSIS — L02419 Cutaneous abscess of limb, unspecified: Secondary | ICD-10-CM | POA: Diagnosis present

## 2013-05-24 DIAGNOSIS — G609 Hereditary and idiopathic neuropathy, unspecified: Secondary | ICD-10-CM | POA: Diagnosis present

## 2013-05-24 DIAGNOSIS — I10 Essential (primary) hypertension: Secondary | ICD-10-CM

## 2013-05-24 DIAGNOSIS — G629 Polyneuropathy, unspecified: Secondary | ICD-10-CM | POA: Diagnosis present

## 2013-05-24 DIAGNOSIS — I998 Other disorder of circulatory system: Secondary | ICD-10-CM | POA: Diagnosis present

## 2013-05-24 DIAGNOSIS — E785 Hyperlipidemia, unspecified: Secondary | ICD-10-CM | POA: Diagnosis present

## 2013-05-24 DIAGNOSIS — L03119 Cellulitis of unspecified part of limb: Secondary | ICD-10-CM

## 2013-05-24 DIAGNOSIS — Z981 Arthrodesis status: Secondary | ICD-10-CM

## 2013-05-24 DIAGNOSIS — I5032 Chronic diastolic (congestive) heart failure: Secondary | ICD-10-CM | POA: Diagnosis present

## 2013-05-24 HISTORY — DX: Other disorder of circulatory system: I99.8

## 2013-05-24 HISTORY — DX: Other complications of amputation stump: T87.89

## 2013-05-24 MED ORDER — POLYETHYLENE GLYCOL 3350 17 G PO PACK
17.0000 g | PACK | Freq: Every day | ORAL | Status: DC
  Administered 2013-05-26 – 2013-05-27 (×2): 17 g via ORAL
  Filled 2013-05-24 (×4): qty 1

## 2013-05-24 MED ORDER — ALPRAZOLAM 0.25 MG PO TABS
0.2500 mg | ORAL_TABLET | Freq: Every evening | ORAL | Status: DC | PRN
  Administered 2013-05-24 – 2013-05-26 (×3): 0.25 mg via ORAL
  Filled 2013-05-24 (×3): qty 1

## 2013-05-24 MED ORDER — ONDANSETRON HCL 4 MG PO TABS: 4.0000 mg | ORAL_TABLET | Freq: Four times a day (QID) | ORAL | Status: DC | PRN

## 2013-05-24 MED ORDER — VITAMIN B-6 100 MG PO TABS
100.0000 mg | ORAL_TABLET | Freq: Every day | ORAL | Status: DC
  Administered 2013-05-26 – 2013-05-27 (×2): 100 mg via ORAL
  Filled 2013-05-24 (×3): qty 1

## 2013-05-24 MED ORDER — ADULT MULTIVITAMIN W/MINERALS CH
1.0000 | ORAL_TABLET | Freq: Every day | ORAL | Status: DC
  Administered 2013-05-26 – 2013-05-27 (×2): 1 via ORAL
  Filled 2013-05-24 (×3): qty 1

## 2013-05-24 MED ORDER — ASPIRIN EC 81 MG PO TBEC
81.0000 mg | DELAYED_RELEASE_TABLET | Freq: Every day | ORAL | Status: DC
  Administered 2013-05-26 – 2013-05-27 (×2): 81 mg via ORAL
  Filled 2013-05-24 (×4): qty 1

## 2013-05-24 MED ORDER — SODIUM CHLORIDE 0.9 % IV SOLN
INTRAVENOUS | Status: DC
Start: 2013-05-24 — End: 2013-05-27
  Administered 2013-05-24 – 2013-05-26 (×3): via INTRAVENOUS

## 2013-05-24 MED ORDER — ACETAMINOPHEN 325 MG PO TABS: 650.0000 mg | ORAL_TABLET | Freq: Four times a day (QID) | ORAL | Status: DC | PRN

## 2013-05-24 MED ORDER — ENOXAPARIN SODIUM 30 MG/0.3ML ~~LOC~~ SOLN
30.0000 mg | SUBCUTANEOUS | Status: DC
  Administered 2013-05-24 – 2013-05-26 (×2): 30 mg via SUBCUTANEOUS
  Filled 2013-05-24 (×4): qty 0.3

## 2013-05-24 MED ORDER — VITAMIN C 500 MG PO TABS
500.0000 mg | ORAL_TABLET | Freq: Every day | ORAL | Status: DC
  Administered 2013-05-26 – 2013-05-27 (×2): 500 mg via ORAL
  Filled 2013-05-24 (×3): qty 1

## 2013-05-24 MED ORDER — COLLAGENASE 250 UNIT/GM EX OINT
1.0000 "application " | TOPICAL_OINTMENT | Freq: Every day | CUTANEOUS | Status: DC | PRN
  Filled 2013-05-24: qty 30

## 2013-05-24 MED ORDER — DOCUSATE SODIUM 100 MG PO CAPS: 100.0000 mg | ORAL_CAPSULE | Freq: Two times a day (BID) | ORAL | Status: DC | PRN

## 2013-05-24 MED ORDER — ALISKIREN FUMARATE 150 MG PO TABS
150.0000 mg | ORAL_TABLET | Freq: Every day | ORAL | Status: DC
  Administered 2013-05-25: 150 mg via ORAL
  Filled 2013-05-24 (×3): qty 1

## 2013-05-24 MED ORDER — HYDROCODONE-ACETAMINOPHEN 5-325 MG PO TABS
2.0000 | ORAL_TABLET | Freq: Four times a day (QID) | ORAL | Status: DC | PRN
  Administered 2013-05-24 – 2013-05-25 (×3): 2 via ORAL
  Filled 2013-05-24 (×3): qty 2

## 2013-05-24 MED ORDER — DEXTROSE 5 % IV SOLN
1.5000 g | INTRAVENOUS | Status: AC
  Administered 2013-05-25: 1.5 g via INTRAVENOUS
  Filled 2013-05-24: qty 1.5

## 2013-05-24 MED ORDER — ONDANSETRON HCL 4 MG/2ML IJ SOLN
4.0000 mg | Freq: Four times a day (QID) | INTRAMUSCULAR | Status: DC | PRN
  Administered 2013-05-24 – 2013-05-25 (×3): 4 mg via INTRAVENOUS
  Filled 2013-05-24 (×3): qty 2

## 2013-05-24 MED ORDER — FENTANYL 12 MCG/HR TD PT72
25.0000 ug | MEDICATED_PATCH | TRANSDERMAL | Status: DC
  Administered 2013-05-26: 25 ug via TRANSDERMAL
  Filled 2013-05-24: qty 2

## 2013-05-24 MED ORDER — DOCUSATE SODIUM 100 MG PO CAPS: 100.0000 mg | ORAL_CAPSULE | Freq: Two times a day (BID) | ORAL | Status: DC

## 2013-05-24 MED ORDER — HYDROMORPHONE HCL PF 1 MG/ML IJ SOLN
1.0000 mg | INTRAMUSCULAR | Status: DC | PRN
  Administered 2013-05-24 – 2013-05-26 (×4): 1 mg via INTRAVENOUS
  Filled 2013-05-24 (×4): qty 1

## 2013-05-24 MED ORDER — LISINOPRIL 10 MG PO TABS
10.0000 mg | ORAL_TABLET | Freq: Two times a day (BID) | ORAL | Status: DC
  Administered 2013-05-25 (×2): 10 mg via ORAL
  Filled 2013-05-24 (×5): qty 1

## 2013-05-24 MED ORDER — SODIUM CHLORIDE 0.9 % IJ SOLN
3.0000 mL | Freq: Two times a day (BID) | INTRAMUSCULAR | Status: DC
  Administered 2013-05-26 – 2013-05-27 (×2): 3 mL via INTRAVENOUS

## 2013-05-24 MED ORDER — VITAMIN D3 25 MCG (1000 UNIT) PO TABS
4000.0000 [IU] | ORAL_TABLET | Freq: Every day | ORAL | Status: DC
  Administered 2013-05-26 – 2013-05-27 (×2): 4000 [IU] via ORAL
  Filled 2013-05-24 (×4): qty 4

## 2013-05-24 MED ORDER — ACETAMINOPHEN 650 MG RE SUPP: 650.0000 mg | Freq: Four times a day (QID) | RECTAL | Status: DC | PRN

## 2013-05-24 MED ORDER — AMLODIPINE BESYLATE 10 MG PO TABS
10.0000 mg | ORAL_TABLET | Freq: Every day | ORAL | Status: DC
  Administered 2013-05-25 – 2013-05-27 (×3): 10 mg via ORAL
  Filled 2013-05-24 (×4): qty 1

## 2013-05-24 MED ORDER — LOTEPREDNOL ETABONATE 0.5 % OP SUSP
1.0000 [drp] | Freq: Every day | OPHTHALMIC | Status: DC
  Administered 2013-05-25 – 2013-05-27 (×3): 1 [drp] via OPHTHALMIC
  Filled 2013-05-24: qty 5

## 2013-05-24 NOTE — Progress Notes (Signed)
Utilization Review Completed.Regina Hamilton T4/08/2013  

## 2013-05-24 NOTE — H&P (Signed)
[facility] History and Physical  Regina Hamilton ZOX:[id number] DOB: 10-12-1923 DOA: 05/24/2013  Referring physician: Dr. early PCP: Walker Kehr, MD   Chief Complaint:  Persistent progressive pain over her right foot  HPI:  80 year old female with history of peripheral vascular disease (reportedly not a surgical candidate for revascularization),  A. Fib, hypertension and history of pacemaker plac who was recently hospitalized at [facility] for cellulitis of her right foot and completed 10 days of antibiotics was seen by her PCP and wound care clinic for worsening pain over her right foot . She was falling up at the wound care Center and podiatry for right foot and toe debridement. Following discharge from the hospital she continued to have pain over the right foot unimproved with the narcotics. She presented to the wound care center and was referred to vascular surgery clinic today. Patient was found to have ischemic changes over the right foot with drainage from her second and third toenails and ulcers over the lateral aspect of the right foot. No pulses were palpable below the popliteal. Given concern for a right foot ischemia and nonhealing ulcer with intractable pain recommended for hospital admission with plan on below-knee amputation on 4/8. Patient denies headache, dizziness, fever, chills, nausea , vomiting, chest pain, palpitations, SOB, abdominal pain, bowel or urinary symptoms.  reports poor appetite and weight loss.   Review of Systems:  Constitutional: Denies fever, chills, diaphoresis, appetite change and fatigue.  HEENT: Denies photophobia, eye pain, redness, hearing loss, ear pain, congestion, sore throat, rhinorrhea, sneezing, mouth sores, trouble swallowing, neck pain,  Respiratory: Denies SOB, DOE, cough, chest tightness,  and wheezing.   Cardiovascular: Denies chest pain, palpitations and leg swelling.  Gastrointestinal: Denies nausea, vomiting, abdominal pain,  diarrhea, constipation, blood in stool and abdominal distention.  Genitourinary: Denies dysuria, urgency, frequency, hematuria, flank pain and difficulty urinating.  Endocrine: Denies: hot or cold intolerance,polyuria, polydipsia. Musculoskeletal: Denies myalgias, back pain, joint swelling, arthralgias, right leg pain Skin: Denies pallor, rash and wound.  Neurological:  weakness, Denies dizziness, seizures, syncope, , light-headedness, numbness and headaches.     Past Medical History  Diagnosis Date  . Peripheral vascular disease   . Carotid artery occlusion   . Hypertension   . Hyperlipidemia   . Arthritis   . Pacemaker 2008    [facility] in [place], [place]  . Complication of anesthesia   . Dysrhythmia     atrial fibrilation  . Anxiety   . Chronic venous insufficiency     B  . Atrioventricular block, complete s/p AV junction ablation 11/19/2012  . Neuromuscular disorder     periphreal neuroptahty   Past Surgical History  Procedure Laterality Date  . Spine surgery  1970    Cervical fusion  . Cardio ablation- pacemaker    . Pr vein bypass graft,aorto-fem-pop    . Total hip revision      bilateral  . Cholecystectomy  05/20/11  . Angioplasty  03/01/2012  . Insert / replace / remove pacemaker    . Corneal transplant Bilateral   . Back surgery    . Abdominal aortagram  Feb. 16, 2015   Social History:  reports that she has never smoked. She has never used smokeless tobacco. She reports that she does not drink alcohol or use illicit drugs.  Allergies  Allergen Reactions  . Latanoprost Anaphylaxis    redness  . Pentoxifylline     Severe SOB, mental confusion per daughter  . Versed [Midazolam]  Other (See Comments)     hallucinations per daughter  . Warfarin Sodium Rash  . Alphagan P [Brimonidine Tartrate] Photosensitivity    Redness bilateral eye  . Oxycodone Hcl Itching  . Avelox [Moxifloxacin Hcl In Nacl] Other (See Comments)    tongue swelling  . Clonidine  Derivatives Other (See Comments)    Pt's daughter states she has severe hallucinations  . Diltiazem Hcl Hives  . Doxycycline Nausea And Vomiting  . Hydralazine Itching  . Meloxicam Other (See Comments)    Odd feeling  . Moxifloxacin Other (See Comments)     Tongue swelling; mouth ulcers; confusion  . Contrast Media [Iodinated Diagnostic Agents] Rash    Contrast dye.  . Iron Rash    Family History  Problem Relation Age of Onset  . Heart disease Mother     Heart disease before age 42  . Hypertension Mother   . Varicose Veins Mother   . Peripheral vascular disease Mother   . Other Mother     amputation  . Heart disease Father     Heat Diease before age 45  . Heart disease Maternal Grandmother   . Heart disease Paternal Grandmother   . Diabetes Sister   . Hypertension Sister   . Varicose Veins Sister   . Diabetes Brother   . Hypertension Brother   . Heart attack Brother   . Hypertension Daughter     Prior to Admission medications   Medication Sig Start Date End Date Taking? Authorizing Provider  aliskiren (TEKTURNA) 300 MG tablet Take 150 mg by mouth at bedtime.  01/17/13   Evie Lacks Plotnikov, MD  ALPRAZolam Duanne Moron) 0.25 MG tablet Take 1 tablet (0.25 mg total) by mouth at bedtime as needed for sleep. 05/18/13   Evie Lacks Plotnikov, MD  amLODipine (NORVASC) 10 MG tablet Take 1 tablet (10 mg total) by mouth daily. 05/18/13   Evie Lacks Plotnikov, MD  aspirin EC 81 MG tablet Take 81 mg by mouth daily.    Historical Provider, MD  Cholecalciferol (VITAMIN D) 2000 UNITS tablet Take 4,000 Units by mouth daily.    Historical Provider, MD  clopidogrel (PLAVIX) 75 MG tablet Take 75 mg by mouth daily.    Historical Provider, MD  collagenase (SANTYL) ointment Apply 1 application topically daily as needed (wound care (right foot)).    Historical Provider, MD  docusate sodium (COLACE) 100 MG capsule Take 100 mg by mouth 2 (two) times daily as needed for mild constipation.    Historical  Provider, MD  dorzolamide (TRUSOPT) 2 % ophthalmic solution Place 1 drop into the left eye 2 (two) times daily. 01/17/13   Evie Lacks Plotnikov, MD  fentaNYL (DURAGESIC) 25 MCG/HR patch Place 1 patch (25 mcg total) onto the skin every 3 (three) days. 05/19/13   Evie Lacks Plotnikov, MD  gabapentin (NEURONTIN) 100 MG capsule Take 1 capsule (100 mg total) by mouth at bedtime. 01/17/13   Cassandria Anger, MD  HYDROcodone-acetaminophen (NORCO/VICODIN) 5-325 MG per tablet Take 2 tablets by mouth every 6 (six) hours as needed for moderate pain. 05/10/13   Miyoko Hashimi, MD  HYDROmorphone (DILAUDID) 4 MG tablet Take 1 tablet (4 mg total) by mouth every 4 (four) hours as needed for severe pain. 05/19/13   Evie Lacks Plotnikov, MD  lisinopril (PRINIVIL,ZESTRIL) 20 MG tablet Take 10 mg by mouth 2 (two) times daily. 01/17/13   Evie Lacks Plotnikov, MD  loteprednol (LOTEMAX) 0.5 % ophthalmic suspension Place 1 drop into both eyes daily.  Historical Provider, MD  Multiple Vitamin (MULTIVITAMIN WITH MINERALS) TABS tablet Take 1 tablet by mouth daily.    Historical Provider, MD  POTASSIUM PO Take 1 tablet by mouth daily.    Historical Provider, MD  pyridOXINE (VITAMIN B-6) 100 MG tablet Take 100 mg by mouth daily.    Historical Provider, MD  sulfamethoxazole-trimethoprim (SEPTRA DS) 800-160 MG per tablet Take 1 tablet by mouth 2 (two) times daily. 05/10/13   Cornel Werber, MD  vitamin C (ASCORBIC ACID) 500 MG tablet Take 500 mg by mouth daily.    Historical Provider, MD  Zinc Sulfate (ZINC 15 PO) Take 15 mg by mouth daily.     Historical Provider, MD     Physical Exam:  Filed Vitals:   05/24/13 1100  Height: 5\' 3"  (1.6 m)  Weight: 50.349 kg (111 lb)    Constitutional: Vital signs reviewed.  Patient is  an elderly thin built female in no acute distress  HEENT: no pallor, no icterus, moist oral mucosa,  Cardiovascular: RRR, S1 normal, S2 normal, no MRG Chest: CTAB, no wheezes, rales, or rhonchi Abdominal:  Soft. Non-tender, non-distended, bowel sounds are normal, no masses, organomegaly, or guarding present.  Ext:  no edema, absent pulsation below the right popliteal, superficial ulceration over the second and third toes and distal lateral foot. Tender to palpation.  Neurological: A&O x3, non focal  Labs on Admission:  Basic Metabolic Panel:  Recent Labs Lab 05/18/13 1126  NA 135  K 4.1  CL 102  CO2 24  GLUCOSE 102*  BUN 17  CREATININE 1.1  CALCIUM 9.7   Liver Function Tests: No results found for this basename: AST, ALT, ALKPHOS, BILITOT, PROT, ALBUMIN,  in the last 168 hours No results found for this basename: LIPASE, AMYLASE,  in the last 168 hours No results found for this basename: AMMONIA,  in the last 168 hours CBC:  Recent Labs Lab 05/18/13 1126  WBC 8.4  NEUTROABS 5.6  HGB 12.2  HCT 37.1  MCV 86.4  PLT 253.0   Cardiac Enzymes: No results found for this basename: CKTOTAL, CKMB, CKMBINDEX, TROPONINI,  in the last 168 hours BNP: No components found with this basename: POCBNP,  CBG: No results found for this basename: GLUCAP,  in the last 168 hours  Radiological Exams on Admission: No results found.  EKG: Pending.  Assessment/Plan Principal Problem:   Critical lower limb ischemia Patient recently admitted to the hospital for cellulitis over right lower leg and ulceration over the toes. Completed course of antibiotics however had persistent progressive pain in her right leg and foot not improved with pain medications. -Seen by vascular surgery as outpatient today with concern for ischemia of the right foot. -Patient admitted to telemetry with plan on right below knee amputation tomorrow morning. Dr. early following. -No signs of infection over the right foot and since he has completed a total of 10 days course of antibiotic for a recent cellulitis I will not start her on any empiric antibiotic at this time. -Continue pain control with Vicodin. We'll add when  necessary IV Dilaudid. Continue home dose of fentanyl patch. Patient has not tolerated increased dose of Neurontin and tramadol as outpatient.   Active Problems: Hypertension  Blood pressure stable  Continue tekturrna, amlodipine and lisinopril   Peripheral vascular disease  Continue aspirin ( hold am dose) . Hold Plavix for surgery  Malnutrition  Continue nutrition supplements . Will need reevaluation by nutrition consult as patient has significantly poor appetite  Atrial fibrillation   in sinus. Status post pacemaker for heart block . Monitor on telemetry     Diet:cardiac. N.p.o. after midnight  DVT prophylaxis: sq lovenox   Code Status: full code Family Communication: discussed with patient and her 2  daughters at bedside Disposition Plan: Pending inpatient workup  Louellen Molder [facility] Pager [phone]  Total time spent on admission :70 minutes  If 7PM-7AM, please contact night-coverage [email or web address] Password [facility] 05/24/2013, 12:57 PM

## 2013-05-24 NOTE — Progress Notes (Signed)
Chaplain called to say a prayer with pt before surgery tomorrow.  Chaplain responded and prayed with pt.

## 2013-05-24 NOTE — Progress Notes (Signed)
Established Critical Limb Ischemia Patient  History of Present Illness  Regina Hamilton is a 80 y.o. (06/15/1923) female patient of Dr. Scot Hamilton has had previous angioplasty of a small superficial femoral artery which subsequently occluded. Since Dr. Scot Hamilton saw her last, she developed a wound on the lateral aspect of her right foot and also on the tips of her right second and third toes. She's also had some erythema. She describes rest Hamilton in the right foot. Her activity is fairly limited.  Because of her age and somewhat fragile state, Dr. Scot Hamilton notes that she is not a good candidate for extensive revascularization.  Dr. Scot Hamilton notes that he reviewed her records from the [facility] primary care. She was started on Trental but apparently had a reaction to this and the family stopped this. She presents today referred by Dr. Jerline Hamilton from [facility] wound care clinic for severe Hamilton and ischemia in left foot. Her right foot wounds are not improving. She is using Fentanyl patch, Dilaudid tablet, Norco, and still having intractable Hamilton that keeps her awake at night.  Daughters state they cannot take her home today due to unrelenting Hamilton.   Daughters state that their mother had an impaction, that they manually disimpacted her. Patient is walking with a cane sometimes. Daughters states that patient lost about 20 pounds in the last couple of months since she is not eating or drinking much due to the Hamilton in her right foot.  04/03/2013 aortogram by Dr. Scot Hamilton: 1. The infrarenal aorta is widely patent as are both common ilac arteries, hypogastric arteries, and external iliac arteries.  2. On the right side, which is the symptomatic side, the common femoral, deep femoral, and superficial femoral artery are patent. The superficial femoral artery has mild disease and is somewhat small but is patent to the adductor canal. There is occlusion of the distal superficial femoral artery and the right and the above-knee  popliteal artery. There is reconstitution of the popliteal artery at the level of the knee which is occluded distally. The anterior tibial and posterior tibial arteries are occluded. The proximal peroneal was occluded with reconstitution of a severely diseased distal peroneal artery on the right.  3. On the left side, the common femoral and deep femoral artery are patent. The superficial femoral artery is occluded. There is a left femoral to above-knee popliteal artery bypass graft which is widely patent with no areas of stenosis within the graft. The pocket arteries patent focal tight stenosis at the level of the knee. The anterior tibial and posterior tibial arteries are occluded and a single vessel runoff on the left via the peroneal artery with some moderate diffuse disease of the proximal peroneal artery.   The patient's PMH, PSH, SH, FamHx, Med, and Allergies are unchanged from 03/30/2013.  On ROS today: see HPI for pertinent positives and negatives.  Physical Examination  Filed Vitals:   05/24/13 0946  BP: 138/64  Pulse: 69  Resp: 16   Filed Weights   05/24/13 0946  Weight: 111 lb (50.349 kg)   Body mass index is 19.67 kg/(m^2).  General: A&O x 3, WDWN, Ill appearing.  Eyes: PERRLA.  Pulmonary: Sym exp, good air movt, CTAB, no rales, rhonchi, or wheezing.  Cardiac: RRR, Nl S1, S2, no Murmurs.  Vascular: Vessel Right Left  Radial 2+Palpable 2+Palpable  Carotid  without bruit  without bruit  Aorta Not palpable N/A  Femoral 3+Palpable 3+Palpable  Popliteal Not palpable Not palpable  PT Not Palpable  Not Palpable  DP Not Palpable Not Palpable   Gastrointestinal: soft, NTND, -G/R.  Musculoskeletal: M/S 4/5 throughout, Extremities without ischemic changes except right foot with serous drainage from around toenails 2 and 3, and ulcer on right foot, lateral aspect base of metatarsus.  Neurologic: Hamilton in right foot to light touch, Motor exam as listed above  Medical  Decision Making  Regina Hamilton is a 80 y.o. female who presents with: right critical foot ischemia with non healing wounds and intractable Hamilton. Dr. Donnetta Hutching spoke with patient and her two daughters who are both RN's, and he examined patient. He also reviewed her aortogram images from 04/03/2013. Scheduled with Dr. Donnetta Hutching for right below the knee amputation tomorrow.  Thank you for allowing [place] to participate in this patient's care.  Regina Chambers, RN, MSN, FNP-C [facility] Office: [phone]  Clinic MD: Early  05/24/2013, 9:28 AM

## 2013-05-25 ENCOUNTER — Inpatient Hospital Stay (HOSPITAL_COMMUNITY): Payer: Medicare Other | Admitting: Anesthesiology

## 2013-05-25 ENCOUNTER — Encounter (HOSPITAL_COMMUNITY): Payer: Self-pay | Admitting: Certified Registered Nurse Anesthetist

## 2013-05-25 ENCOUNTER — Encounter (HOSPITAL_COMMUNITY): Payer: Medicare Other | Admitting: Anesthesiology

## 2013-05-25 ENCOUNTER — Ambulatory Visit (HOSPITAL_COMMUNITY): Admission: RE | Admit: 2013-05-25 | Payer: Medicare Other | Source: Ambulatory Visit | Admitting: Vascular Surgery

## 2013-05-25 ENCOUNTER — Encounter (HOSPITAL_COMMUNITY)
Admission: AD | Disposition: A | Discharge status: Skilled Nursing Facility | Payer: Self-pay | Source: Ambulatory Visit | Attending: Internal Medicine

## 2013-05-25 DIAGNOSIS — I739 Peripheral vascular disease, unspecified: Secondary | ICD-10-CM

## 2013-05-25 DIAGNOSIS — L98499 Non-pressure chronic ulcer of skin of other sites with unspecified severity: Secondary | ICD-10-CM

## 2013-05-25 HISTORY — PX: AMPUTATION: SHX166

## 2013-05-25 LAB — CBC
HCT: 31.8 % — ABNORMAL LOW (ref 36.0–46.0)
Hemoglobin: 10.6 g/dL — ABNORMAL LOW (ref 12.0–15.0)
MCH: 28.8 pg (ref 26.0–34.0)
MCHC: 33.3 g/dL (ref 30.0–36.0)
MCV: 86.4 fL (ref 78.0–100.0)
PLATELETS: 255 10*3/uL (ref 150–400)
RBC: 3.68 MIL/uL — AB (ref 3.87–5.11)
RDW: 16.6 % — ABNORMAL HIGH (ref 11.5–15.5)
WBC: 6.6 10*3/uL (ref 4.0–10.5)

## 2013-05-25 LAB — BASIC METABOLIC PANEL
BUN: 20 mg/dL (ref 6–23)
CALCIUM: 9.1 mg/dL (ref 8.4–10.5)
CO2: 24 mEq/L (ref 19–32)
CREATININE: 0.9 mg/dL (ref 0.50–1.10)
Chloride: 103 mEq/L (ref 96–112)
GFR calc Af Amer: 65 mL/min — ABNORMAL LOW (ref >90)
GFR calc non Af Amer: 56 mL/min — ABNORMAL LOW (ref >90)
GLUCOSE: 87 mg/dL (ref 70–99)
Potassium: 4.7 mEq/L (ref 3.7–5.3)
Sodium: 138 mEq/L (ref 137–147)

## 2013-05-25 LAB — PROTIME-INR
INR: 1.01 (ref 0.00–1.49)
PROTHROMBIN TIME: 13.1 s (ref 11.6–15.2)

## 2013-05-25 LAB — SURGICAL PCR SCREEN
MRSA, PCR: NEGATIVE
STAPHYLOCOCCUS AUREUS: NEGATIVE

## 2013-05-25 SURGERY — AMPUTATION BELOW KNEE
Anesthesia: General | Site: Leg Lower | Laterality: Right

## 2013-05-25 MED ORDER — POTASSIUM CHLORIDE CRYS ER 20 MEQ PO TBCR: 20.0000 meq | EXTENDED_RELEASE_TABLET | Freq: Every day | ORAL | Status: DC | PRN

## 2013-05-25 MED ORDER — PROPOFOL 10 MG/ML IV BOLUS
INTRAVENOUS | Status: AC
  Filled 2013-05-25: qty 20

## 2013-05-25 MED ORDER — ONDANSETRON HCL 4 MG/2ML IJ SOLN
INTRAMUSCULAR | Status: DC | PRN
  Administered 2013-05-25: 4 mg via INTRAVENOUS

## 2013-05-25 MED ORDER — METOPROLOL TARTRATE 1 MG/ML IV SOLN: 2.0000 mg | INTRAVENOUS | Status: DC | PRN

## 2013-05-25 MED ORDER — LABETALOL HCL 5 MG/ML IV SOLN
10.0000 mg | INTRAVENOUS | Status: DC | PRN
Start: 2013-05-25 — End: 2013-05-27
  Filled 2013-05-25: qty 4

## 2013-05-25 MED ORDER — HYDROMORPHONE HCL PF 1 MG/ML IJ SOLN
0.5000 mg | INTRAMUSCULAR | Status: AC | PRN
  Administered 2013-05-25 (×4): 0.5 mg via INTRAVENOUS
  Administered 2013-05-25: 0.25 mg via INTRAVENOUS

## 2013-05-25 MED ORDER — MORPHINE SULFATE 2 MG/ML IJ SOLN
2.0000 mg | INTRAMUSCULAR | Status: DC | PRN
Start: 2013-05-25 — End: 2013-05-26
  Administered 2013-05-25 – 2013-05-26 (×7): 4 mg via INTRAVENOUS
  Filled 2013-05-25 (×7): qty 2

## 2013-05-25 MED ORDER — FENTANYL CITRATE 0.05 MG/ML IJ SOLN
INTRAMUSCULAR | Status: AC
  Administered 2013-05-25: 50 ug via INTRAVENOUS
  Filled 2013-05-25: qty 2

## 2013-05-25 MED ORDER — DORZOLAMIDE HCL 2 % OP SOLN
1.0000 [drp] | Freq: Two times a day (BID) | OPHTHALMIC | Status: DC
  Administered 2013-05-25 – 2013-05-26 (×4): 1 [drp] via OPHTHALMIC
  Filled 2013-05-25: qty 10

## 2013-05-25 MED ORDER — PHENOL 1.4 % MT LIQD
1.0000 | OROMUCOSAL | Status: DC | PRN
  Filled 2013-05-25: qty 177

## 2013-05-25 MED ORDER — HYDROMORPHONE HCL PF 1 MG/ML IJ SOLN
INTRAMUSCULAR | Status: AC
  Administered 2013-05-25: 0.25 mg via INTRAVENOUS
  Filled 2013-05-25: qty 1

## 2013-05-25 MED ORDER — GABAPENTIN 100 MG PO CAPS
100.0000 mg | ORAL_CAPSULE | Freq: Every day | ORAL | Status: DC
  Administered 2013-05-25 – 2013-05-26 (×2): 100 mg via ORAL
  Filled 2013-05-25 (×3): qty 1

## 2013-05-25 MED ORDER — ACETAMINOPHEN 650 MG RE SUPP: 325.0000 mg | RECTAL | Status: DC | PRN

## 2013-05-25 MED ORDER — PROPOFOL 10 MG/ML IV BOLUS
INTRAVENOUS | Status: DC | PRN
  Administered 2013-05-25: 80 mg via INTRAVENOUS

## 2013-05-25 MED ORDER — DEXTROSE 5 % IV SOLN
1.5000 g | Freq: Two times a day (BID) | INTRAVENOUS | Status: AC
  Administered 2013-05-26 (×2): 1.5 g via INTRAVENOUS
  Filled 2013-05-25 (×3): qty 1.5

## 2013-05-25 MED ORDER — ACETAMINOPHEN 325 MG PO TABS: 325.0000 mg | ORAL_TABLET | ORAL | Status: DC | PRN

## 2013-05-25 MED ORDER — HYDROMORPHONE HCL PF 1 MG/ML IJ SOLN
INTRAMUSCULAR | Status: AC
  Administered 2013-05-25: 0.5 mg via INTRAVENOUS
  Filled 2013-05-25: qty 1

## 2013-05-25 MED ORDER — MAGNESIUM SULFATE 40 MG/ML IJ SOLN
2.0000 g | Freq: Every day | INTRAMUSCULAR | Status: DC | PRN
  Filled 2013-05-25: qty 50

## 2013-05-25 MED ORDER — LIDOCAINE HCL (CARDIAC) 20 MG/ML IV SOLN
INTRAVENOUS | Status: DC | PRN
  Administered 2013-05-25: 20 mg via INTRAVENOUS

## 2013-05-25 MED ORDER — PANTOPRAZOLE SODIUM 40 MG PO TBEC
40.0000 mg | DELAYED_RELEASE_TABLET | Freq: Every day | ORAL | Status: DC
  Administered 2013-05-25 – 2013-05-27 (×3): 40 mg via ORAL
  Filled 2013-05-25 (×3): qty 1

## 2013-05-25 MED ORDER — FENTANYL CITRATE 0.05 MG/ML IJ SOLN
INTRAMUSCULAR | Status: DC | PRN
  Administered 2013-05-25 (×6): 25 ug via INTRAVENOUS

## 2013-05-25 MED ORDER — GUAIFENESIN-DM 100-10 MG/5ML PO SYRP: 15.0000 mL | ORAL_SOLUTION | ORAL | Status: DC | PRN

## 2013-05-25 MED ORDER — DOCUSATE SODIUM 100 MG PO CAPS
100.0000 mg | ORAL_CAPSULE | Freq: Every day | ORAL | Status: DC
  Administered 2013-05-26 – 2013-05-27 (×2): 100 mg via ORAL
  Filled 2013-05-25 (×2): qty 1

## 2013-05-25 MED ORDER — PHENYLEPHRINE HCL 10 MG/ML IJ SOLN
INTRAMUSCULAR | Status: DC | PRN
  Administered 2013-05-25: 40 ug via INTRAVENOUS

## 2013-05-25 MED ORDER — LACTATED RINGERS IV SOLN
INTRAVENOUS | Status: DC
  Administered 2013-05-25: 15:00:00 via INTRAVENOUS

## 2013-05-25 MED ORDER — FENTANYL CITRATE 0.05 MG/ML IJ SOLN
25.0000 ug | INTRAMUSCULAR | Status: DC | PRN
  Administered 2013-05-25 (×3): 50 ug via INTRAVENOUS

## 2013-05-25 MED ORDER — 0.9 % SODIUM CHLORIDE (POUR BTL) OPTIME
TOPICAL | Status: DC | PRN
  Administered 2013-05-25: 1000 mL

## 2013-05-25 MED ORDER — BOOST PLUS PO LIQD
237.0000 mL | Freq: Three times a day (TID) | ORAL | Status: DC
  Administered 2013-05-26 (×2): 237 mL via ORAL
  Filled 2013-05-25 (×7): qty 237

## 2013-05-25 MED ORDER — PROMETHAZINE HCL 25 MG/ML IJ SOLN: 6.2500 mg | INTRAMUSCULAR | Status: DC | PRN

## 2013-05-25 MED ORDER — FENTANYL CITRATE 0.05 MG/ML IJ SOLN
INTRAMUSCULAR | Status: AC
  Filled 2013-05-25: qty 5

## 2013-05-25 MED ORDER — PHENYLEPHRINE 40 MCG/ML (10ML) SYRINGE FOR IV PUSH (FOR BLOOD PRESSURE SUPPORT)
PREFILLED_SYRINGE | INTRAVENOUS | Status: AC
  Filled 2013-05-25: qty 10

## 2013-05-25 SURGICAL SUPPLY — 53 items
BANDAGE ELASTIC 4 VELCRO ST LF (GAUZE/BANDAGES/DRESSINGS) ×3 IMPLANT
BANDAGE ELASTIC 6 VELCRO ST LF (GAUZE/BANDAGES/DRESSINGS) IMPLANT
BANDAGE ESMARK 6X9 LF (GAUZE/BANDAGES/DRESSINGS) IMPLANT
BANDAGE GAUZE ELAST BULKY 4 IN (GAUZE/BANDAGES/DRESSINGS) IMPLANT
BNDG ESMARK 6X9 LF (GAUZE/BANDAGES/DRESSINGS)
BNDG GAUZE ELAST 4 BULKY (GAUZE/BANDAGES/DRESSINGS) ×3 IMPLANT
CANISTER SUCTION 2500CC (MISCELLANEOUS) ×3 IMPLANT
CLIP LIGATING EXTRA MED SLVR (CLIP) ×6 IMPLANT
CLIP LIGATING EXTRA SM BLUE (MISCELLANEOUS) ×6 IMPLANT
COVER SURGICAL LIGHT HANDLE (MISCELLANEOUS) ×3 IMPLANT
CUFF TOURNIQUET SINGLE 34IN LL (TOURNIQUET CUFF) IMPLANT
CUFF TOURNIQUET SINGLE 44IN (TOURNIQUET CUFF) IMPLANT
DRAIN SNY 10X20 3/4 PERF (WOUND CARE) IMPLANT
DRAPE ORTHO SPLIT 77X108 STRL (DRAPES) ×4
DRAPE PROXIMA HALF (DRAPES) ×3 IMPLANT
DRAPE SURG ORHT 6 SPLT 77X108 (DRAPES) ×2 IMPLANT
DRSG ADAPTIC 3X8 NADH LF (GAUZE/BANDAGES/DRESSINGS) ×3 IMPLANT
ELECT REM PT RETURN 9FT ADLT (ELECTROSURGICAL) ×3
ELECTRODE REM PT RTRN 9FT ADLT (ELECTROSURGICAL) ×1 IMPLANT
EVACUATOR SILICONE 100CC (DRAIN) IMPLANT
GAUZE XEROFORM 5X9 LF (GAUZE/BANDAGES/DRESSINGS) ×3 IMPLANT
GLOVE BIO SURGEON STRL SZ 6.5 (GLOVE) ×4 IMPLANT
GLOVE BIO SURGEONS STRL SZ 6.5 (GLOVE) ×2
GLOVE BIOGEL PI IND STRL 6.5 (GLOVE) ×4 IMPLANT
GLOVE BIOGEL PI IND STRL 7.0 (GLOVE) ×1 IMPLANT
GLOVE BIOGEL PI INDICATOR 6.5 (GLOVE) ×8
GLOVE BIOGEL PI INDICATOR 7.0 (GLOVE) ×2
GLOVE SS BIOGEL STRL SZ 7.5 (GLOVE) ×1 IMPLANT
GLOVE SUPERSENSE BIOGEL SZ 7.5 (GLOVE) ×2
GLOVE SURG SS PI 6.5 STRL IVOR (GLOVE) ×3 IMPLANT
GOWN STRL REUS W/ TWL LRG LVL3 (GOWN DISPOSABLE) ×4 IMPLANT
GOWN STRL REUS W/ TWL XL LVL3 (GOWN DISPOSABLE) ×1 IMPLANT
GOWN STRL REUS W/TWL LRG LVL3 (GOWN DISPOSABLE) ×8
GOWN STRL REUS W/TWL XL LVL3 (GOWN DISPOSABLE) ×2
KIT BASIN OR (CUSTOM PROCEDURE TRAY) ×3 IMPLANT
KIT ROOM TURNOVER OR (KITS) ×3 IMPLANT
NS IRRIG 1000ML POUR BTL (IV SOLUTION) ×3 IMPLANT
PACK GENERAL/GYN (CUSTOM PROCEDURE TRAY) ×3 IMPLANT
PAD ARMBOARD 7.5X6 YLW CONV (MISCELLANEOUS) ×6 IMPLANT
PADDING CAST COTTON 6X4 STRL (CAST SUPPLIES) IMPLANT
SAW GIGLI STERILE 20 (MISCELLANEOUS) ×3 IMPLANT
SPONGE GAUZE 4X4 12PLY (GAUZE/BANDAGES/DRESSINGS) ×3 IMPLANT
SPONGE GAUZE 4X4 12PLY STER LF (GAUZE/BANDAGES/DRESSINGS) ×3 IMPLANT
STAPLER VISISTAT 35W (STAPLE) ×3 IMPLANT
STOCKINETTE IMPERVIOUS LG (DRAPES) ×3 IMPLANT
SUT ETHILON 3 0 PS 1 (SUTURE) IMPLANT
SUT VIC AB 0 CT1 18XCR BRD 8 (SUTURE) ×2 IMPLANT
SUT VIC AB 0 CT1 8-18 (SUTURE) ×6
SUT VICRYL AB 2 0 TIES (SUTURE) ×3 IMPLANT
TOWEL OR 17X24 6PK STRL BLUE (TOWEL DISPOSABLE) ×3 IMPLANT
TOWEL OR 17X26 10 PK STRL BLUE (TOWEL DISPOSABLE) ×3 IMPLANT
UNDERPAD 30X30 INCONTINENT (UNDERPADS AND DIAPERS) ×3 IMPLANT
WATER STERILE IRR 1000ML POUR (IV SOLUTION) ×3 IMPLANT

## 2013-05-25 NOTE — Progress Notes (Signed)
Pt. In severe pain, restless, begging for help despite multiple doses of fentanyl.  Dr. Glennon Mac notified. Hydromorphone ordered and given.

## 2013-05-25 NOTE — Transfer of Care (Signed)
Immediate Anesthesia Transfer of Care Note  Patient: Regina Hamilton  Procedure(s) Performed: Procedure(s): AMPUTATION BELOW KNEE (Right)  Patient Location: PACU  Anesthesia Type:General  Level of Consciousness: awake, alert  and oriented  Airway & Oxygen Therapy: Patient Spontanous Breathing and Patient connected to nasal cannula oxygen  Post-op Assessment: Report given to PACU RN and Post -op Vital signs reviewed and stable  Post vital signs: Reviewed and stable  Complications: No apparent anesthesia complications

## 2013-05-25 NOTE — Progress Notes (Signed)
Pt daughter called RN to notify that stump was bleeding.  RN to assess.  Small amount of pinkish drainage on pillow.  Stump reinforced with abd pan and Kurlex.  Will continue to monitor closely.  Call bell within reach and daughter at bedside.

## 2013-05-25 NOTE — Progress Notes (Signed)
[facility] PROGRESS NOTE  Regina Hamilton UYQ:[id number] DOB: [birth year] DOA: 05/24/2013 PCP: Walker Kehr, MD  Assessment/Plan: 1. Critical limb ischemia -Has history of severe PVD, seen by vascular surgery in the outpt clinic -Vascular Surgery consulted -Will be taken to OR for BKA of right leg -IV narcotics for pain management  2. Hypertension -Continue Lisinopril 10mg  PO daily and Norvasc 10 mg PO q daily -Systolic blood pressures this am in the 120's  4. History of Afib -Presently in NSR -S/P pacemaker implant  3. Protein Calorie malnutrition -Boost TID     Code Status: Full Family Communication: Spoke with patient's daughter at bedside Disposition Plan: [place]   Consultants:  Vascular Surgery  Procedures:  Plan for BKA on 05/25/13  Antibiotics:  None  HPI/Subjective: Patient is a 80 year old female with a past medical history of peripheral vascular disease, atrial fibrillation, hypertension, recently treated for [facility] completing 10 days of empiric antibiotic therapy, admitted to the medicine service on 05/24/2013 with complaints of severe right foot pain. She was diagnosed with critical lower limb ischemia. Patient seen and evaluated by vascular surgery, plan for right below the knee amputation. She complains of ongoing severe right foot pain  Objective: Filed Vitals:   05/25/13 1300  BP: 162/37  Pulse: 73  Temp: 97.7 F (36.5 C)  Resp: 18   No intake or output data in the 24 hours ending 05/25/13 1457 Filed Weights   05/24/13 1100  Weight: 50.349 kg (111 lb)    Exam:   General:  Patient complaining of right foot pain  Cardiovascular: Regular rate rhythm, normal S1-S2  Respiratory: Clear to auscultation  Abdomen: Soft nontender nondistended  Musculoskeletal: Rt foot cool to touch, absent pulses, erythema present  Data Reviewed: Basic Metabolic Panel:  Recent Labs Lab 05/25/13 0258  NA 138  K 4.7  CL 103   CO2 24  GLUCOSE 87  BUN 20  CREATININE 0.90  CALCIUM 9.1   Liver Function Tests: No results found for this basename: AST, ALT, ALKPHOS, BILITOT, PROT, ALBUMIN,  in the last 168 hours No results found for this basename: LIPASE, AMYLASE,  in the last 168 hours No results found for this basename: AMMONIA,  in the last 168 hours CBC:  Recent Labs Lab 05/25/13 0258  WBC 6.6  HGB 10.6*  HCT 31.8*  MCV 86.4  PLT 255   Cardiac Enzymes: No results found for this basename: CKTOTAL, CKMB, CKMBINDEX, TROPONINI,  in the last 168 hours BNP (last 3 results)  Recent Labs  09/02/12 1259 09/03/12 0412 09/04/12 0435  PROBNP 2164.0* 1834.0* 1066.0*   CBG: No results found for this basename: GLUCAP,  in the last 168 hours  Recent Results (from the past 240 hour(s))  SURGICAL PCR SCREEN     Status: None   Collection Time    05/25/13 10:21 AM      Result Value Ref Range Status   MRSA, PCR NEGATIVE  NEGATIVE Final   Staphylococcus aureus NEGATIVE  NEGATIVE Final   Comment:            The Xpert SA Assay (FDA     approved for NASAL specimens     in patients over 73 years of age),     is one component of     a comprehensive surveillance     program.  Test performance has     been validated by [employer] for patients greater     than  or equal to 47 year old.     It is not intended     to diagnose infection nor to     guide or monitor treatment.     Studies: No results found.  Scheduled Meds: . [MAR HOLD] aliskiren  150 mg Oral QHS  . [MAR HOLD] amLODipine  10 mg Oral Daily  . [facility] HOLD] aspirin EC  81 mg Oral Daily  . [MAR HOLD] cefUROXime (ZINACEF)  IV  1.5 g Intravenous On Call to OR  . [facility] HOLD] cholecalciferol  4,000 Units Oral Daily  . [MAR HOLD] dorzolamide  1 drop Left Eye BID  . [MAR HOLD] enoxaparin (LOVENOX) injection  30 mg Subcutaneous Q24H  . [facility] HOLD] fentaNYL  25 mcg Transdermal Q72H  . [MAR HOLD] lisinopril  10 mg Oral BID  . [MAR HOLD] loteprednol   1 drop Both Eyes Daily  . [MAR HOLD] multivitamin with minerals  1 tablet Oral Daily  . [MAR HOLD] polyethylene glycol  17 g Oral Daily  . [facility] HOLD] pyridOXINE  100 mg Oral Daily  . [MAR HOLD] sodium chloride  3 mL Intravenous Q12H  . [facility] HOLD] vitamin C  500 mg Oral Daily   Continuous Infusions: . sodium chloride 50 mL/hr at 05/25/13 1016  . lactated ringers 50 mL/hr at 05/25/13 1433    Principal Problem:   Critical lower limb ischemia Active Problems:   HYPERTENSION   Atrial fibrillation   Other malaise and fatigue   UNSPECIFIED PERIPHERAL VASCULAR DISEASE   Chronic diastolic heart failure   Peripheral neuropathy   Chronic ulcer of right foot   Pain in limb-Right Thigh/Leg   Severe protein-calorie malnutrition    Time spent: 35 min    [facility] Pager [phone]. If 7PM-7AM, please contact night-coverage at [email or web address], password [facility] 05/25/2013, 2:57 PM  LOS: 1 day

## 2013-05-25 NOTE — Anesthesia Postprocedure Evaluation (Signed)
  Anesthesia Post-op Note  Patient: Regina Hamilton  Procedure(s) Performed: Procedure(s): AMPUTATION BELOW KNEE (Right)  Patient Location: PACU  Anesthesia Type:General  Level of Consciousness: awake  Airway and Oxygen Therapy: Patient Spontanous Breathing  Post-op Pain: mild  Post-op Assessment: Post-op Vital signs reviewed, Patient's Cardiovascular Status Stable, Respiratory Function Stable, Patent Airway, No signs of Nausea or vomiting and Pain level controlled  Post-op Vital Signs: Reviewed and stable  Last Vitals:  Filed Vitals:   05/25/13 1835  BP: 175/53  Pulse: 71  Temp:   Resp: 22    Complications: No apparent anesthesia complications

## 2013-05-25 NOTE — Progress Notes (Signed)
INITIAL NUTRITION ASSESSMENT  DOCUMENTATION CODES Per approved criteria  -Severe malnutrition in the context of acute illness or injury   INTERVENTION: Advance diet as medically appropriate, add interventions accordingly RD to follow for nutrition care plan  NUTRITION DIAGNOSIS: Increased nutrient needs related to post-op healing as evidenced by estimated nutrition needs  Goal: Pt to meet >/= 90% of their estimated nutrition needs   Monitor:  PO diet advancement & intake, weight, labs, I/O's  Reason for Assessment: Consult, Malnutrition Screening Tool Report  80 y.o. female  Admitting Dx: Critical lower limb ischemia  ASSESSMENT: 80 year old female with history of peripheral vascular disease, A. Fib, hypertension and history of pacemaker plac who was recently hospitalized at [facility] for cellulitis of her right foot and completed 10 days of antibiotics was seen by her PCP and wound care clinic for worsening pain over her right foot .   She presented to the wound care center and was referred to vascular surgery clinic today. Patient was found to have ischemic changes over the right foot with drainage from her second and third toenails and ulcers over the lateral aspect of the right foot; plan for BKA.  RD spoke with patient's daughter at bedside; reports for the past 3 weeks pt's appetite has drastically decreased; pt has been in a lot of pain; daughter reports pt was consuming < 500 kcals per day; pt with visible severe muscle & fat loss to upper body; pt with 5% weight loss in 1.5 months (not significant for time frame); does not care for Ensure supplements, however, liked Resource Breeze while she was hospitalized at [facility]; will order when/as able.  Nutrition Focused Physical Exam:  Subcutaneous Fat:  Orbital Region: N/A Upper Arm Region: severe depletion Thoracic and Lumbar Region: N/A  Muscle:  Temple Region: mild to moderate depletion Clavicle Bone Region: severe depletion   Clavicle and Acromion Bone Region: severe depletion Scapular Bone Region: severe depletion (per pt's daughter) Dorsal Hand: N/A Patellar Region: N/A Anterior Thigh Region: N/A Posterior Calf Region: N/A  Edema: none  Patient meets criteria for severe malnutrition in the context of acute illness or injury as evidenced by < 50% intake of estimated energy requirement for > 5 days and severe muscle & subcutaneous fat loss.  Height: Ht Readings from Last 1 Encounters:  05/24/13 5\' 3"  (1.6 m)    Weight: Wt Readings from Last 1 Encounters:  05/24/13 111 lb (50.349 kg)    Ideal Body Weight: 115 lb  % Ideal Body Weight: 96%  Wt Readings from Last 10 Encounters:  05/24/13 111 lb (50.349 kg)  05/24/13 111 lb (50.349 kg)  05/24/13 111 lb (50.349 kg)  05/18/13 111 lb (50.349 kg)  05/08/13 116 lb 6.5 oz (52.8 kg)  04/28/13 111 lb (50.349 kg)  04/04/13 117 lb (53.071 kg)  04/04/13 117 lb (53.071 kg)  03/30/13 117 lb (53.071 kg)  03/24/13 116 lb 6.4 oz (52.799 kg)    Usual Body Weight: 117 lb -- February 2015  % Usual Body Weight: 95%  BMI:  Body mass index is 19.67 kg/(m^2).  Estimated Nutritional Needs: Kcal: 1200-1400 Protein: 60-70 gm Fluid: >/= 1.5 L  Skin: foot ulcer  Diet Order: NPO  EDUCATION NEEDS: -No education needs identified at this time  Labs:   Recent Labs Lab 05/25/13 0258  NA 138  K 4.7  CL 103  CO2 24  BUN 20  CREATININE 0.90  CALCIUM 9.1  GLUCOSE 87    Scheduled Meds: . aliskiren  150 mg Oral QHS  . amLODipine  10 mg Oral Daily  . aspirin EC  81 mg Oral Daily  . cefUROXime (ZINACEF)  IV  1.5 g Intravenous On Call to OR  . cholecalciferol  4,000 Units Oral Daily  . dorzolamide  1 drop Left Eye BID  . enoxaparin (LOVENOX) injection  30 mg Subcutaneous Q24H  . fentaNYL  25 mcg Transdermal Q72H  . lisinopril  10 mg Oral BID  . loteprednol  1 drop Both Eyes Daily  . multivitamin with minerals  1 tablet Oral Daily  . polyethylene  glycol  17 g Oral Daily  . pyridOXINE  100 mg Oral Daily  . sodium chloride  3 mL Intravenous Q12H  . vitamin C  500 mg Oral Daily    Continuous Infusions: . sodium chloride 50 mL/hr at 05/25/13 1016    Past Medical History  Diagnosis Date  . Peripheral vascular disease   . Carotid artery occlusion   . Hypertension   . Hyperlipidemia   . Arthritis   . Pacemaker 2008    [facility] in [place], [place]  . Complication of anesthesia   . Dysrhythmia     atrial fibrilation  . Anxiety   . Chronic venous insufficiency     B  . Atrioventricular block, complete s/p AV junction ablation 11/19/2012  . Neuromuscular disorder     periphreal neuroptahty    Past Surgical History  Procedure Laterality Date  . Spine surgery  1970    Cervical fusion  . Cardio ablation- pacemaker    . Pr vein bypass graft,aorto-fem-pop    . Total hip revision      bilateral  . Cholecystectomy  05/20/11  . Angioplasty  03/01/2012  . Insert / replace / remove pacemaker    . Corneal transplant Bilateral   . Back surgery    . Abdominal aortagram  Feb. 16, 2015    Arthur Holms, RD, LDN Pager #: [phone] After-Hours Pager #: [phone]

## 2013-05-25 NOTE — Op Note (Signed)
    OPERATIVE REPORT  DATE OF SURGERY: 05/25/2013  PATIENT: Regina Hamilton, 80 y.o. female MRN: [id number]  DOB: 1923-03-02  PRE-OPERATIVE DIAGNOSIS: Rest pain and ischemic ulcers right foot  POST-OPERATIVE DIAGNOSIS:  Same  PROCEDURE: Right below-knee amputation  SURGEON:  Curt Jews, M.D.  PHYSICIAN ASSISTANT: Samantha Rhyne PA-C  ANESTHESIA:  Gen.  EBL: 100 ml  Total I/O In: 800 [I.V.:800] Out: 250 [Blood:250]  BLOOD ADMINISTERED: None  DRAINS: None  SPECIMEN: BKA  COUNTS CORRECT:  YES  PLAN OF CARE: PACU   PATIENT DISPOSITION:  PACU - hemodynamically stable  PROCEDURE DETAILS: The patient was taken to the operating room placed supine position where the area of right leg was prepped and draped in the usual sterile fashion. Using a posterior based muscle flap an incision was made several fingerbreadths below the tibial prominence and carried down to the anterior tibial muscle bodies. The anterior tibial neurovascular bundle was ligated with 0 Vicryl ties and divided. The gastrocnemius muscle was left intact with the posterior based skin flap. The soleus was then divided in line with the anterior incision. The periosteum was elevated off the fibula and tibia. The fibula was divided with bone shears and the tibia was divided with a Gigli saw. The bone edges were smoothed with a bone rasp. The wound irrigated with saline and hemostasis obtained with cautery. The posterior base flap was then brought up to the anterior flap and and that was closed with interrupted 0 Vicryl figure-of-eight sutures. Skin was closed with skin clips. Sterile dressing was applied and the patient taken to the recovery in stable condition   Curt Jews, M.D. 05/25/2013 4:12 PM

## 2013-05-25 NOTE — Progress Notes (Signed)
Pt continues to be in pain 10/10, sitting up in bed, begging for help.  Dilaudid given for pain.

## 2013-05-25 NOTE — Progress Notes (Signed)
Chaplain gave support to pt and pt's daughter by a compassionate presence-caring conversation and empathic listening.  Pt spoke openly of her faith and her assurance that God's will will be done.   05/25/13 1000  Clinical Encounter Type  Visited With Patient and family together  Visit Type Spiritual support;Pre-op  Referral From [place]   Estelle June, chaplain [phone]

## 2013-05-25 NOTE — Care Management Note (Signed)
    Page 1 of 1   05/27/2013     4:44:50 PM   CARE MANAGEMENT NOTE 05/27/2013  Patient:  Regina Hamilton, Regina Hamilton   Account Number:  [id number]  Date Initiated:  05/25/2013  Documentation initiated by:  Tabitha Tupper  Subjective/Objective Assessment:   PT ADM ON 4/7 WITH PVD/RT LEG ISCHEMIA.  TO OR FOR RT BKA TODAY.  PTA, PT RESIDES AT [place].     Action/Plan:   PT/DAUGHTER HOPEFUL FOR CIR ADMISSION AT DC; WILL HAVE CSW EVAULATE FOR SNF AS BACKUP PLAN SHOULD CIR ADMISSION NOT WORK OUT.  WILL FOLLOW PROGRESS.   Anticipated DC Date:  05/27/2013   Anticipated DC Plan:  SKILLED NURSING FACILITY  In-house referral  Clinical Social Worker      DC Planning Services  CM consult      Choice offered to / List presented to:             Status of service:  Completed, signed off Medicare Important Message given?   (If response is "NO", the following Medicare IM given date fields will be blank) Date Medicare IM given:   Date Additional Medicare IM given:    Discharge Disposition:  [place]  Per UR Regulation:  Reviewed for med. necessity/level of care/duration of stay  If discussed at Organ of Stay Meetings, dates discussed:    Comments:  05/27/13 Govanni Plemons,RN,BSN [phone] PT DISCHARGED TO SNF TODAY, PER CSW ARRANGEMENTS.   PT'S DAUGHTERS SOMEWHAT UPSET THAT PT BEING DC'D TODAY; DISCUSSED CONCERNS WITH THEM WITH ATTENDING MD PRESENT. THEY EVENTUALLY STATED THAT THEY UNDERSTAND AFTER MULT QUESTIONS ANSWERED, AND AGREE TO DC TO SNF TODAY, AS ORDERED BY MD.  05/26/13 Doss Cybulski,RN,BSN [phone] PT'S FAMILY GIVEN [place]-PREPARED LIST OF PRIVATE DUTY SITTERS/HH AIDES, PER THEIR REQUEST.  THEY PLAN TO HIRE SITTER FOR NIGHTTIME.

## 2013-05-25 NOTE — Progress Notes (Signed)
Pt arrived to pacu from OR on bed. Pt. C/o pain.  Fentanyl given.

## 2013-05-25 NOTE — Anesthesia Preprocedure Evaluation (Addendum)
Anesthesia Evaluation  Patient identified by MRN, date of birth, ID band Patient awake    Reviewed: Allergy & Precautions, H&P , NPO status , Patient's Chart, lab work & pertinent test results  History of Anesthesia Complications (+) Emergence Delirium  Airway Mallampati: II TM Distance: >3 FB Neck ROM: Full    Dental  (+) Chipped, Missing, Dental Advisory Given   Pulmonary shortness of breath,  breath sounds clear to auscultation        Cardiovascular hypertension, Pt. on medications - angina+ Peripheral Vascular Disease (plavix) + dysrhythmias Atrial Fibrillation + pacemaker (for third degree AVB) Rhythm:Irregular Rate:Normal  '14 ECHO: EF 60%, valves OK   Neuro/Psych    GI/Hepatic negative GI ROS, Neg liver ROS,   Endo/Other  negative endocrine ROS  Renal/GU negative Renal ROS     Musculoskeletal   Abdominal   Peds  Hematology  (+) Blood dyscrasia (Hb 10.6), anemia ,   Anesthesia Other Findings   Reproductive/Obstetrics                         Anesthesia Physical Anesthesia Plan  ASA: III  Anesthesia Plan: General   Post-op Pain Management:    Induction: Intravenous  Airway Management Planned: LMA  Additional Equipment:   Intra-op Plan:   Post-operative Plan: Extubation in OR  Informed Consent: I have reviewed the patients History and Physical, chart, labs and discussed the procedure including the risks, benefits and alternatives for the proposed anesthesia with the patient or authorized representative who has indicated his/her understanding and acceptance.   Dental advisory given  Plan Discussed with: CRNA and Surgeon  Anesthesia Plan Comments: (Plan routine monitors, GA- LMA OK)        Anesthesia Quick Evaluation

## 2013-05-25 NOTE — H&P (View-Only) (Signed)
Established Critical Limb Ischemia Patient  History of Present Illness  Regina Hamilton is a 80 y.o. (06/15/1923) female patient of Dr. Scot Dock has had previous angioplasty of a small superficial femoral artery which subsequently occluded. Since Dr. Scot Dock saw her last, she developed a wound on the lateral aspect of her right foot and also on the tips of her right second and third toes. She's also had some erythema. She describes rest pain in the right foot. Her activity is fairly limited.  Because of her age and somewhat fragile state, Dr. Scot Dock notes that she is not a good candidate for extensive revascularization.  Dr. Scot Dock notes that he reviewed her records from the [facility] primary care. She was started on [place] but apparently had a reaction to this and the family stopped this. She presents today referred by Dr. Jerline Pain from [facility] wound care clinic for severe pain and ischemia in left foot. Her right foot wounds are not improving. She is using Fentanyl patch, Dilaudid tablet, Norco, and still having intractable pain that keeps her awake at night.  Daughters state they cannot take her home today due to unrelenting pain.   Daughters state that their mother had an impaction, that they manually disimpacted her. Patient is walking with a cane sometimes. Daughters states that patient lost about 20 pounds in the last couple of months since she is not eating or drinking much due to the pain in her right foot.  04/03/2013 aortogram by Dr. Scot Dock: 1. The infrarenal aorta is widely patent as are both common ilac arteries, hypogastric arteries, and external iliac arteries.  2. On the right side, which is the symptomatic side, the common femoral, deep femoral, and superficial femoral artery are patent. The superficial femoral artery has mild disease and is somewhat small but is patent to the adductor canal. There is occlusion of the distal superficial femoral artery and the right and the above-knee  popliteal artery. There is reconstitution of the popliteal artery at the level of the knee which is occluded distally. The anterior tibial and posterior tibial arteries are occluded. The proximal peroneal was occluded with reconstitution of a severely diseased distal peroneal artery on the right.  3. On the left side, the common femoral and deep femoral artery are patent. The superficial femoral artery is occluded. There is a left femoral to above-knee popliteal artery bypass graft which is widely patent with no areas of stenosis within the graft. The pocket arteries patent focal tight stenosis at the level of the knee. The anterior tibial and posterior tibial arteries are occluded and a single vessel runoff on the left via the peroneal artery with some moderate diffuse disease of the proximal peroneal artery.   The patient's PMH, PSH, SH, FamHx, Med, and Allergies are unchanged from 03/30/2013.  On ROS today: see HPI for pertinent positives and negatives.  Physical Examination  Filed Vitals:   05/24/13 0946  BP: 138/64  Pulse: 69  Resp: 16   Filed Weights   05/24/13 0946  Weight: 111 lb (50.349 kg)   Body mass index is 19.67 kg/(m^2).  General: A&O x 3, WDWN, Ill appearing.  Eyes: PERRLA.  Pulmonary: Sym exp, good air movt, CTAB, no rales, rhonchi, or wheezing.  Cardiac: RRR, Nl S1, S2, no Murmurs.  Vascular: Vessel Right Left  Radial 2+Palpable 2+Palpable  Carotid  without bruit  without bruit  Aorta Not palpable N/A  Femoral 3+Palpable 3+Palpable  Popliteal Not palpable Not palpable  PT Not Palpable  Not Palpable  DP Not Palpable Not Palpable   Gastrointestinal: soft, NTND, -G/R.  Musculoskeletal: M/S 4/5 throughout, Extremities without ischemic changes except right foot with serous drainage from around toenails 2 and 3, and ulcer on right foot, lateral aspect base of metatarsus.  Neurologic: Pain in right foot to light touch, Motor exam as listed above  Medical  Decision Making  Regina Hamilton is a 80 y.o. female who presents with: right critical foot ischemia with non healing wounds and intractable pain. Dr. Donnetta Hutching spoke with patient and her two daughters who are both RN's, and he examined patient. He also reviewed her aortogram images from 04/03/2013. Scheduled with Dr. Donnetta Hutching for right below the knee amputation tomorrow.  Thank you for allowing [place] to participate in this patient's care.  Clemon Chambers, RN, MSN, FNP-C [facility] Office: [phone]  Clinic MD: Early  05/24/2013, 9:28 AM

## 2013-05-25 NOTE — Anesthesia Procedure Notes (Signed)
Procedure Name: LMA Insertion Date/Time: 05/25/2013 3:08 PM Performed by: [place] Pink Pre-anesthesia Checklist: Patient identified, Timeout performed, Emergency Drugs available, Suction available and Patient being monitored Patient Re-evaluated:Patient Re-evaluated prior to inductionOxygen Delivery Method: Circle system utilized Preoxygenation: Pre-oxygenation with 100% oxygen Intubation Type: IV induction LMA: LMA inserted LMA Size: 4.0 Number of attempts: 2 Placement Confirmation: positive ETCO2 and breath sounds checked- equal and bilateral Tube secured with: Tape Dental Injury: Teeth and Oropharynx as per pre-operative assessment

## 2013-05-25 NOTE — Interval H&P Note (Signed)
History and Physical Interval Note:  05/25/2013 2:22 PM  Regina Hamilton  has presented today for surgery, with the diagnosis of critical Lower limb ischemia  The various methods of treatment have been discussed with the patient and family. After consideration of risks, benefits and other options for treatment, the patient has consented to  Procedure(s): AMPUTATION BELOW KNEE (Right) as a surgical intervention .  The patient's history has been reviewed, patient examined, no change in status, stable for surgery.  I have reviewed the patient's chart and labs.  Questions were answered to the patient's satisfaction.     Regina Hamilton

## 2013-05-26 ENCOUNTER — Encounter (HOSPITAL_COMMUNITY): Payer: Self-pay | Admitting: Vascular Surgery

## 2013-05-26 DIAGNOSIS — I872 Venous insufficiency (chronic) (peripheral): Secondary | ICD-10-CM

## 2013-05-26 DIAGNOSIS — I998 Other disorder of circulatory system: Secondary | ICD-10-CM

## 2013-05-26 DIAGNOSIS — E43 Unspecified severe protein-calorie malnutrition: Secondary | ICD-10-CM

## 2013-05-26 DIAGNOSIS — S88119A Complete traumatic amputation at level between knee and ankle, unspecified lower leg, initial encounter: Secondary | ICD-10-CM

## 2013-05-26 DIAGNOSIS — L98499 Non-pressure chronic ulcer of skin of other sites with unspecified severity: Secondary | ICD-10-CM

## 2013-05-26 DIAGNOSIS — T8789 Other complications of amputation stump: Secondary | ICD-10-CM

## 2013-05-26 DIAGNOSIS — E46 Unspecified protein-calorie malnutrition: Secondary | ICD-10-CM

## 2013-05-26 DIAGNOSIS — I739 Peripheral vascular disease, unspecified: Secondary | ICD-10-CM

## 2013-05-26 DIAGNOSIS — I10 Essential (primary) hypertension: Secondary | ICD-10-CM

## 2013-05-26 HISTORY — DX: Other complications of amputation stump: T87.89

## 2013-05-26 HISTORY — DX: Other disorder of circulatory system: I99.8

## 2013-05-26 LAB — CBC
HEMATOCRIT: 28.1 % — AB (ref 36.0–46.0)
Hemoglobin: 9.6 g/dL — ABNORMAL LOW (ref 12.0–15.0)
MCH: 29.6 pg (ref 26.0–34.0)
MCHC: 34.2 g/dL (ref 30.0–36.0)
MCV: 86.7 fL (ref 78.0–100.0)
Platelets: 222 10*3/uL (ref 150–400)
RBC: 3.24 MIL/uL — AB (ref 3.87–5.11)
RDW: 16.1 % — ABNORMAL HIGH (ref 11.5–15.5)
WBC: 12.3 10*3/uL — AB (ref 4.0–10.5)

## 2013-05-26 LAB — BASIC METABOLIC PANEL
BUN: 13 mg/dL (ref 6–23)
CHLORIDE: 104 meq/L (ref 96–112)
CO2: 22 mEq/L (ref 19–32)
CREATININE: 0.65 mg/dL (ref 0.50–1.10)
Calcium: 8.8 mg/dL (ref 8.4–10.5)
GFR calc Af Amer: 90 mL/min — ABNORMAL LOW (ref >90)
GFR calc non Af Amer: 78 mL/min — ABNORMAL LOW (ref >90)
Glucose, Bld: 116 mg/dL — ABNORMAL HIGH (ref 70–99)
Potassium: 4.1 mEq/L (ref 3.7–5.3)
SODIUM: 140 meq/L (ref 137–147)

## 2013-05-26 MED ORDER — LISINOPRIL 20 MG PO TABS
20.0000 mg | ORAL_TABLET | Freq: Two times a day (BID) | ORAL | Status: DC
  Administered 2013-05-26 – 2013-05-27 (×3): 20 mg via ORAL
  Filled 2013-05-26 (×4): qty 1

## 2013-05-26 MED ORDER — OXYCODONE HCL 5 MG PO TABS
10.0000 mg | ORAL_TABLET | ORAL | Status: DC | PRN
  Administered 2013-05-26 – 2013-05-27 (×5): 10 mg via ORAL
  Filled 2013-05-26 (×5): qty 2

## 2013-05-26 MED ORDER — BOOST / RESOURCE BREEZE PO LIQD
1.0000 | Freq: Three times a day (TID) | ORAL | Status: DC
  Administered 2013-05-26 – 2013-05-27 (×3): 1 via ORAL

## 2013-05-26 MED ORDER — MORPHINE SULFATE 2 MG/ML IJ SOLN
2.0000 mg | INTRAMUSCULAR | Status: DC | PRN
  Administered 2013-05-26 – 2013-05-27 (×3): 2 mg via INTRAVENOUS
  Filled 2013-05-26 (×3): qty 1

## 2013-05-26 NOTE — Evaluation (Signed)
Physical Therapy Evaluation Patient Details Name: Regina Hamilton MRN: [id number] DOB: 02/17/1924 Today's Date: 05/26/2013   History of Present Illness  pt presents with Ischemic R foot, now s/p BKA.    Clinical Impression  Pt very painful in R LE and with IV pain meds becomes a little drowsy and confused.  Pt needs extensive A for all mobility at this time and would benefit from SNF at D/C to maximize independence.  Will continue to follow.      Follow Up Recommendations SNF    Equipment Recommendations  None recommended by PT    Recommendations for Other Services       Precautions / Restrictions Precautions Precautions: Fall Restrictions Weight Bearing Restrictions: No      Mobility  Bed Mobility Overal bed mobility: Needs Assistance Bed Mobility: Supine to Sit     Supine to sit: Max assist     General bed mobility comments: cues for sequencing and A for Bil LEs and trunk.    Transfers Overall transfer level: Needs assistance Equipment used: 2 person hand held assist Transfers: Sit to/from [facility] Sit to Stand: Mod assist;+2 physical assistance Stand pivot transfers: Max assist;+2 physical assistance       General transfer comment: cues for UE use and step-by-step through pivot to chair.    Ambulation/Gait                Stairs            Wheelchair Mobility    Modified Rankin (Stroke Patients Only)       Balance Overall balance assessment: Needs assistance Sitting-balance support: Bilateral upper extremity supported Sitting balance-Leahy Scale: Poor Sitting balance - Comments: pt tends to lean posteriorly.   Postural control: Posterior lean Standing balance support: During functional activity Standing balance-Leahy Scale: Zero                               Pertinent Vitals/Pain 8/10 in R BKA.  Premedicated.      Home Living Family/patient expects to be discharged to:: Skilled nursing facility Living  Arrangements: Alone;Children Available Help at Discharge: Family;Available 24 hours/day Type of Home: House Home Access: Ramped entrance     Home Layout: Two level;Able to live on main level with bedroom/bathroom Home Equipment: Walker - 4 wheels;Walker - 2 wheels;Bedside commode      Prior Function Level of Independence: Independent               Hand Dominance   Dominant Hand: Right    Extremity/Trunk Assessment   Upper Extremity Assessment: Defer to OT evaluation           Lower Extremity Assessment: RLE deficits/detail RLE Deficits / Details: New R BKA, very painful.  Limited ROM 2/2 pain and edema.      Cervical / Trunk Assessment: Normal  Communication   Communication: No difficulties  Cognition Arousal/Alertness: Awake/alert Behavior During Therapy: WFL for tasks assessed/performed Overall Cognitive Status: Within Functional Limits for tasks assessed                      General Comments      Exercises        Assessment/Plan    PT Assessment Patient needs continued PT services  PT Diagnosis Difficulty walking;Acute pain   PT Problem List Decreased strength;Decreased activity tolerance;Decreased balance;Decreased mobility;Decreased knowledge of use of DME;Pain  PT Treatment Interventions DME instruction;Gait  training;Functional mobility training;Therapeutic activities;Therapeutic exercise;Balance training;Patient/family education   PT Goals (Current goals can be found in the Care Plan section) Acute Rehab PT Goals Patient Stated Goal: Exercise PT Goal Formulation: With patient/family Time For Goal Achievement: 06/09/13 Potential to Achieve Goals: Good    Frequency Min 3X/week   Barriers to discharge        Co-evaluation PT/OT/SLP Co-Evaluation/Treatment: Yes Reason for Co-Treatment:  (pt very painful) PT goals addressed during session: Mobility/safety with mobility;Balance         End of Session Equipment Utilized During  Treatment: Gait belt Activity Tolerance: Patient limited by pain Patient left: in chair;with call bell/phone within reach;with family/visitor present Nurse Communication: Mobility status         Time: 6010-9323 PT Time Calculation (min): 27 min   Charges:   PT Evaluation $Initial PT Evaluation Tier I: 1 Procedure PT Treatments $Therapeutic Activity: 8-22 mins   PT G CodesCatarina Hartshorn, [place] [phone] 05/26/2013, 12:14 PM

## 2013-05-26 NOTE — Consult Note (Signed)
Physical Medicine and Rehabilitation Consult Reason for Consult: Right BKA Referring Physician: Dr. Coralyn Pear   HPI: Regina Hamilton is a 80 y.o. right-handed female history of CAD/atrial fibrillation with pacemaker as well as peripheral vascular disease. Presented 05/24/2013 progressive right foot pain recent admission for cellulitis was discharged to home on 10 days of antibiotics. She had been followed at the wound care center. Following recent discharge continued to have pain over the right foot. Patient was found to have ischemic changes with drainage from her second and third toenails. Recent x-ray showed no evidence of osteomyelitis. We have was not felt to be salvageable. Underwent right below-knee amputation 05/25/2013 Dr. Donnetta Hutching. Postoperative pain management. Subcutaneous Lovenox for DVT prophylaxis. Acute blood loss anemia 9.6 and monitored. Physical and occupational therapy evaluations are pending. M.D. has requested physical medicine rehabilitation consult.   Review of Systems  Cardiovascular: Positive for palpitations.  Musculoskeletal: Positive for myalgias.  Neurological: Positive for tingling and weakness.  Psychiatric/Behavioral:       Anxiety  All other systems reviewed and are negative.  Past Medical History  Diagnosis Date  . Peripheral vascular disease   . Carotid artery occlusion   . Hypertension   . Hyperlipidemia   . Arthritis   . Pacemaker 2008    [facility] in [place], [place]  . Complication of anesthesia   . Dysrhythmia     atrial fibrilation  . Anxiety   . Chronic venous insufficiency     B  . Atrioventricular block, complete s/p AV junction ablation 11/19/2012  . Neuromuscular disorder     periphreal neuroptahty   Past Surgical History  Procedure Laterality Date  . Spine surgery  1970    Cervical fusion  . Cardio ablation- pacemaker    . Pr vein bypass graft,aorto-fem-pop    . Total hip revision      bilateral  . Cholecystectomy   05/20/11  . Angioplasty  03/01/2012  . Insert / replace / remove pacemaker    . Corneal transplant Bilateral   . Back surgery    . Abdominal aortagram  Feb. 16, 2015   Family History  Problem Relation Age of Onset  . Heart disease Mother     Heart disease before age 46  . Hypertension Mother   . Varicose Veins Mother   . Peripheral vascular disease Mother   . Other Mother     amputation  . Heart disease Father     Heat Diease before age 8  . Heart disease Maternal Grandmother   . Heart disease Paternal Grandmother   . Diabetes Sister   . Hypertension Sister   . Varicose Veins Sister   . Diabetes Brother   . Hypertension Brother   . Heart attack Brother   . Hypertension Daughter    Social History:  reports that she has never smoked. She has never used smokeless tobacco. She reports that she does not drink alcohol or use illicit drugs. Allergies:  Allergies  Allergen Reactions  . Latanoprost Anaphylaxis    redness  . Pentoxifylline     Severe SOB, mental confusion per daughter  . Versed [Midazolam] Other (See Comments)     hallucinations per daughter  . Warfarin Sodium Rash  . Alphagan P [Brimonidine Tartrate] Photosensitivity    Redness bilateral eye  . Oxycodone Hcl Itching  . Avelox [Moxifloxacin Hcl In Nacl] Other (See Comments)    tongue swelling  . Clonidine Derivatives Other (See Comments)  Pt's daughter states she has severe hallucinations  . Dilaudid [Hydromorphone Hcl] Nausea And Vomiting    IV formulation only  . Diltiazem Hcl Hives  . Doxycycline Nausea And Vomiting  . Hydralazine Itching  . Meloxicam Other (See Comments)    Odd feeling  . Moxifloxacin Other (See Comments)     Tongue swelling; mouth ulcers; confusion  . Contrast Media [Iodinated Diagnostic Agents] Rash    Contrast dye.  . Iron Rash   Medications Prior to Admission  Medication Dose Route Frequency Provider Last Rate Last Dose  . methylPREDNISolone acetate (DEPO-MEDROL) injection  40 mg  40 mg Intra-articular Once Cassandria Anger, MD       Medications Prior to Admission  Medication Sig Dispense Refill  . aliskiren (TEKTURNA) 300 MG tablet Take 150 mg by mouth at bedtime.       . ALPRAZolam (XANAX) 0.25 MG tablet Take 1 tablet (0.25 mg total) by mouth at bedtime as needed for sleep.  30 tablet  5  . amLODipine (NORVASC) 10 MG tablet Take 1 tablet (10 mg total) by mouth daily.  30 tablet  11  . aspirin EC 81 MG tablet Take 81 mg by mouth daily.      . Cholecalciferol (VITAMIN D) 2000 UNITS tablet Take 4,000 Units by mouth daily at 12 noon.       . clopidogrel (PLAVIX) 75 MG tablet Take 75 mg by mouth daily.      Marland Kitchen docusate sodium (COLACE) 100 MG capsule Take 200 mg by mouth daily.       . dorzolamide (TRUSOPT) 2 % ophthalmic solution Place 1 drop into the left eye 2 (two) times daily.  10 mL  11  . fentaNYL (DURAGESIC) 25 MCG/HR patch Place 1 patch (25 mcg total) onto the skin every 3 (three) days.  10 patch  0  . gabapentin (NEURONTIN) 100 MG capsule Take 1 capsule (100 mg total) by mouth at bedtime.  30 capsule  11  . HYDROcodone-acetaminophen (NORCO/VICODIN) 5-325 MG per tablet Take 2 tablets by mouth every 6 (six) hours as needed for moderate pain.  40 tablet  0  . HYDROmorphone (DILAUDID) 4 MG tablet Take 1 tablet (4 mg total) by mouth every 4 (four) hours as needed for severe pain.  120 tablet  0  . lisinopril (PRINIVIL,ZESTRIL) 20 MG tablet Take 10 mg by mouth 2 (two) times daily.      Marland Kitchen loteprednol (LOTEMAX) 0.5 % ophthalmic suspension Place 1 drop into both eyes daily.      . Multiple Vitamin (MULTIVITAMIN WITH MINERALS) TABS tablet Take 1 tablet by mouth daily at 12 noon.       Marland Kitchen POTASSIUM PO Take 1 tablet by mouth daily at 12 noon.       . vitamin C (ASCORBIC ACID) 500 MG tablet Take 500 mg by mouth daily at 12 noon.       . Zinc Sulfate (ZINC 15 PO) Take 15 mg by mouth daily at 12 noon.         Home:    Functional History:   Functional Status:    Mobility:          ADL:    Cognition: Cognition Orientation Level: Oriented X4    Blood pressure 157/47, pulse 70, temperature 99.3 F (37.4 C), temperature source Oral, resp. rate 18, height 5\' 3"  (1.6 m), weight 111 lb (50.349 kg), SpO2 95.00%. Physical Exam  Vitals reviewed. HENT:  Head: Normocephalic.  Eyes: EOM are  normal.  Neck: Normal range of motion. Neck supple. No thyromegaly present.  Cardiovascular:  Rate controlled  Respiratory: Effort normal and breath sounds normal. No respiratory distress.  GI: Soft. Bowel sounds are normal. She exhibits no distension.  Musculoskeletal:  Right AK tender  Neurological:  Patient is lethargic but arousable. Her daughter is at bedside. She was able to provide her name and place. UE 3+ to 4/5. LLE 3/5. No gross sensory deficits  Skin:  BKA site is dressed.    Results for orders placed during the hospital encounter of 05/24/13 (from the past 24 hour(s))  SURGICAL PCR SCREEN     Status: None   Collection Time    05/25/13 10:21 AM      Result Value Ref Range   MRSA, PCR NEGATIVE  NEGATIVE   Staphylococcus aureus NEGATIVE  NEGATIVE  BASIC METABOLIC PANEL     Status: Abnormal   Collection Time    05/26/13  3:45 AM      Result Value Ref Range   Sodium 140  137 - 147 mEq/L   Potassium 4.1  3.7 - 5.3 mEq/L   Chloride 104  96 - 112 mEq/L   CO2 22  19 - 32 mEq/L   Glucose, Bld 116 (*) 70 - 99 mg/dL   BUN 13  6 - 23 mg/dL   Creatinine, Ser 0.65  0.50 - 1.10 mg/dL   Calcium 8.8  8.4 - 10.5 mg/dL   GFR calc non Af Amer 78 (*) >90 mL/min   GFR calc Af Amer 90 (*) >90 mL/min  CBC     Status: Abnormal   Collection Time    05/26/13  3:45 AM      Result Value Ref Range   WBC 12.3 (*) 4.0 - 10.5 K/uL   RBC 3.24 (*) 3.87 - 5.11 MIL/uL   Hemoglobin 9.6 (*) 12.0 - 15.0 g/dL   HCT 28.1 (*) 36.0 - 46.0 %   MCV 86.7  78.0 - 100.0 fL   MCH 29.6  26.0 - 34.0 pg   MCHC 34.2  30.0 - 36.0 g/dL   RDW 16.1 (*) 11.5 - 15.5 %   Platelets  222  150 - 400 K/uL   No results found.  Assessment/Plan: Diagnosis: right AKA 1. Does the need for close, 24 hr/day medical supervision in concert with the patient's rehab needs make it unreasonable for this patient to be served in a less intensive setting? Potentially 2. Co-Morbidities requiring supervision/potential complications: hthn, afib 3. Due to bladder management, bowel management, safety, skin/wound care, disease management, medication administration, pain management and patient education, does the patient require 24 hr/day rehab nursing? Potentially 4. Does the patient require coordinated care of a physician, rehab nurse, PT, OT to address physical and functional deficits in the context of the above medical diagnosis(es)? No Addressing deficits in the following areas: balance, endurance, locomotion, strength, transferring, bathing, dressing and grooming 5. Can the patient actively participate in an intensive therapy program of at least 3 hrs of therapy per day at least 5 days per week? Potentially 6. The potential for patient to make measurable gains while on inpatient rehab is fair 7. Anticipated functional outcomes upon discharge from inpatient rehab are n/a  with PT, n/a with OT, n/a with SLP. 8. Estimated rehab length of stay to reach the above functional goals is: n/a 9. Does the patient have adequate social supports to accommodate these discharge functional goals? Potentially 10. Anticipated D/C setting: Other 11. Anticipated post  D/C treatments: HH therapy 12. Overall Rehab/Functional Prognosis: good  RECOMMENDATIONS: This patient's condition is appropriate for continued rehabilitative care in the following setting: SNF Patient has agreed to participate in recommended program. Yes Note that insurance prior authorization may be required for reimbursement for recommended care.  Comment: spoke with daughters at length. SNF is most logical option  Meredith Staggers, MD,  [facility]     05/26/2013

## 2013-05-26 NOTE — Progress Notes (Signed)
Subjective: Interval History: none.. Reports pain is improved  Objective: Vital signs in last 24 hours: Temp:  [97.5 F (36.4 C)-99.3 F (37.4 C)] 99.3 F (37.4 C) (04/09 0429) Pulse Rate:  [64-73] 70 (04/09 0429) Resp:  [15-22] 18 (04/09 0429) BP: (153-187)/(37-82) 157/47 mmHg (04/09 0429) SpO2:  [95 %-100 %] 95 % (04/09 0429)  Intake/Output from previous day: 04/08 0701 - 04/09 0700 In: 800 [I.V.:800] Out: 250 [Blood:250] Intake/Output this shift:    Right BKA dressing is intact  Lab Results:  Recent Labs  05/25/13 0258 05/26/13 0345  WBC 6.6 12.3*  HGB 10.6* 9.6*  HCT 31.8* 28.1*  PLT 255 222   BMET  Recent Labs  05/25/13 0258 05/26/13 0345  NA 138 140  K 4.7 4.1  CL 103 104  CO2 24 22  GLUCOSE 87 116*  BUN 20 13  CREATININE 0.90 0.65  CALCIUM 9.1 8.8    Studies/Results: Dg Chest 2 View  04/28/2013   CLINICAL DATA:  Cough and shortness of Breath  EXAM: CHEST  2 VIEW  COMPARISON:  09/17/2002  FINDINGS: A pacing device is again seen. The cardiac shadow is stable. The lungs are hyperinflated without focal infiltrate or sizable effusion. Changes of prior vertebral augmentation are seen.  IMPRESSION: COPD without acute abnormality.  No change from the prior exam.   Electronically Signed   By: Inez Catalina M.D.   On: 04/28/2013 15:19   Ct Foot Right Wo Contrast  05/08/2013   CLINICAL DATA:  Worsening swelling, pain, and redness of foot.  EXAM: CT OF THE RIGHT FOOT WITHOUT CONTRAST  TECHNIQUE: Multidetector CT imaging was performed according to the standard protocol. Multiplanar CT image reconstructions were also generated.  COMPARISON:  None.  FINDINGS: There is soft tissue swelling along the lateral aspect of the right foot at the level of the fifth metatarsal head. The soft tissue swelling extends to the level of the cortex. There is no cortical destruction. There is no bone resort shin or periosteal reaction. There is no subcutaneous emphysema. The joint spaces  are relatively well maintained. There is no acute fracture or dislocation. There is no focal fluid collection. There is generalized edema involving the right foot.  The extensor, flexor and peroneal tendons are grossly intact. The Achilles tendon is intact.  IMPRESSION: Soft tissue swelling along the lateral aspect of the right foot at the level of the fifth metatarsal head without adjacent bone destruction or periosteal reaction. There are no CT findings to suggest osteomyelitis. If there is further clinical concern recommend MRI without and with intravenous contrast.   Electronically Signed   By: Kathreen Devoid   On: 05/08/2013 19:51   Dg Foot Complete Right  05/15/2013   CLINICAL DATA:  Right foot ulcers and cellulitis with swelling and redness and weeping pus.  EXAM: RIGHT FOOT COMPLETE - 3+ VIEW  COMPARISON:  CT dated 05/08/2013.  FINDINGS: Bandage material overlying the distal foot, laterally. Stable calcific density along the lateral aspect of the foot at the level of the fifth MTP joint. No bone destruction or periosteal reaction. No soft tissue gas. Calcaneal spurs.  IMPRESSION: No radiographic evidence of osteomyelitis.   Electronically Signed   By: Enrique Sack M.D.   On: 05/15/2013 19:41   Dg Foot Complete Right  05/08/2013   CLINICAL DATA:  Right foot pain, redness, multiple ulcers  EXAM: RIGHT FOOT COMPLETE - 3+ VIEW  COMPARISON:  None  FINDINGS: Diffuse osseous demineralization.  Minimal scattered narrowing  of interphalangeal joints and first MTP joint.  Dressing artifacts lateral to the fifth MTP joint.  No acute fracture, dislocation or bone destruction.  Minimal calcaneal spurring and diffuse right foot soft tissue swelling.  IMPRESSION: No acute osseous abnormalities.   Electronically Signed   By: Lavonia Dana M.D.   On: 05/08/2013 12:04   Anti-infectives: Anti-infectives   Start     Dose/Rate Route Frequency Ordered Stop   05/25/13 1845  cefUROXime (ZINACEF) 1.5 g in dextrose 5 % 50 mL  IVPB     1.5 g 100 mL/hr over 30 Minutes Intravenous Every 12 hours 05/25/13 1828 05/26/13 2359   05/25/13 0600  [MAR Hold]  cefUROXime (ZINACEF) 1.5 g in dextrose 5 % 50 mL IVPB     (On MAR Hold since 05/25/13 1417)   1.5 g 100 mL/hr over 30 Minutes Intravenous On call to O.R. 05/24/13 1219 05/25/13 1513      Assessment/Plan: s/p Procedure(s): AMPUTATION BELOW KNEE (Right) Stable overall. Will remove dressing tomorrow. Pain much better managed. The daughter is present this morning and is going to prior a sitter to help watch her overnight. Admitted for profound ischemia of her foot with ischemic rest pain and nonhealing ulceration with infection   LOS: 2 days   Arvilla Meres Jamacia Jester 05/26/2013, 7:35 AM

## 2013-05-26 NOTE — Evaluation (Signed)
Occupational Therapy Evaluation Patient Details Name: Regina Hamilton MRN: [id number] DOB: 1923/12/10 Today's Date: 05/26/2013    History of Present Illness pt presents with Ischemic R foot, now s/p BKA.     Clinical Impression   Pt demonstrates decline in function with ADLs and ADL mobility safety with decreased strength, balance and endurance. Pt would benefit form acute OT services to address impairments to increase level of function and safety. Pt and family planning for d/c to SNF for rehab before return home    Follow Up Recommendations  SNF;Supervision/Assistance - 24 hour    Equipment Recommendations  None recommended by OT;Other (comment) (TBD at next venue of care)    Recommendations for Other Services       Precautions / Restrictions Precautions Precautions: Fall Restrictions Weight Bearing Restrictions: Yes LLE Weight Bearing: Non weight bearing      Mobility Bed Mobility Overal bed mobility: Needs Assistance Bed Mobility: Supine to Sit     Supine to sit: Max assist     General bed mobility comments: cues for sequencing and A for Bil LEs and trunk.    Transfers Overall transfer level: Needs assistance Equipment used: 2 person hand held assist Transfers: Sit to/from [facility] Sit to Stand: Mod assist;+2 physical assistance Stand pivot transfers: Max assist;+2 physical assistance       General transfer comment: cues for UE use and step-by-step through pivot to chair.      Balance Overall balance assessment: Needs assistance Sitting-balance support: Bilateral upper extremity supported;Single extremity supported;Feet supported Sitting balance-Leahy Scale: Poor Sitting balance - Comments: pt tends to lean posteriorly.   Postural control: Posterior lean Standing balance support: Bilateral upper extremity supported Standing balance-Leahy Scale: Poor                              ADL Overall ADL's : Needs  assistance/impaired     Grooming: Wash/dry hands;Wash/dry face;Sitting;Minimal assistance Grooming Details (indicate cue type and reason): min A for balance/support Upper Body Bathing: Minimal assitance;Sitting Upper Body Bathing Details (indicate cue type and reason): min A for balance/support Lower Body Bathing: Sitting/lateral leans;Maximal assistance   Upper Body Dressing : Minimal assistance;Sitting Upper Body Dressing Details (indicate cue type and reason): min A for balance/support Lower Body Dressing: Total assistance   Toilet Transfer: Moderate assistance;Maximal assistance;+2 for physical assistance;Stand-pivot;Cueing for safety Toilet Transfer Details (indicate cue type and reason): cues for UE use and step-by-step through pivot  Toileting- Clothing Manipulation and Hygiene: Total assistance;Sit to/from stand       Functional mobility during ADLs: Moderate assistance;Maximal assistance;+2 for physical assistance;Cueing for safety       Vision  wears glasses at all times                              Pertinent Vitals/Pain Did not rate, but states she has pain in R LE, VSS     Hand Dominance Right   Extremity/Trunk Assessment Upper Extremity Assessment Upper Extremity Assessment: Generalized weakness   Lower Extremity Assessment Lower Extremity Assessment: Defer to PT evaluation RLE Deficits / Details: New R BKA, very painful.  Limited ROM 2/2 pain and edema.   RLE: Unable to fully assess due to pain   Cervical / Trunk Assessment Cervical / Trunk Assessment: Normal   Communication Communication Communication: No difficulties   Cognition Arousal/Alertness: Awake/alert Behavior During Therapy: WFL for tasks assessed/performed  Overall Cognitive Status: Within Functional Limits for tasks assessed                     General Comments   Pt very pleasant, cooperative and eager to participate                Home Living Family/patient  expects to be discharged to:: [place]: Alone;Children Available Help at Discharge: Family;Available 24 hours/day Type of Home: House Home Access: Ramped entrance     Home Layout: Two level;Able to live on main level with bedroom/bathroom               Home Equipment: Walker - 4 wheels;Walker - 2 wheels;Bedside commode          Prior Functioning/Environment Level of Independence: Independent             OT Diagnosis: Generalized weakness;Acute pain   OT Problem List: Decreased strength;Decreased knowledge of use of DME or AE;Decreased activity tolerance;Impaired balance (sitting and/or standing);Pain   OT Treatment/Interventions: Self-care/ADL training;Balance training;Therapeutic exercise;Neuromuscular education;Therapeutic activities;DME and/or AE instruction;Patient/family education    OT Goals(Current goals can be found in the care plan section) Acute Rehab OT Goals Patient Stated Goal: to get well OT Goal Formulation: With patient/family Time For Goal Achievement: 06/02/13 Potential to Achieve Goals: Good ADL Goals Pt Will Perform Grooming: with min guard assist;with supervision;with set-up;sitting Pt Will Perform Upper Body Bathing: with min guard assist;with supervision;with set-up;sitting Pt Will Perform Lower Body Bathing: with max assist;with mod assist;sitting/lateral leans Pt Will Perform Upper Body Dressing: with supervision;with set-up;with min guard assist;sitting Pt Will Transfer to Toilet: with mod assist;bedside commode Pt Will Perform Toileting - Clothing Manipulation and hygiene: with mod assist;sitting/lateral leans Additional ADL Goal #1: pt will sit EOB with min guard A for balance and safety for ADLs  OT Frequency: Min 2X/week   Barriers to D/C: Decreased caregiver support          Co-evaluation PT/OT/SLP Co-Evaluation/Treatment: Yes Reason for Co-Treatment: For patient/therapist safety;Other (comment)  (pain) PT goals addressed during session: Mobility/safety with mobility;Balance OT goals addressed during session: ADL's and self-care      End of Session Equipment Utilized During Treatment: Gait belt  Activity Tolerance: Patient limited by fatigue;Patient limited by pain Patient left: in chair;with family/visitor present;with call bell/phone within reach   Time: 6440-3474 OT Time Calculation (min): 24 min Charges:  OT General Charges $OT Visit: 1 Procedure OT Evaluation $Initial OT Evaluation Tier I: 1 Procedure OT Treatments $Therapeutic Activity: 8-22 mins G-Codes:    Mosetta Putt 06/17/2013, 1:49 PM

## 2013-05-26 NOTE — Progress Notes (Signed)
Clinical Social Work Department BRIEF PSYCHOSOCIAL ASSESSMENT 05/26/2013  Patient:  Regina Hamilton, Regina Hamilton     Account Number:  [id number]     Admit date:  05/24/2013  Clinical Social Worker:  Megan Salon  Date/Time:  05/26/2013 02:41 PM  Referred by:  Physician  Date Referred:  05/26/2013 Referred for  SNF Placement   Other Referral:   Interview type:  Other - See comment Other interview type:   CSW spoke to patient's daughter Regina Hamilton at bedside    PSYCHOSOCIAL DATA Living Status:  ALONE Admitted from facility:   Level of care:   Primary support name:  Ailene Ravel Primary support relationship to patient:  CHILD, ADULT Degree of support available:   Good    CURRENT CONCERNS Current Concerns  Post-Acute Placement   Other Concerns:    SOCIAL WORK ASSESSMENT / PLAN Clinical Social Worker received referral for SNF placement at d/c. CSW introduced self and explained reason for visit. Patient had visitor by bedside, daughter Regina Hamilton. CSW explained SNF process and provided SNF packet to patient and family. Patient appeared to be groggy and daughter spoke in regards to patient. Patient's daughter reported they are agreeable for SNF placement and prefer [place] in [facility]. CSW encouraged patient's daughter to think about additional SNF options pending availability of preferred facility. CSW will complete FL2 for MD's signature and will update patient and family when bed offers are received.   Assessment/plan status:  Psychosocial Support/Ongoing Assessment of Needs Other assessment/ plan:   Information/referral to community resources:   SNF packet and CSW information    PATIENT'S/FAMILY'S RESPONSE TO PLAN OF CARE: Patient's daughter states that they really want the facility in [place], [place], stating she will follow up with them as well.       Jeanette Caprice, [name], [place]

## 2013-05-26 NOTE — Progress Notes (Signed)
[facility] PROGRESS NOTE  Regina Hamilton BOF:[id number] DOB: 01-Sep-1923 DOA: 05/24/2013 PCP: Walker Kehr, MD  Assessment/Plan: 1. Critical limb ischemia -Has history of severe PVD, seen by vascular surgery in the outpt clinic -Vascular Surgery consulted -S/P BKA, procedure performed on 05/25/2013 -Physical therapy consulted -IV narcotics for pain management  2. Hypertension -Blood pressures elevated -Will increase lisinopril to 20 mg PO BID from 10mg  PO BID -Continue Norvasc 10 mg Po q daily -PRN labetolol  4. History of Afib -Presently in NSR -S/P pacemaker implant  3. Protein Calorie malnutrition -Boost TID     Code Status: Full Family Communication: Spoke with patient's daughter at bedside Disposition Plan: [place]   Consultants:  Vascular Surgery  Procedures:  Plan for BKA on 05/25/13  Antibiotics:  None  HPI/Subjective: Patient is a 80 year old female with a past medical history of peripheral vascular disease, atrial fibrillation, hypertension, recently treated for [place] [facility] completing 10 days of empiric antibiotic therapy, admitted to the medicine service on 05/24/2013 with complaints of severe right foot pain. She was diagnosed with critical lower limb ischemia. Patient seen and evaluated by vascular surgery, undergoing BKA on 05/25/2013  Objective: Filed Vitals:   05/26/13 1040  BP: 182/61  Pulse:   Temp:   Resp:     Intake/Output Summary (Last 24 hours) at 05/26/13 1447 Last data filed at 05/25/13 1601  Gross per 24 hour  Intake    800 ml  Output    250 ml  Net    550 ml   Filed Weights   05/24/13 1100  Weight: 50.349 kg (111 lb)    Exam:   General:  Patient complaining of right foot pain  Cardiovascular: Regular rate rhythm, normal S1-S2  Respiratory: Clear to auscultation  Abdomen: Soft nontender nondistended  Musculoskeletal: S/P BKA to right lower extremity, bandaged, no obvious bleed  Data Reviewed: Basic  Metabolic Panel:  Recent Labs Lab 05/25/13 0258 05/26/13 0345  NA 138 140  K 4.7 4.1  CL 103 104  CO2 24 22  GLUCOSE 87 116*  BUN 20 13  CREATININE 0.90 0.65  CALCIUM 9.1 8.8   Liver Function Tests: No results found for this basename: AST, ALT, ALKPHOS, BILITOT, PROT, ALBUMIN,  in the last 168 hours No results found for this basename: LIPASE, AMYLASE,  in the last 168 hours No results found for this basename: AMMONIA,  in the last 168 hours CBC:  Recent Labs Lab 05/25/13 0258 05/26/13 0345  WBC 6.6 12.3*  HGB 10.6* 9.6*  HCT 31.8* 28.1*  MCV 86.4 86.7  PLT 255 222   Cardiac Enzymes: No results found for this basename: CKTOTAL, CKMB, CKMBINDEX, TROPONINI,  in the last 168 hours BNP (last 3 results)  Recent Labs  09/02/12 1259 09/03/12 0412 09/04/12 0435  PROBNP 2164.0* 1834.0* 1066.0*   CBG: No results found for this basename: GLUCAP,  in the last 168 hours  Recent Results (from the past 240 hour(s))  SURGICAL PCR SCREEN     Status: None   Collection Time    05/25/13 10:21 AM      Result Value Ref Range Status   MRSA, PCR NEGATIVE  NEGATIVE Final   Staphylococcus aureus NEGATIVE  NEGATIVE Final   Comment:            The Xpert SA Assay (FDA     approved for NASAL specimens     in patients over 24 years of age),     is one component  of     a comprehensive surveillance     program.  Test performance has     been validated by [facility] for patients greater     than or equal to 77 year old.     It is not intended     to diagnose infection nor to     guide or monitor treatment.     Studies: No results found.  Scheduled Meds: . amLODipine  10 mg Oral Daily  . aspirin EC  81 mg Oral Daily  . cefUROXime (ZINACEF)  IV  1.5 g Intravenous Q12H  . cholecalciferol  4,000 Units Oral Daily  . docusate sodium  100 mg Oral Daily  . dorzolamide  1 drop Left Eye BID  . enoxaparin (LOVENOX) injection  30 mg Subcutaneous Q24H  . fentaNYL  25 mcg  Transdermal Q72H  . gabapentin  100 mg Oral QHS  . lactose free nutrition  237 mL Oral TID WC  . lisinopril  20 mg Oral BID  . loteprednol  1 drop Both Eyes Daily  . multivitamin with minerals  1 tablet Oral Daily  . pantoprazole  40 mg Oral Daily  . polyethylene glycol  17 g Oral Daily  . pyridOXINE  100 mg Oral Daily  . sodium chloride  3 mL Intravenous Q12H  . vitamin C  500 mg Oral Daily   Continuous Infusions: . sodium chloride 50 mL/hr at 05/26/13 0744    Principal Problem:   Critical lower limb ischemia Active Problems:   HYPERTENSION   Atrial fibrillation   Other malaise and fatigue   UNSPECIFIED PERIPHERAL VASCULAR DISEASE   Chronic diastolic heart failure   Peripheral neuropathy   Chronic ulcer of right foot   Pain in limb-Right Thigh/Leg   Severe protein-calorie malnutrition    Time spent: 35 min    [facility] Pager [phone]. If 7PM-7AM, please contact night-coverage at [email or web address], password [facility] 05/26/2013, 2:47 PM  LOS: 2 days

## 2013-05-26 NOTE — Clinical Documentation Improvement (Signed)
   Please clarify if the patient's rest pain and ischemic ulcers of the right foot are 2/2:   - Peripheral Vascular Disease   - Other Condition   - Unable to Clinically Determine   Thank You,  Erling Conte ,RN BSN CCDS Certified Clinical Documentation Specialist:  [phone] [place] Information Management

## 2013-05-26 NOTE — Progress Notes (Signed)
Rehab admissions - I followed up with pt and her daughter about Dr. Charm Barges recommendation for further rehab at a skilled nursing facility. Dtr shared that plans are in place for SNF closer to pt's home and they had no further questions.  I will sign off pt's case at this time in light of recommendation for skilled nursing at discharge. Please call me if any questions arise. I updated Ria Comment, Education officer, museum as well. Thanks.  Nanetta Batty, PT Rehabilitation Admissions Coordinator [phone]

## 2013-05-26 NOTE — Progress Notes (Signed)
Clinical Social Work Department CLINICAL SOCIAL WORK PLACEMENT NOTE 05/26/2013  Patient:  Regina Hamilton, Regina Hamilton  Account Number:  [id number] Admit date:  05/24/2013  Clinical Social Worker:  Megan Salon  Date/time:  05/26/2013 02:43 PM  Clinical Social Work is seeking post-discharge placement for this patient at the following level of care:   [place]   (*CSW will update this form in Epic as items are completed)   05/26/2013  Patient/family provided with [facility] Department of Clinical Social Work's list of facilities offering this level of care within the geographic area requested by the patient (or if unable, by the patient's family).  05/26/2013  Patient/family informed of their freedom to choose among providers that offer the needed level of care, that participate in Medicare, Medicaid or managed care program needed by the patient, have an available bed and are willing to accept the patient.  05/26/2013  Patient/family informed of [facility]' ownership interest in [facility], as well as of the fact that they are under no obligation to receive care at this facility.  PASARR submitted to EDS on 05/26/2013 PASARR number received from EDS on 05/26/2013  FL2 transmitted to all facilities in geographic area requested by pt/family on  05/26/2013 FL2 transmitted to all facilities within larger geographic area on   Patient informed that his/her managed care company has contracts with or will negotiate with  certain facilities, including the following:     Patient/family informed of bed offers received:   Patient chooses bed at  Physician recommends and patient chooses bed at    Patient to be transferred to  on   Patient to be transferred to facility by   The following physician request were entered in Epic:   Additional Comments:  Jeanette Caprice, [name], [place]

## 2013-05-27 ENCOUNTER — Encounter (HOSPITAL_COMMUNITY): Payer: Self-pay | Admitting: General Practice

## 2013-05-27 ENCOUNTER — Telehealth: Payer: Self-pay | Admitting: Vascular Surgery

## 2013-05-27 DIAGNOSIS — M79609 Pain in unspecified limb: Secondary | ICD-10-CM

## 2013-05-27 LAB — BASIC METABOLIC PANEL
BUN: 11 mg/dL (ref 6–23)
CALCIUM: 8.9 mg/dL (ref 8.4–10.5)
CHLORIDE: 97 meq/L (ref 96–112)
CO2: 25 meq/L (ref 19–32)
CREATININE: 0.64 mg/dL (ref 0.50–1.10)
GFR calc Af Amer: >90 mL/min (ref >90)
GFR calc non Af Amer: 78 mL/min — ABNORMAL LOW (ref >90)
Glucose, Bld: 114 mg/dL — ABNORMAL HIGH (ref 70–99)
Potassium: 3.8 mEq/L (ref 3.7–5.3)
Sodium: 134 mEq/L — ABNORMAL LOW (ref 137–147)

## 2013-05-27 LAB — CBC
HEMATOCRIT: 25.6 % — AB (ref 36.0–46.0)
Hemoglobin: 8.5 g/dL — ABNORMAL LOW (ref 12.0–15.0)
MCH: 28.8 pg (ref 26.0–34.0)
MCHC: 33.2 g/dL (ref 30.0–36.0)
MCV: 86.8 fL (ref 78.0–100.0)
PLATELETS: 202 10*3/uL (ref 150–400)
RBC: 2.95 MIL/uL — ABNORMAL LOW (ref 3.87–5.11)
RDW: 16.3 % — ABNORMAL HIGH (ref 11.5–15.5)
WBC: 10.5 10*3/uL (ref 4.0–10.5)

## 2013-05-27 MED ORDER — LACTULOSE 10 GM/15ML PO SOLN
20.0000 g | Freq: Once | ORAL | Status: AC
  Administered 2013-05-27: 20 g via ORAL
  Filled 2013-05-27: qty 30

## 2013-05-27 MED ORDER — METOPROLOL SUCCINATE ER 25 MG PO TB24: 12.5000 mg | ORAL_TABLET | Freq: Every day | ORAL | Status: DC

## 2013-05-27 MED ORDER — OXYCODONE-ACETAMINOPHEN 5-325 MG PO TABS: 1.0000 | ORAL_TABLET | Freq: Four times a day (QID) | ORAL | Status: DC | PRN

## 2013-05-27 MED ORDER — BISACODYL 10 MG RE SUPP
10.0000 mg | Freq: Once | RECTAL | Status: AC
  Administered 2013-05-27: 10 mg via RECTAL
  Filled 2013-05-27: qty 1

## 2013-05-27 MED ORDER — FENTANYL 25 MCG/HR TD PT72: 25.0000 ug | MEDICATED_PATCH | TRANSDERMAL | Status: DC

## 2013-05-27 MED ORDER — MILK AND MOLASSES ENEMA
1.0000 | Freq: Once | RECTAL | Status: AC
  Administered 2013-05-27: 250 mL via RECTAL
  Filled 2013-05-27: qty 250

## 2013-05-27 MED ORDER — POTASSIUM CHLORIDE CRYS ER 20 MEQ PO TBCR: 20.0000 meq | EXTENDED_RELEASE_TABLET | Freq: Every day | ORAL | Status: DC

## 2013-05-27 MED ORDER — PYRIDOXINE HCL 100 MG PO TABS: 100.0000 mg | ORAL_TABLET | Freq: Every day | ORAL | Status: DC

## 2013-05-27 MED ORDER — ALPRAZOLAM 0.25 MG PO TABS: 0.2500 mg | ORAL_TABLET | Freq: Every evening | ORAL | Status: DC | PRN

## 2013-05-27 MED ORDER — LISINOPRIL 20 MG PO TABS: 20.0000 mg | ORAL_TABLET | Freq: Two times a day (BID) | ORAL | Status: DC

## 2013-05-27 MED ORDER — POLYETHYLENE GLYCOL 3350 17 G PO PACK: 17.0000 g | PACK | Freq: Every day | ORAL | Status: DC

## 2013-05-27 NOTE — Progress Notes (Signed)
CSW spoke to patient's daughter by bedside about possible dc today. Patient's daughter appeared shocked by this news. CSW explained that is was a possibility and their first choice, [facility] [place] is able to make a bed offer. Patient's daughter pleased with this information.  Jeanette Caprice, [name], [place]

## 2013-05-27 NOTE — Telephone Encounter (Addendum)
Message copied by Gena Fray on Fri May 27, 2013  2:00 PM ------      Message from: Denman George      Created: Fri May 27, 2013  1:42 PM      Regarding: Zigmund Daniel log/ f/u appt 4 wks w/ TFE                   ----- Message -----         From: Ulyses Amor, PA-C         Sent: 05/27/2013  12:53 PM           To: [facility] Charge Pool            F/U with Dr. Donnetta Hutching in 4 weeks s/p right BKA ------  06/28/13 @ 11:45- spoke with pts daughter to notify on 05/27/13

## 2013-05-27 NOTE — Progress Notes (Signed)
Clinical Social Worker facilitated patient discharge by contacting the patient, family and facility, [facility]. CSW explained that because the facility is over 50 miles, EMS needs payment upfront. Patient's family agreeable and spoke to EMS to pay this amount. CSW will sign off, as social work intervention is no longer needed.  Jeanette Caprice, [name], [place]

## 2013-05-27 NOTE — Progress Notes (Addendum)
[facility]  Subjective  - Painful right hip, that was there prior to surgery.   Objective 168/40 70 98 F (36.7 C) (Oral) 16 98%  Intake/Output Summary (Last 24 hours) at 05/27/13 1250 Last data filed at 05/27/13 0124  Gross per 24 hour  Intake    120 ml  Output    325 ml  Net   -205 ml    Incision at BKA site without hematoma or erythema Healing well   Assessment/Planning: POD #2  Right BKA Staples will be removed after 4 weeks Keep dry dressing applied and change daily until no drainage. F/U in the office in 4 weeks with Dr. Ander Purpura 05/27/2013 12:50 PM --  Laboratory Lab Results:  Recent Labs  05/26/13 0345 05/27/13 0459  WBC 12.3* 10.5  HGB 9.6* 8.5*  HCT 28.1* 25.6*  PLT 222 202   BMET  Recent Labs  05/26/13 0345 05/27/13 0459  NA 140 134*  K 4.1 3.8  CL 104 97  CO2 22 25  GLUCOSE 116* 114*  BUN 13 11  CREATININE 0.65 0.64  CALCIUM 8.8 8.9    COAG Lab Results  Component Value Date   INR 1.01 05/25/2013   INR 1.03 06/07/2012   INR 0.92 03/27/2010   No results found for this basename: PTT      I have examined the patient, reviewed and agree with above. Discussed with the patient and her daughters present. Feel comfortable with there being discharged to skilled nursing facility today. Healing below knee amputation.  Rosetta Posner, MD 05/27/2013 2:57 PM

## 2013-05-27 NOTE — Progress Notes (Signed)
Patient was able to produce large bowel movement after multiple interventions. Roxan Hockey

## 2013-05-27 NOTE — Discharge Summary (Signed)
Physician Discharge Summary  Regina Hamilton ACZ:[id number] DOB: 02-06-1924 DOA: 05/24/2013  PCP: Walker Kehr, MD  Admit date: 05/24/2013 Discharge date: 05/27/2013  Time spent: 35 minutes  Recommendations for Outpatient Follow-up:  1. Please followup on repeat CBC and BMP in 3 days, patient having a hemoglobin of 8.5 on 05/27/2013.  2. Discharge to [place] 3.   Status post BKA, keep dry dressing applied and change daily until no drainage. 4.   Please followup on blood pressures, patient hypertensive during this hospitalization   Discharge Diagnoses:  Principal Problem:   Critical lower limb ischemia Active Problems:   HYPERTENSION   Atrial fibrillation   Other malaise and fatigue   UNSPECIFIED PERIPHERAL VASCULAR DISEASE   Chronic diastolic heart failure   Peripheral neuropathy   Chronic ulcer of right foot   Pain in limb-Right Thigh/Leg   Severe protein-calorie malnutrition   Discharge Condition: Stable  Diet recommendation: Heart Healthy Diet  Filed Weights   05/24/13 1100  Weight: 50.349 kg (111 lb)    History of present illness:  80 year old female with history of peripheral vascular disease (reportedly not a surgical candidate for revascularization), A. Fib, hypertension and history of pacemaker plac who was recently hospitalized at [facility] for cellulitis of her right foot and completed 10 days of antibiotics was seen by her PCP and wound care clinic for worsening pain over her right foot . She was falling up at the wound care Center and podiatry for right foot and toe debridement. Following discharge from the hospital she continued to have pain over the right foot unimproved with the narcotics. She presented to the wound care center and was referred to vascular surgery clinic today. Patient was found to have ischemic changes over the right foot with drainage from her second and third toenails and ulcers over the lateral aspect of the right foot. No pulses  were palpable below the popliteal. Given concern for a right foot ischemia and nonhealing ulcer with intractable pain recommended for hospital admission with plan on below-knee amputation on 4/8. Patient denies headache, dizziness, fever, chills, nausea , vomiting, chest pain, palpitations, SOB, abdominal pain, bowel or urinary symptoms. reports poor appetite and weight loss.   Hospital Course:  Patient is a pleasant 80 year old female with a past medical history of peripheral vascular disease, atrial fibrillation, hypertension, who was admitted to the medicine service on 05/24/2013 presented with worsening pain over right foot as well as worsening ischemic ulcers. She was found to have critical limb ischemia involving right lower extremity. Patient was seen and evaluated by Dr. Donnetta Hutching of vascular surgery who recommended below the knee amputation. She was taken to the OR on 05/25/2013 undergoing right below-the-knee amputation, tolerated procedure well there were no immediate complications. Postoperative course unremarkable, physical therapy consult she started PT during this hospitalization. With regard to hypertension, patient having elevated blood pressures for which I increased her lisinopril from 10 mg by mouth twice a day to 20 mg by mouth twice a day. Norvasc was continued at 10 mg by mouth daily as metoprolol 12.5 mg by mouth twice a day was added on 05/27/2013 for better blood pressure control. Other issues addressed or this hospitalization include the development of constipation for which she was administered an enema, having results. Physical therapy recommending skilled nursing facility placement for acute rehabilitation. Family expressing concerns for discharge as a family meeting was held to address concerns. I discussed case with vascular surgery who felt patient stable from their standpoint  to go to rehab. Wound appeared clean without evidence of hematoma or infection. She was discharged in stable  condition on 05/27/2013 to SNF  Procedures:  Below the knee amputation, procedure performed on 05/25/2048  Consultations:  Vascular surgery  Case manager  Physical therapy  Social work  Discharge Exam: Filed Vitals:   05/27/13 0455  BP: 168/40  Pulse: 70  Temp: 98 F (36.7 C)  Resp: 16   General: Patient complaining of right foot pain  Cardiovascular: Regular rate rhythm, normal S1-S2 Respiratory: Clear to auscultation  Abdomen: Soft nontender nondistended  Musculoskeletal: S/P BKA to right lower extremity, bandaged, no obvious bleed   Discharge Instructions You were cared for by a hospitalist during your hospital stay. If you have any questions about your discharge medications or the care you received while you were in the hospital after you are discharged, you can call the unit and asked to speak with the hospitalist on call if the hospitalist that took care of you is not available. Once you are discharged, your primary care physician will handle any further medical issues. Please note that NO REFILLS for any discharge medications will be authorized once you are discharged, as it is imperative that you return to your primary care physician (or establish a relationship with a primary care physician if you do not have one) for your aftercare needs so that they can reassess your need for medications and monitor your lab values.   Future Appointments Provider Department Dept Phone   06/28/2013 11:45 AM Rosetta Posner, MD [facility] [phone]   12/21/2013 10:30 AM [facility] [place] [facility] [address] [phone]   12/21/2013 11:30 AM Angelia Mould, MD [facility] [phone]       Medication List    STOP taking these medications       aliskiren 300 MG tablet  Commonly known as:  TEKTURNA     HYDROmorphone 4 MG tablet  Commonly known as:  DILAUDID     POTASSIUM PO     ZINC 15 PO       TAKE these medications       ALPRAZolam 0.25 MG tablet  Commonly known as:  XANAX  Take 1 tablet (0.25 mg total) by mouth at bedtime as needed for sleep.     amLODipine 10 MG tablet  Commonly known as:  NORVASC  Take 1 tablet (10 mg total) by mouth daily.     aspirin EC 81 MG tablet  Take 81 mg by mouth daily.     clopidogrel 75 MG tablet  Commonly known as:  PLAVIX  Take 75 mg by mouth daily.     docusate sodium 100 MG capsule  Commonly known as:  COLACE  Take 200 mg by mouth daily.     dorzolamide 2 % ophthalmic solution  Commonly known as:  TRUSOPT  Place 1 drop into the left eye 2 (two) times daily.     fentaNYL 25 MCG/HR patch  Commonly known as:  DURAGESIC  Place 1 patch (25 mcg total) onto the skin every 3 (three) days.     gabapentin 100 MG capsule  Commonly known as:  NEURONTIN  Take 1 capsule (100 mg total) by mouth at bedtime.     lisinopril 20 MG tablet  Commonly known as:  PRINIVIL,ZESTRIL  Take 1 tablet (20 mg total) by mouth 2 (two) times daily.     loteprednol 0.5 % ophthalmic suspension  Commonly  known as:  LOTEMAX  Place 1 drop into both eyes daily.     metoprolol succinate 25 MG 24 hr tablet  Commonly known as:  TOPROL XL  Take 0.5 tablets (12.5 mg total) by mouth daily.     multivitamin with minerals Tabs tablet  Take 1 tablet by mouth daily at 12 noon.     oxyCODONE-acetaminophen 5-325 MG per tablet  Commonly known as:  PERCOCET/ROXICET  Take 1 tablet by mouth every 6 (six) hours as needed.     polyethylene glycol packet  Commonly known as:  MIRALAX / GLYCOLAX  Take 17 g by mouth daily.     potassium chloride SA 20 MEQ tablet  Commonly known as:  K-DUR,KLOR-CON  Take 1 tablet (20 mEq total) by mouth daily.     pyridoxine 100 MG tablet  Commonly known as:  B-6  Take 1 tablet (100 mg total) by mouth daily.     vitamin C 500 MG tablet  Commonly known as:  ASCORBIC ACID  Take 500 mg by mouth daily at 12 noon.     Vitamin D 2000  UNITS tablet  Take 4,000 Units by mouth daily at 12 noon.      ASK your doctor about these medications       HYDROcodone-acetaminophen 5-325 MG per tablet  Commonly known as:  NORCO/VICODIN  Take 2 tablets by mouth every 6 (six) hours as needed for moderate pain.       Allergies  Allergen Reactions  . Latanoprost Anaphylaxis    redness  . Pentoxifylline     Severe SOB, mental confusion per daughter  . Versed [Midazolam] Other (See Comments)     hallucinations per daughter  . Warfarin Sodium Rash  . Alphagan P [Brimonidine Tartrate] Photosensitivity    Redness bilateral eye  . Oxycodone Hcl Itching  . Avelox [Moxifloxacin Hcl In Nacl] Other (See Comments)    tongue swelling  . Clonidine Derivatives Other (See Comments)    Pt's daughter states she has severe hallucinations  . Dilaudid [Hydromorphone Hcl] Nausea And Vomiting    IV formulation only  . Diltiazem Hcl Hives  . Doxycycline Nausea And Vomiting  . Hydralazine Itching  . Meloxicam Other (See Comments)    Odd feeling  . Moxifloxacin Other (See Comments)     Tongue swelling; mouth ulcers; confusion  . Contrast Media [Iodinated Diagnostic Agents] Rash    Contrast dye.  . Iron Rash       Follow-up Information   Follow up with EARLY, TODD, MD In 4 weeks. (sent meassage)    Specialty:  Vascular Surgery   Contact information:   [place] [id number] [phone]       Follow up with Walker Kehr, MD In 2 weeks.   Specialty:  Internal Medicine   Contact information:   [place] [address] [phone]        The results of significant diagnostics from this hospitalization (including imaging, microbiology, ancillary and laboratory) are listed below for reference.    Significant Diagnostic Studies: Dg Chest 2 View  04/28/2013   CLINICAL DATA:  Cough and shortness of Breath  EXAM: CHEST  2 VIEW  COMPARISON:  09/17/2002  FINDINGS: A pacing device is again  seen. The cardiac shadow is stable. The lungs are hyperinflated without focal infiltrate or sizable effusion. Changes of prior vertebral augmentation are seen.  IMPRESSION: COPD without acute abnormality.  No change from the prior  exam.   Electronically Signed   By: Inez Catalina M.D.   On: 04/28/2013 15:19   Ct Foot Right Wo Contrast  05/08/2013   CLINICAL DATA:  Worsening swelling, pain, and redness of foot.  EXAM: CT OF THE RIGHT FOOT WITHOUT CONTRAST  TECHNIQUE: Multidetector CT imaging was performed according to the standard protocol. Multiplanar CT image reconstructions were also generated.  COMPARISON:  None.  FINDINGS: There is soft tissue swelling along the lateral aspect of the right foot at the level of the fifth metatarsal head. The soft tissue swelling extends to the level of the cortex. There is no cortical destruction. There is no bone resort shin or periosteal reaction. There is no subcutaneous emphysema. The joint spaces are relatively well maintained. There is no acute fracture or dislocation. There is no focal fluid collection. There is generalized edema involving the right foot.  The extensor, flexor and peroneal tendons are grossly intact. The Achilles tendon is intact.  IMPRESSION: Soft tissue swelling along the lateral aspect of the right foot at the level of the fifth metatarsal head without adjacent bone destruction or periosteal reaction. There are no CT findings to suggest osteomyelitis. If there is further clinical concern recommend MRI without and with intravenous contrast.   Electronically Signed   By: Kathreen Devoid   On: 05/08/2013 19:51   Dg Foot Complete Right  05/15/2013   CLINICAL DATA:  Right foot ulcers and cellulitis with swelling and redness and weeping pus.  EXAM: RIGHT FOOT COMPLETE - 3+ VIEW  COMPARISON:  CT dated 05/08/2013.  FINDINGS: Bandage material overlying the distal foot, laterally. Stable calcific density along the lateral aspect of the foot at the level of the  fifth MTP joint. No bone destruction or periosteal reaction. No soft tissue gas. Calcaneal spurs.  IMPRESSION: No radiographic evidence of osteomyelitis.   Electronically Signed   By: Enrique Sack M.D.   On: 05/15/2013 19:41   Dg Foot Complete Right  05/08/2013   CLINICAL DATA:  Right foot pain, redness, multiple ulcers  EXAM: RIGHT FOOT COMPLETE - 3+ VIEW  COMPARISON:  None  FINDINGS: Diffuse osseous demineralization.  Minimal scattered narrowing of interphalangeal joints and first MTP joint.  Dressing artifacts lateral to the fifth MTP joint.  No acute fracture, dislocation or bone destruction.  Minimal calcaneal spurring and diffuse right foot soft tissue swelling.  IMPRESSION: No acute osseous abnormalities.   Electronically Signed   By: Lavonia Dana M.D.   On: 05/08/2013 12:04    Microbiology: Recent Results (from the past 240 hour(s))  SURGICAL PCR SCREEN     Status: None   Collection Time    05/25/13 10:21 AM      Result Value Ref Range Status   MRSA, PCR NEGATIVE  NEGATIVE Final   Staphylococcus aureus NEGATIVE  NEGATIVE Final   Comment:            The Xpert SA Assay (FDA     approved for NASAL specimens     in patients over 11 years of age),     is one component of     a comprehensive surveillance     program.  Test performance has     been validated by [employer] for patients greater     than or equal to 2 year old.     It is not intended     to diagnose infection nor to     guide or monitor treatment.  Labs: Basic Metabolic Panel:  Recent Labs Lab 05/25/13 0258 05/26/13 0345 05/27/13 0459  NA 138 140 134*  K 4.7 4.1 3.8  CL 103 104 97  CO2 24 22 25   GLUCOSE 87 116* 114*  BUN 20 13 11   CREATININE 0.90 0.65 0.64  CALCIUM 9.1 8.8 8.9   Liver Function Tests: No results found for this basename: AST, ALT, ALKPHOS, BILITOT, PROT, ALBUMIN,  in the last 168 hours No results found for this basename: LIPASE, AMYLASE,  in the last 168 hours No results found for  this basename: AMMONIA,  in the last 168 hours CBC:  Recent Labs Lab 05/25/13 0258 05/26/13 0345 05/27/13 0459  WBC 6.6 12.3* 10.5  HGB 10.6* 9.6* 8.5*  HCT 31.8* 28.1* 25.6*  MCV 86.4 86.7 86.8  PLT 255 222 202   Cardiac Enzymes: No results found for this basename: CKTOTAL, CKMB, CKMBINDEX, TROPONINI,  in the last 168 hours BNP: BNP (last 3 results)  Recent Labs  09/02/12 1259 09/03/12 0412 09/04/12 0435  PROBNP 2164.0* 1834.0* 1066.0*   CBG: No results found for this basename: GLUCAP,  in the last 168 hours     Signed:  [facility] 05/27/2013, 2:55 PM

## 2013-05-27 NOTE — Progress Notes (Signed)
Pateint discharged to SNF. Report called and given to [facility], RN. Roxan Hockey

## 2013-05-27 NOTE — Progress Notes (Signed)
Clinical Social Work Department CLINICAL SOCIAL WORK PLACEMENT NOTE 05/27/2013  Patient:  Regina Hamilton, Regina Hamilton  Account Number:  [id number] [place] date:  05/24/2013  Clinical Social Worker:  Megan Salon  Date/time:  05/26/2013 02:43 PM  Clinical Social Work is seeking post-discharge placement for this patient at the following level of care:   [place]   (*CSW will update this form in Epic as items are completed)   05/26/2013  Patient/family provided with [facility] Department of Clinical Social Work's list of facilities offering this level of care within the geographic area requested by the patient (or if unable, by the patient's family).  05/26/2013  Patient/family informed of their freedom to choose among providers that offer the needed level of care, that participate in Medicare, Medicaid or managed care program needed by the patient, have an available bed and are willing to accept the patient.  05/26/2013  Patient/family informed of [facility]' ownership interest in [facility], as well as of the fact that they are under no obligation to receive care at this facility.  PASARR submitted to EDS on 05/26/2013 PASARR number received from EDS on 05/26/2013  FL2 transmitted to all facilities in geographic area requested by pt/family on  05/26/2013 FL2 transmitted to all facilities within larger geographic area on   Patient informed that his/her managed care company has contracts with or will negotiate with  certain facilities, including the following:     Patient/family informed of bed offers received:  05/27/2013 Patient chooses bed at Other Physician recommends and patient chooses bed at    Patient to be transferred to Other on  05/27/2013 Patient to be transferred to facility by ems  The following physician request were entered in Epic:   Additional Comments: [place] [facility] [place], [place], [place]

## 2013-05-30 DIAGNOSIS — R269 Unspecified abnormalities of gait and mobility: Secondary | ICD-10-CM

## 2013-05-30 DIAGNOSIS — Q809 Congenital ichthyosis, unspecified: Secondary | ICD-10-CM

## 2013-05-30 DIAGNOSIS — I5032 Chronic diastolic (congestive) heart failure: Secondary | ICD-10-CM

## 2013-05-30 DIAGNOSIS — G609 Hereditary and idiopathic neuropathy, unspecified: Secondary | ICD-10-CM

## 2013-05-30 DIAGNOSIS — L02619 Cutaneous abscess of unspecified foot: Secondary | ICD-10-CM

## 2013-05-30 DIAGNOSIS — L03119 Cellulitis of unspecified part of limb: Secondary | ICD-10-CM

## 2013-05-30 DIAGNOSIS — L97509 Non-pressure chronic ulcer of other part of unspecified foot with unspecified severity: Secondary | ICD-10-CM

## 2013-06-02 ENCOUNTER — Encounter: Payer: Self-pay | Admitting: *Deleted

## 2013-06-20 ENCOUNTER — Encounter: Payer: Self-pay | Admitting: Vascular Surgery

## 2013-06-21 ENCOUNTER — Ambulatory Visit (INDEPENDENT_AMBULATORY_CARE_PROVIDER_SITE_OTHER): Payer: Self-pay | Admitting: Vascular Surgery

## 2013-06-21 ENCOUNTER — Encounter: Payer: Self-pay | Admitting: Vascular Surgery

## 2013-06-21 VITALS — BP 183/50 | HR 70 | Temp 97.9°F | Ht 63.0 in | Wt 109.0 lb

## 2013-06-21 DIAGNOSIS — L98499 Non-pressure chronic ulcer of skin of other sites with unspecified severity: Principal | ICD-10-CM

## 2013-06-21 DIAGNOSIS — I739 Peripheral vascular disease, unspecified: Secondary | ICD-10-CM

## 2013-06-21 NOTE — Progress Notes (Signed)
[facility] POST OPERATIVE OFFICE NOTE  CC:  F/u for surgery  HPI:  This is a 80 y.o. female who is s/p right BKA on 05/24/13 by Dr. Donnetta Hutching.  She has sone well with this and progressing nicely at rehab.  She has had a little bit of bleeding from the incision at the skin edge.  She states that her preoperative pain has improved.    Allergies  Allergen Reactions  . Latanoprost Anaphylaxis    redness  . Pentoxifylline     Severe SOB, mental confusion per daughter  . Versed [Midazolam] Other (See Comments)     hallucinations per daughter  . Warfarin Sodium Rash  . Alphagan P [Brimonidine Tartrate] Photosensitivity    Redness bilateral eye  . Oxycodone Hcl Itching  . Avelox [Moxifloxacin Hcl In Nacl] Other (See Comments)    tongue swelling  . Clonidine Derivatives Other (See Comments)    Pt's daughter states she has severe hallucinations  . Dilaudid [Hydromorphone Hcl] Nausea And Vomiting    IV formulation only  . Diltiazem Hcl Hives  . Doxycycline Nausea And Vomiting  . Hydralazine Itching  . Meloxicam Other (See Comments)    Odd feeling  . Moxifloxacin Other (See Comments)     Tongue swelling; mouth ulcers; confusion  . Contrast Media [Iodinated Diagnostic Agents] Rash    Contrast dye.  . Iron Rash    Current Outpatient Prescriptions  Medication Sig Dispense Refill  . ALPRAZolam (XANAX) 0.25 MG tablet Take 1 tablet (0.25 mg total) by mouth at bedtime as needed for sleep.  20 tablet  0  . amLODipine (NORVASC) 10 MG tablet Take 1 tablet (10 mg total) by mouth daily.  30 tablet  11  . aspirin EC 81 MG tablet Take 81 mg by mouth daily.      . Cholecalciferol (VITAMIN D) 2000 UNITS tablet Take 4,000 Units by mouth daily at 12 noon.       . clopidogrel (PLAVIX) 75 MG tablet Take 75 mg by mouth daily.      Marland Kitchen docusate sodium (COLACE) 100 MG capsule Take 200 mg by mouth daily.       . dorzolamide (TRUSOPT) 2 % ophthalmic solution Place 1 drop into the left eye 2 (two)  times daily.  10 mL  11  . fentaNYL (DURAGESIC) 25 MCG/HR patch Place 1 patch (25 mcg total) onto the skin every 3 (three) days.  5 patch  0  . gabapentin (NEURONTIN) 100 MG capsule Take 1 capsule (100 mg total) by mouth at bedtime.  30 capsule  11  . HYDROcodone-acetaminophen (NORCO/VICODIN) 5-325 MG per tablet Take 2 tablets by mouth every 6 (six) hours as needed for moderate pain.  40 tablet  0  . lisinopril (PRINIVIL,ZESTRIL) 20 MG tablet Take 1 tablet (20 mg total) by mouth 2 (two) times daily.  60 tablet  0  . loteprednol (LOTEMAX) 0.5 % ophthalmic suspension Place 1 drop into both eyes daily.      . metoprolol succinate (TOPROL XL) 25 MG 24 hr tablet Take 0.5 tablets (12.5 mg total) by mouth daily.  30 tablet  0  . Multiple Vitamin (MULTIVITAMIN WITH MINERALS) TABS tablet Take 1 tablet by mouth daily at 12 noon.       Marland Kitchen oxyCODONE-acetaminophen (PERCOCET/ROXICET) 5-325 MG per tablet Take 1 tablet by mouth every 6 (six) hours as needed.  30 tablet  0  . polyethylene glycol (MIRALAX / GLYCOLAX) packet Take 17 g by mouth daily.  14 each  0  . potassium chloride SA (K-DUR,KLOR-CON) 20 MEQ tablet Take 1 tablet (20 mEq total) by mouth daily.  30 tablet  0  . pyridOXINE (B-6) 100 MG tablet Take 1 tablet (100 mg total) by mouth daily.  30 tablet  0  . vitamin C (ASCORBIC ACID) 500 MG tablet Take 500 mg by mouth daily at 12 noon.        No current facility-administered medications for this visit.     ROS:  See HPI  Physical Exam:  Filed Vitals:    Incision:  Healing nicely.  She does have an area of eschar from the middle of the incision laterally with some bright red bloody ooze.  Extremities:  Good ROM and can straighten her RLE   A/P:  This is a 80 y.o. female here for f/u to her right BKA on 05/24/13 by Dr. Donnetta Hutching  -she is progressing quite nicely with rehab -will get the staples out from the medial aspect of incision.  She does have an eschar laterally on the incision with some bloody  ooze.  Will have her come back in 2 weeks to have the rest of the staples removed.   -she is okay to proceed with stump shrinker   Leontine Locket, PA-C [facility] [phone]  Clinic MD:  Pt seen and examined with Dr. Donnetta Hutching

## 2013-06-22 ENCOUNTER — Encounter (HOSPITAL_COMMUNITY): Payer: Medicare Other

## 2013-06-22 ENCOUNTER — Ambulatory Visit: Payer: Medicare Other | Admitting: Vascular Surgery

## 2013-06-28 ENCOUNTER — Encounter: Payer: Medicare Other | Admitting: Vascular Surgery

## 2013-07-04 ENCOUNTER — Encounter: Payer: Self-pay | Admitting: Vascular Surgery

## 2013-07-05 ENCOUNTER — Ambulatory Visit (INDEPENDENT_AMBULATORY_CARE_PROVIDER_SITE_OTHER): Payer: Self-pay | Admitting: Vascular Surgery

## 2013-07-05 ENCOUNTER — Encounter: Payer: Self-pay | Admitting: Vascular Surgery

## 2013-07-05 ENCOUNTER — Encounter: Payer: Medicare Other | Admitting: Vascular Surgery

## 2013-07-05 VITALS — BP 148/46 | HR 70 | Ht 63.0 in | Wt 112.0 lb

## 2013-07-05 DIAGNOSIS — L98499 Non-pressure chronic ulcer of skin of other sites with unspecified severity: Principal | ICD-10-CM

## 2013-07-05 DIAGNOSIS — I739 Peripheral vascular disease, unspecified: Secondary | ICD-10-CM

## 2013-07-05 NOTE — Progress Notes (Signed)
.   A for followup of her right below-knee amputation on 05/25/2013. She is here today with her 2 daughters. She has done extremely well in rehabilitation. She is working with weights on her amputation site and also or other sites. She is able to straighten her stump without any difficulty whatsoever reports no pain.  She did have a the lateral half of the sutures removed 2 weeks ago. Her wound is healing nicely 2 other remaining sutures removed today. I will see her again in one month for final followup. She will see Dr. Scot Dock as scheduled in November 2015 for carotid and left leg ABIs to

## 2013-07-18 ENCOUNTER — Ambulatory Visit (INDEPENDENT_AMBULATORY_CARE_PROVIDER_SITE_OTHER): Payer: Medicare Other | Admitting: *Deleted

## 2013-07-18 ENCOUNTER — Telehealth: Payer: Self-pay | Admitting: *Deleted

## 2013-07-18 DIAGNOSIS — I442 Atrioventricular block, complete: Secondary | ICD-10-CM

## 2013-07-18 DIAGNOSIS — I4891 Unspecified atrial fibrillation: Secondary | ICD-10-CM | POA: Diagnosis not present

## 2013-07-18 LAB — MDC_IDC_ENUM_SESS_TYPE_INCLINIC
Brady Statistic RA Percent Paced: 0 %
Brady Statistic RV Percent Paced: 99 %
Date Time Interrogation Session: 20150601040000
Implantable Pulse Generator Serial Number: 137331
Lead Channel Pacing Threshold Amplitude: 0.4 V
Lead Channel Pacing Threshold Pulse Width: 0.5 ms
Lead Channel Setting Pacing Amplitude: 2.4 V
MDC IDC MSMT LEADCHNL RV IMPEDANCE VALUE: 440 Ohm
MDC IDC SET LEADCHNL RV PACING PULSEWIDTH: 0.5 ms
MDC IDC SET LEADCHNL RV SENSING SENSITIVITY: 2.5 mV

## 2013-07-18 NOTE — Telephone Encounter (Signed)
Daughter called to report that Mrs. Fontanez's BKA incision has opened on the lateral side, approximately 2 inches. Wound is draining and appears to be tracking down per the daughter, who is a Marine scientist. Patient is afebrile and the drainage is not purulent. Patient has not gotten her prosthesis yet but has recently been switched to a tighter stump shrinker. We will make her an appt to see our NP on Wednesday at the daughter's request because Dr. Scot Dock is also in the office that day. If the patient gets worse tomorrow, daughter will call us and we will bring her in with [facility] asap. She voiced agreement and understanding of this plan.

## 2013-07-18 NOTE — Telephone Encounter (Signed)
Message sent to Dr. Kyla Balzarine office regarding the stop of Eliquis 3 days prior to aortogram on 07-26-13 by Dr. Trula Slade.

## 2013-07-18 NOTE — Progress Notes (Signed)
Pacemaker check in clinic. Normal device function. Threshold and impedance consistent with previous measurements. Device programmed to maximize longevity. Permanent AF + ASA/Plavix. No high ventricular rates noted. Device programmed at appropriate safety margins. Histogram distribution appropriate for patient activity level. Device programmed to optimize intrinsic conduction. Estimated longevity >5 years. Patient will follow up with [facility] on 10-6.

## 2013-07-19 ENCOUNTER — Encounter: Payer: Self-pay | Admitting: Family

## 2013-07-19 ENCOUNTER — Ambulatory Visit (INDEPENDENT_AMBULATORY_CARE_PROVIDER_SITE_OTHER): Payer: Self-pay | Admitting: Family

## 2013-07-19 VITALS — BP 160/61 | HR 70 | Temp 97.4°F | Resp 16 | Ht 64.0 in | Wt 111.0 lb

## 2013-07-19 DIAGNOSIS — T148XXA Other injury of unspecified body region, initial encounter: Secondary | ICD-10-CM

## 2013-07-19 DIAGNOSIS — L24A9 Irritant contact dermatitis due friction or contact with other specified body fluids: Secondary | ICD-10-CM | POA: Insufficient documentation

## 2013-07-19 DIAGNOSIS — T8189XA Other complications of procedures, not elsewhere classified, initial encounter: Secondary | ICD-10-CM

## 2013-07-19 NOTE — Progress Notes (Signed)
[facility] [place] HISTORY AND PHYSICAL -PAD  History of Present Illness Regina Hamilton is a 80 y.o. female patient that Dr. Donnetta Hutching saw on 07/05/13 for followup of her right below-knee amputation on 05/25/2013. She was accompanied by her 2 daughters. She has done extremely well in rehabilitation. She is working with weights on her amputation site. She is able to straighten her stump without any difficulty whatsoever, reports no pain.  She did have a the lateral half of the sutures removed 2 weeks before she saw Dr. Donnetta Hutching. Her wound was healing nicely at that time, 2 other remaining sutures were removed at that time.Dr. Donnetta Hutching planned to see her again in one month for followup. She will see Dr. Scot Dock as scheduled in November 2015 for carotid Duplex and left leg ABIs. She returns today for c/o stump site wound open and draining.   Past Medical History  Diagnosis Date  . Peripheral vascular disease   . Carotid artery occlusion   . Hypertension   . Hyperlipidemia   . Arthritis   . Pacemaker 2008    [facility] in [place], [place]  . Complication of anesthesia   . Dysrhythmia     atrial fibrilation  . Anxiety   . Chronic venous insufficiency     B  . Atrioventricular block, complete s/p AV junction ablation 11/19/2012  . Neuromuscular disorder     periphreal neuroptahty  . Ischemia of right BKA site 05/26/2013    Social History History  Substance Use Topics  . Smoking status: Never Smoker   . Smokeless tobacco: Never Used  . Alcohol Use: No    Family History Family History  Problem Relation Age of Onset  . Heart disease Mother     Heart disease before age 11  . Hypertension Mother   . Varicose Veins Mother   . Peripheral vascular disease Mother   . Other Mother     amputation  . Heart disease Father     Heat Diease before age 60  . Heart disease Maternal Grandmother   . Heart disease Paternal Grandmother   . Diabetes Sister   . Hypertension  Sister   . Varicose Veins Sister   . Diabetes Brother   . Hypertension Brother   . Heart attack Brother   . Hypertension Daughter     Past Surgical History  Procedure Laterality Date  . Spine surgery  1970    Cervical fusion  . Cardio ablation- pacemaker    . Pr vein bypass graft,aorto-fem-pop    . Total hip revision      bilateral  . Cholecystectomy  05/20/11  . Angioplasty  03/01/2012  . Insert / replace / remove pacemaker    . Corneal transplant Bilateral   . Back surgery    . Abdominal aortagram  Feb. 16, 2015  . Amputation Right 05/25/2013    Procedure: AMPUTATION BELOW KNEE;  Surgeon: Rosetta Posner, MD;  Location: [facility];  Service: Vascular;  Laterality: Right;    Allergies  Allergen Reactions  . Latanoprost Anaphylaxis    redness  . Pentoxifylline     Severe SOB, mental confusion per daughter  . Versed [Midazolam] Other (See Comments)     hallucinations per daughter  . Warfarin Sodium Rash  . Alphagan P [Brimonidine Tartrate] Photosensitivity    Redness bilateral eye  . Oxycodone Hcl Itching  . Avelox [Moxifloxacin Hcl In Nacl] Other (See Comments)    tongue swelling  . Clonidine Derivatives  Other (See Comments)    Pt's daughter states she has severe hallucinations  . Dilaudid [Hydromorphone Hcl] Nausea And Vomiting    IV formulation only  . Diltiazem Hcl Hives  . Doxycycline Nausea And Vomiting  . Hydralazine Itching  . Meloxicam Other (See Comments)    Odd feeling  . Moxifloxacin Other (See Comments)     Tongue swelling; mouth ulcers; confusion  . Contrast Media [Iodinated Diagnostic Agents] Rash    Contrast dye.  . Iron Rash    Current Outpatient Prescriptions  Medication Sig Dispense Refill  . acetaminophen (TYLENOL) 500 MG chewable tablet Chew 1,000 mg by mouth every 6 (six) hours as needed for pain.      Marland Kitchen ALPRAZolam (XANAX) 0.25 MG tablet Take 1 tablet (0.25 mg total) by mouth at bedtime as needed for sleep.  20 tablet  0  . amLODipine (NORVASC) 10  MG tablet Take 1 tablet (10 mg total) by mouth daily.  30 tablet  11  . aspirin EC 81 MG tablet Take 81 mg by mouth daily.      . bisacodyl (DULCOLAX) 10 MG suppository Place 10 mg rectally as needed for moderate constipation.      . Cholecalciferol (VITAMIN D) 2000 UNITS tablet Take 4,000 Units by mouth daily at 12 noon.       . clopidogrel (PLAVIX) 75 MG tablet Take 75 mg by mouth daily.      . cyclobenzaprine (FLEXERIL) 5 MG tablet Take 5 mg by mouth 2 (two) times daily as needed for muscle spasms.      Marland Kitchen docusate sodium (COLACE) 100 MG capsule Take 200 mg by mouth daily.       . dorzolamide (TRUSOPT) 2 % ophthalmic solution Place 1 drop into the left eye 2 (two) times daily.  10 mL  11  . fentaNYL (DURAGESIC) 25 MCG/HR patch Place 1 patch (25 mcg total) onto the skin every 3 (three) days.  5 patch  0  . ferrous sulfate 325 (65 FE) MG tablet Take 325 mg by mouth daily with breakfast.      . gabapentin (NEURONTIN) 100 MG capsule Take 100 mg by mouth 3 (three) times daily.      Marland Kitchen lisinopril (PRINIVIL,ZESTRIL) 20 MG tablet Take 1 tablet (20 mg total) by mouth 2 (two) times daily.  60 tablet  0  . loteprednol (LOTEMAX) 0.5 % ophthalmic suspension Place 1 drop into both eyes daily.      . magnesium hydroxide (MILK OF MAGNESIA) 400 MG/5 ML SUSP Take 30 mLs by mouth daily as needed.      . metoprolol succinate (TOPROL XL) 25 MG 24 hr tablet Take 0.5 tablets (12.5 mg total) by mouth daily.  30 tablet  0  . Multiple Vitamin (MULTIVITAMIN WITH MINERALS) TABS tablet Take 1 tablet by mouth daily at 12 noon.       Marland Kitchen oxyCODONE-acetaminophen (PERCOCET/ROXICET) 5-325 MG per tablet Take 1 tablet by mouth every 6 (six) hours as needed.  30 tablet  0  . polyethylene glycol (MIRALAX / GLYCOLAX) packet Take 17 g by mouth daily.  14 each  0  . potassium chloride SA (K-DUR,KLOR-CON) 20 MEQ tablet Take 1 tablet (20 mEq total) by mouth daily.  30 tablet  0  . psyllium (METAMUCIL) 58.6 % powder Take 1 packet by mouth as  needed.      . pyridOXINE (B-6) 100 MG tablet Take 1 tablet (100 mg total) by mouth daily.  30 tablet  0  .  vitamin C (ASCORBIC ACID) 500 MG tablet Take 500 mg by mouth daily at 12 noon.        No current facility-administered medications for this visit.    ROS: See HPI for pertinent positives and negatives.   Physical Examination  Filed Vitals:   07/19/13 0939  BP: 160/61  Pulse: 70  Temp: 97.4 F (36.3 C)  TempSrc: Oral  Resp: 16  Height: 5\' 4"  (1.626 m)  Weight: 111 lb (50.349 kg)  SpO2: 100%  Body mass index is 19.04 kg/(m^2).   General: A&O x 3, WDW2N. Gait: in wheelchair Eyes: no gross abnormalities  Pulmonary: respirations are non labored                     VASCULAR EXAM: Extremities without ischemic changes  without Gangrene; with open wound at right stump BKA site, no drainage, no redness, no swelling, no purulence.                                                                                                     Skin: see extremities Musculoskeletal:right BKA  Neurologic: A&O X 3; Appropriate Affect ; SENSATION: normal; MOTOR FUNCTION:  moving all extremities equally, motor strength 5/5 throughout. Speech is fluent/normal. CN 2-12 intact.   ASSESSMENT: Regina Hamilton is a 80 y.o. female who is s/p right below-knee amputation on 05/25/2013. Fibrinous tissue debrided by Dr. Kellie Simmering from the right stump open incision, no purulence, no signs of cellulitis, no fluctuance.  Twice daily wet to dry NS dressing changes to open inision at right stump.  PLAN:   Wet to dry NS dressing changes to open right stump incision twice daily at the rehab center.  Based on the patient's vascular studies and examination, pt will return to clinic in 2 weeks with Dr. Donnetta Hutching.  The patient was given information about PAD including signs, symptoms, treatment, what symptoms should prompt the patient to seek immediate medical care, and risk reduction measures to take.  Clemon Chambers, RN, MSN, FNP-C [facility] Phone: [phone]  Clinic MD: Kellie Simmering  07/19/2013 9:22 AM

## 2013-07-19 NOTE — Patient Instructions (Signed)
Amputation Many new amputations occur each year. The most common causes of amputation of the lower extremity (the hip down) are:  Disease.  Injury caused in an accidents or wars (trauma).  Birth defects.  Lumps (tumors) that are cancer. Upper extremity amputation is usually the result of trauma or birth defect, with disease being a less common cause. COMMON PROBLEMS After an amputation a number of issues need to be considered. Getting around and self-care are early problems that must be dealt with. A complete rehabilitation program will help the amputee recover mobility. A team approach of caregivers helps the most. Caregivers that can provide a well rounded program include:   Physicians.  Therapists.  Nurses.  Social workers.  Psychologists. Usually there are problems with body image and coping with lifestyle changes. A grieving period similar to dealing with a death in the family is common after an amputation. Talking to a trained professional with experience in treating people with similar problems can be very helpful. When returning to a previous lifestyle, questions about sexuality can arise. Many of these uncertainties are normal. These can be discussed with your psychologist or rehabilitation specialist. REHABILITATION AND RETURN TO WORK AND ACTIVITIES Returning to recreational activities and employment are part of recovery. Many times, changes to recreation equipment can allow return to a sport or hobby. A device that substitutes the missing part of the body is called a prosthetic. Many prosthetic manufacturers produce components designed for sports. Be sure to discuss all of your leisure interests with your prosthetist. This is the person who helps provide you with custom made replacement limbs. Your physician will also help to select a prosthetic that will meet your needs. Employers will vary in their willingness to change a work environment in order to help people with  disabilities. Your therapists can perform job site evaluations. Your therapist can then make recommendations to help with your work area. Some amputees will not be able to return to previous jobs. Your local Office of Vocational Rehabilitation can assist you in job retraining.  Once you are past the initial rehabilitation stage you will have ongoing contact with caregivers and a prosthetist. You need to work closely with them in making decisions about your prosthetic device. PROGNOSIS  Amputation should not end your joy of life. There are people with limb loss in nearly all walks of life. They are in a wide variety of professions. They participate in nearly all sports. Ask your caregivers about support groups and sports organizations in your area. They can help you with referral to organizations that will be helpful to you. Document Released: 10/26/2001 Document Revised: 04/28/2011 Document Reviewed: 12/21/2006 [facility] Patient Information 2014 [place], [place].

## 2013-07-20 ENCOUNTER — Ambulatory Visit: Payer: Medicare Other | Admitting: Family

## 2013-07-25 ENCOUNTER — Encounter: Payer: Self-pay | Admitting: Vascular Surgery

## 2013-07-26 ENCOUNTER — Ambulatory Visit: Payer: Medicare Other | Admitting: Vascular Surgery

## 2013-08-01 ENCOUNTER — Encounter: Payer: Self-pay | Admitting: Vascular Surgery

## 2013-08-02 ENCOUNTER — Encounter: Payer: Self-pay | Admitting: Vascular Surgery

## 2013-08-02 ENCOUNTER — Encounter (HOSPITAL_COMMUNITY): Payer: Self-pay | Admitting: General Practice

## 2013-08-02 ENCOUNTER — Inpatient Hospital Stay (HOSPITAL_COMMUNITY)
Admission: AD | Admit: 2013-08-02 | Discharge: 2013-08-06 | DRG: 464 | Disposition: A | Discharge status: Skilled Nursing Facility | Payer: Medicare Other | Source: Ambulatory Visit | Attending: Vascular Surgery | Admitting: Vascular Surgery

## 2013-08-02 ENCOUNTER — Ambulatory Visit (INDEPENDENT_AMBULATORY_CARE_PROVIDER_SITE_OTHER): Payer: Self-pay | Admitting: Vascular Surgery

## 2013-08-02 VITALS — BP 135/40 | HR 70 | Temp 97.9°F | Ht 64.0 in | Wt 111.0 lb

## 2013-08-02 DIAGNOSIS — T8189XA Other complications of procedures, not elsewhere classified, initial encounter: Secondary | ICD-10-CM

## 2013-08-02 DIAGNOSIS — L24A9 Irritant contact dermatitis due friction or contact with other specified body fluids: Secondary | ICD-10-CM

## 2013-08-02 DIAGNOSIS — T148XXA Other injury of unspecified body region, initial encounter: Secondary | ICD-10-CM

## 2013-08-02 DIAGNOSIS — F3289 Other specified depressive episodes: Secondary | ICD-10-CM | POA: Diagnosis present

## 2013-08-02 DIAGNOSIS — G8929 Other chronic pain: Secondary | ICD-10-CM | POA: Diagnosis present

## 2013-08-02 DIAGNOSIS — G5790 Unspecified mononeuropathy of unspecified lower limb: Secondary | ICD-10-CM | POA: Diagnosis present

## 2013-08-02 DIAGNOSIS — Z950 Presence of cardiac pacemaker: Secondary | ICD-10-CM

## 2013-08-02 DIAGNOSIS — T8740 Infection of amputation stump, unspecified extremity: Principal | ICD-10-CM | POA: Diagnosis present

## 2013-08-02 DIAGNOSIS — I5032 Chronic diastolic (congestive) heart failure: Secondary | ICD-10-CM | POA: Diagnosis present

## 2013-08-02 DIAGNOSIS — I509 Heart failure, unspecified: Secondary | ICD-10-CM | POA: Diagnosis present

## 2013-08-02 DIAGNOSIS — I442 Atrioventricular block, complete: Secondary | ICD-10-CM | POA: Diagnosis present

## 2013-08-02 DIAGNOSIS — Z7902 Long term (current) use of antithrombotics/antiplatelets: Secondary | ICD-10-CM

## 2013-08-02 DIAGNOSIS — F329 Major depressive disorder, single episode, unspecified: Secondary | ICD-10-CM | POA: Diagnosis present

## 2013-08-02 DIAGNOSIS — I10 Essential (primary) hypertension: Secondary | ICD-10-CM | POA: Diagnosis present

## 2013-08-02 DIAGNOSIS — Z96649 Presence of unspecified artificial hip joint: Secondary | ICD-10-CM

## 2013-08-02 DIAGNOSIS — I739 Peripheral vascular disease, unspecified: Secondary | ICD-10-CM | POA: Diagnosis present

## 2013-08-02 DIAGNOSIS — M1990 Unspecified osteoarthritis, unspecified site: Secondary | ICD-10-CM | POA: Diagnosis present

## 2013-08-02 DIAGNOSIS — M549 Dorsalgia, unspecified: Secondary | ICD-10-CM | POA: Diagnosis present

## 2013-08-02 DIAGNOSIS — I4891 Unspecified atrial fibrillation: Secondary | ICD-10-CM | POA: Diagnosis present

## 2013-08-02 DIAGNOSIS — Z85828 Personal history of other malignant neoplasm of skin: Secondary | ICD-10-CM

## 2013-08-02 DIAGNOSIS — G4730 Sleep apnea, unspecified: Secondary | ICD-10-CM | POA: Diagnosis present

## 2013-08-02 DIAGNOSIS — F411 Generalized anxiety disorder: Secondary | ICD-10-CM | POA: Diagnosis present

## 2013-08-02 DIAGNOSIS — Z7982 Long term (current) use of aspirin: Secondary | ICD-10-CM

## 2013-08-02 DIAGNOSIS — Y835 Amputation of limb(s) as the cause of abnormal reaction of the patient, or of later complication, without mention of misadventure at the time of the procedure: Secondary | ICD-10-CM | POA: Diagnosis present

## 2013-08-02 DIAGNOSIS — E785 Hyperlipidemia, unspecified: Secondary | ICD-10-CM | POA: Diagnosis present

## 2013-08-02 DIAGNOSIS — Z79899 Other long term (current) drug therapy: Secondary | ICD-10-CM

## 2013-08-02 DIAGNOSIS — I6529 Occlusion and stenosis of unspecified carotid artery: Secondary | ICD-10-CM | POA: Diagnosis present

## 2013-08-02 HISTORY — DX: Scoliosis, unspecified: M41.9

## 2013-08-02 HISTORY — DX: Dorsalgia, unspecified: M54.9

## 2013-08-02 HISTORY — DX: Other chronic pain: G89.29

## 2013-08-02 HISTORY — DX: Major depressive disorder, single episode, unspecified: F32.9

## 2013-08-02 HISTORY — DX: Basal cell carcinoma of skin, unspecified: C44.91

## 2013-08-02 HISTORY — DX: Spinal stenosis, lumbar region without neurogenic claudication: M48.061

## 2013-08-02 HISTORY — DX: Calculus of kidney: N20.0

## 2013-08-02 HISTORY — DX: Depression, unspecified: F32.A

## 2013-08-02 HISTORY — DX: Peripheral vascular disease, unspecified: I73.9

## 2013-08-02 LAB — COMPREHENSIVE METABOLIC PANEL
ALT: 12 U/L (ref 0–35)
AST: 20 U/L (ref 0–37)
Albumin: 2.8 g/dL — ABNORMAL LOW (ref 3.5–5.2)
Alkaline Phosphatase: 88 U/L (ref 39–117)
BILIRUBIN TOTAL: 0.3 mg/dL (ref 0.3–1.2)
BUN: 34 mg/dL — ABNORMAL HIGH (ref 6–23)
CHLORIDE: 99 meq/L (ref 96–112)
CO2: 20 meq/L (ref 19–32)
CREATININE: 1.03 mg/dL (ref 0.50–1.10)
Calcium: 9.4 mg/dL (ref 8.4–10.5)
GFR calc Af Amer: 55 mL/min — ABNORMAL LOW (ref >90)
GFR, EST NON AFRICAN AMERICAN: 47 mL/min — AB (ref >90)
GLUCOSE: 82 mg/dL (ref 70–99)
Potassium: 4.5 mEq/L (ref 3.7–5.3)
Sodium: 135 mEq/L — ABNORMAL LOW (ref 137–147)
Total Protein: 6.4 g/dL (ref 6.0–8.3)

## 2013-08-02 LAB — CBC
HEMATOCRIT: 31 % — AB (ref 36.0–46.0)
Hemoglobin: 10.1 g/dL — ABNORMAL LOW (ref 12.0–15.0)
MCH: 27.7 pg (ref 26.0–34.0)
MCHC: 32.6 g/dL (ref 30.0–36.0)
MCV: 85.2 fL (ref 78.0–100.0)
Platelets: 226 10*3/uL (ref 150–400)
RBC: 3.64 MIL/uL — ABNORMAL LOW (ref 3.87–5.11)
RDW: 14.4 % (ref 11.5–15.5)
WBC: 7.9 10*3/uL (ref 4.0–10.5)

## 2013-08-02 LAB — PROTIME-INR
INR: 1.02 (ref 0.00–1.49)
Prothrombin Time: 13.2 seconds (ref 11.6–15.2)

## 2013-08-02 LAB — SURGICAL PCR SCREEN
MRSA, PCR: NEGATIVE
Staphylococcus aureus: NEGATIVE

## 2013-08-02 MED ORDER — PSYLLIUM 95 % PO PACK
1.0000 | PACK | Freq: Every day | ORAL | Status: DC
  Administered 2013-08-04 – 2013-08-05 (×2): 1 via ORAL
  Filled 2013-08-02 (×5): qty 1

## 2013-08-02 MED ORDER — CYCLOBENZAPRINE HCL 10 MG PO TABS: 5.0000 mg | ORAL_TABLET | Freq: Two times a day (BID) | ORAL | Status: DC | PRN

## 2013-08-02 MED ORDER — PHENOL 1.4 % MT LIQD
1.0000 | OROMUCOSAL | Status: DC | PRN
  Filled 2013-08-02: qty 177

## 2013-08-02 MED ORDER — AMLODIPINE BESYLATE 10 MG PO TABS
10.0000 mg | ORAL_TABLET | Freq: Every day | ORAL | Status: DC
  Administered 2013-08-04 – 2013-08-06 (×3): 10 mg via ORAL
  Filled 2013-08-02 (×5): qty 1

## 2013-08-02 MED ORDER — DOCUSATE SODIUM 100 MG PO CAPS
200.0000 mg | ORAL_CAPSULE | Freq: Every day | ORAL | Status: DC
  Administered 2013-08-02 – 2013-08-06 (×4): 200 mg via ORAL
  Filled 2013-08-02 (×5): qty 2

## 2013-08-02 MED ORDER — OXYCODONE-ACETAMINOPHEN 5-325 MG PO TABS
1.0000 | ORAL_TABLET | Freq: Four times a day (QID) | ORAL | Status: DC | PRN
  Administered 2013-08-02 – 2013-08-05 (×4): 1 via ORAL
  Filled 2013-08-02 (×4): qty 1

## 2013-08-02 MED ORDER — BISACODYL 10 MG RE SUPP: 10.0000 mg | RECTAL | Status: DC | PRN

## 2013-08-02 MED ORDER — OXYCODONE-ACETAMINOPHEN 5-325 MG PO TABS: 1.0000 | ORAL_TABLET | Freq: Four times a day (QID) | ORAL | Status: DC | PRN

## 2013-08-02 MED ORDER — SULFAMETHOXAZOLE-TRIMETHOPRIM 800-160 MG PO TABS: 1.0000 | ORAL_TABLET | Freq: Two times a day (BID) | ORAL | Status: DC

## 2013-08-02 MED ORDER — ONDANSETRON HCL 4 MG/2ML IJ SOLN: 4.0000 mg | Freq: Four times a day (QID) | INTRAMUSCULAR | Status: DC | PRN

## 2013-08-02 MED ORDER — LISINOPRIL 20 MG PO TABS
20.0000 mg | ORAL_TABLET | Freq: Two times a day (BID) | ORAL | Status: DC
  Administered 2013-08-02 – 2013-08-06 (×7): 20 mg via ORAL
  Filled 2013-08-02 (×10): qty 1

## 2013-08-02 MED ORDER — ALPRAZOLAM 0.25 MG PO TABS: 0.2500 mg | ORAL_TABLET | Freq: Every evening | ORAL | Status: DC | PRN

## 2013-08-02 MED ORDER — VITAMIN B-6 100 MG PO TABS
100.0000 mg | ORAL_TABLET | Freq: Every day | ORAL | Status: DC
  Administered 2013-08-04 – 2013-08-06 (×3): 100 mg via ORAL
  Filled 2013-08-02 (×5): qty 1

## 2013-08-02 MED ORDER — SODIUM CHLORIDE 0.9 % IV SOLN
INTRAVENOUS | Status: DC
  Administered 2013-08-02 – 2013-08-03 (×3): via INTRAVENOUS

## 2013-08-02 MED ORDER — VITAMIN D 50 MCG (2000 UT) PO TABS
4000.0000 [IU] | ORAL_TABLET | Freq: Every day | ORAL | Status: DC
  Administered 2013-08-04 – 2013-08-06 (×3): 4000 [IU] via ORAL
  Filled 2013-08-02 (×5): qty 2

## 2013-08-02 MED ORDER — VITAMIN C 500 MG PO TABS
500.0000 mg | ORAL_TABLET | Freq: Every day | ORAL | Status: DC
  Administered 2013-08-04 – 2013-08-06 (×3): 500 mg via ORAL
  Filled 2013-08-02 (×5): qty 1

## 2013-08-02 MED ORDER — POLYETHYLENE GLYCOL 3350 17 G PO PACK
17.0000 g | PACK | Freq: Every day | ORAL | Status: DC
  Filled 2013-08-02 (×5): qty 1

## 2013-08-02 MED ORDER — PSYLLIUM 58.6 % PO POWD: 1.0000 | Freq: Every day | ORAL | Status: DC

## 2013-08-02 MED ORDER — MAGNESIUM HYDROXIDE NICU ORAL SYRINGE 400 MG/5 ML
30.0000 mL | Freq: Every day | ORAL | Status: DC | PRN
  Filled 2013-08-02: qty 30

## 2013-08-02 MED ORDER — GABAPENTIN 100 MG PO CAPS
100.0000 mg | ORAL_CAPSULE | Freq: Three times a day (TID) | ORAL | Status: DC
  Administered 2013-08-02 – 2013-08-06 (×11): 100 mg via ORAL
  Filled 2013-08-02 (×14): qty 1

## 2013-08-02 MED ORDER — VANCOMYCIN HCL 500 MG IV SOLR
500.0000 mg | INTRAVENOUS | Status: DC
  Filled 2013-08-02 (×2): qty 500

## 2013-08-02 MED ORDER — PANTOPRAZOLE SODIUM 40 MG PO TBEC
40.0000 mg | DELAYED_RELEASE_TABLET | Freq: Every day | ORAL | Status: DC
  Administered 2013-08-02: 40 mg via ORAL
  Filled 2013-08-02: qty 1

## 2013-08-02 MED ORDER — LOTEPREDNOL ETABONATE 0.5 % OP SUSP
1.0000 [drp] | Freq: Every day | OPHTHALMIC | Status: DC
  Administered 2013-08-04 – 2013-08-06 (×3): 1 [drp] via OPHTHALMIC
  Filled 2013-08-02: qty 5

## 2013-08-02 MED ORDER — ACETAMINOPHEN 325 MG PO TABS: 325.0000 mg | ORAL_TABLET | ORAL | Status: DC | PRN

## 2013-08-02 MED ORDER — POTASSIUM CHLORIDE CRYS ER 20 MEQ PO TBCR: 20.0000 meq | EXTENDED_RELEASE_TABLET | Freq: Once | ORAL | Status: AC

## 2013-08-02 MED ORDER — DORZOLAMIDE HCL 2 % OP SOLN
1.0000 [drp] | Freq: Two times a day (BID) | OPHTHALMIC | Status: DC
  Administered 2013-08-02 – 2013-08-06 (×7): 1 [drp] via OPHTHALMIC
  Filled 2013-08-02: qty 10

## 2013-08-02 MED ORDER — FERROUS SULFATE 325 (65 FE) MG PO TABS
325.0000 mg | ORAL_TABLET | Freq: Every day | ORAL | Status: DC
  Administered 2013-08-04 – 2013-08-06 (×3): 325 mg via ORAL
  Filled 2013-08-02 (×5): qty 1

## 2013-08-02 MED ORDER — PIPERACILLIN-TAZOBACTAM 3.375 G IVPB
3.3750 g | Freq: Three times a day (TID) | INTRAVENOUS | Status: DC
  Administered 2013-08-02 – 2013-08-06 (×11): 3.375 g via INTRAVENOUS
  Filled 2013-08-02 (×16): qty 50

## 2013-08-02 MED ORDER — VANCOMYCIN HCL IN DEXTROSE 750-5 MG/150ML-% IV SOLN
750.0000 mg | Freq: Once | INTRAVENOUS | Status: AC
  Administered 2013-08-02: 750 mg via INTRAVENOUS
  Filled 2013-08-02: qty 150

## 2013-08-02 MED ORDER — ASPIRIN EC 81 MG PO TBEC
81.0000 mg | DELAYED_RELEASE_TABLET | Freq: Every day | ORAL | Status: DC
  Administered 2013-08-04 – 2013-08-06 (×3): 81 mg via ORAL
  Filled 2013-08-02 (×5): qty 1

## 2013-08-02 MED ORDER — ALPRAZOLAM 0.25 MG PO TABS
0.2500 mg | ORAL_TABLET | Freq: Every evening | ORAL | Status: DC | PRN
  Administered 2013-08-02 – 2013-08-06 (×2): 0.25 mg via ORAL
  Filled 2013-08-02 (×2): qty 1

## 2013-08-02 MED ORDER — ADULT MULTIVITAMIN W/MINERALS CH
1.0000 | ORAL_TABLET | Freq: Every day | ORAL | Status: DC
  Administered 2013-08-04 – 2013-08-06 (×3): 1 via ORAL
  Filled 2013-08-02 (×5): qty 1

## 2013-08-02 MED ORDER — METOPROLOL SUCCINATE 12.5 MG HALF TABLET
12.5000 mg | ORAL_TABLET | Freq: Every day | ORAL | Status: DC
  Administered 2013-08-04 – 2013-08-06 (×3): 12.5 mg via ORAL
  Filled 2013-08-02 (×5): qty 1

## 2013-08-02 MED ORDER — ACETAMINOPHEN 650 MG RE SUPP: 325.0000 mg | RECTAL | Status: DC | PRN

## 2013-08-02 MED ORDER — POTASSIUM CHLORIDE CRYS ER 20 MEQ PO TBCR
20.0000 meq | EXTENDED_RELEASE_TABLET | Freq: Every day | ORAL | Status: DC
  Administered 2013-08-04 – 2013-08-06 (×3): 20 meq via ORAL
  Filled 2013-08-02 (×4): qty 1

## 2013-08-02 MED ORDER — CLOPIDOGREL BISULFATE 75 MG PO TABS
75.0000 mg | ORAL_TABLET | Freq: Every day | ORAL | Status: DC
  Filled 2013-08-02 (×2): qty 1

## 2013-08-02 MED ORDER — FENTANYL 12 MCG/HR TD PT72
25.0000 ug | MEDICATED_PATCH | TRANSDERMAL | Status: DC
  Filled 2013-08-02 (×2): qty 2

## 2013-08-02 NOTE — Progress Notes (Signed)
Patient name: Regina Hamilton MRN: [id number] DOB: 1923/05/14 Sex: female     Reason for referral:  Chief Complaint  Patient presents with  . Re-evaluation    2 wk f/u     HISTORY OF PRESENT ILLNESS: The patient presents today for continued followup of her right below-knee amputation. She had multiple revascularization attempts in the past with nonhealing ulceration in her right foot. She undergo an right below-knee amputation by myself on 05/25/2013. She did well in the hospital was discharged to a nursing facility. She will call our office with good early healing. I had seen her in one month ago with near total healing with removal of her staples and one very small area on the lateral aspect of the incision line could be quite superficial. She presented to our office on 07/19/2013 with some worsening separation. She was seen wound center as well. This is been progressive and she is seen today for further evaluation. She does have a progression of erythema and separation of the wound and tracking under the incision was some necrotic appearing fascia and the base of the wound. She has not had fevers  Past Medical History  Diagnosis Date  . Peripheral vascular disease   . Carotid artery occlusion   . Hypertension   . Hyperlipidemia   . Arthritis   . Pacemaker 2008    [facility] in [place], [place]  . Complication of anesthesia   . Dysrhythmia     atrial fibrilation  . Anxiety   . Chronic venous insufficiency     B  . Atrioventricular block, complete s/p AV junction ablation 11/19/2012  . Neuromuscular disorder     periphreal neuroptahty  . Ischemia of right BKA site 05/26/2013    Past Surgical History  Procedure Laterality Date  . Spine surgery  1970    Cervical fusion  . Cardio ablation- pacemaker    . Pr vein bypass graft,aorto-fem-pop    . Total hip revision      bilateral  . Cholecystectomy  05/20/11  . Angioplasty  03/01/2012  . Insert / replace / remove  pacemaker    . Corneal transplant Bilateral   . Back surgery    . Abdominal aortagram  Feb. 16, 2015  . Amputation Right 05/25/2013    Procedure: AMPUTATION BELOW KNEE;  Surgeon: Rosetta Posner, MD;  Location: [facility];  Service: Vascular;  Laterality: Right;    History   Social History  . Marital Status: Widowed    Spouse Name: N/A    Number of Children: N/A  . Years of Education: N/A   Occupational History  . Not on file.   Social History Main Topics  . Smoking status: Never Smoker   . Smokeless tobacco: Never Used  . Alcohol Use: No  . Drug Use: No  . Sexual Activity: No   Other Topics Concern  . Not on file   Social History Narrative  . No narrative on file    Family History  Problem Relation Age of Onset  . Heart disease Mother     Heart disease before age 67  . Hypertension Mother   . Varicose Veins Mother   . Peripheral vascular disease Mother   . Other Mother     amputation  . Heart disease Father     Heat Diease before age 80  . Heart disease Maternal Grandmother   . Heart disease Paternal Grandmother   . Diabetes Sister   .  Hypertension Sister   . Varicose Veins Sister   . Diabetes Brother   . Hypertension Brother   . Heart attack Brother   . Hypertension Daughter     Allergies as of 08/02/2013 - Review Complete 08/02/2013  Allergen Reaction Noted  . Latanoprost Anaphylaxis 03/31/2013  . Pentoxifylline  03/30/2013  . Versed [midazolam] Other (See Comments) 10/11/2009  . Warfarin sodium Rash 10/16/2009  . Alphagan p [brimonidine tartrate] Photosensitivity 09/10/2011  . Oxycodone hcl Itching 10/22/2011  . Avelox [moxifloxacin hcl in nacl] Other (See Comments) 03/30/2013  . Clonidine derivatives Other (See Comments) 10/22/2011  . Dilaudid [hydromorphone hcl] Nausea And Vomiting 05/24/2013  . Diltiazem hcl Hives 03/21/2011  . Doxycycline Nausea And Vomiting 03/31/2013  . Hydralazine Itching 09/13/2012  . Meloxicam Other (See Comments) 03/21/2011  .  Moxifloxacin Other (See Comments)   . Contrast media [iodinated diagnostic agents] Rash 10/31/2011  . Iron Rash     Current Outpatient Prescriptions on File Prior to Visit  Medication Sig Dispense Refill  . acetaminophen (TYLENOL) 500 MG chewable tablet Chew 1,000 mg by mouth every 6 (six) hours as needed for pain.      Marland Kitchen ALPRAZolam (XANAX) 0.25 MG tablet Take 1 tablet (0.25 mg total) by mouth at bedtime as needed for sleep.  20 tablet  0  . amLODipine (NORVASC) 10 MG tablet Take 1 tablet (10 mg total) by mouth daily.  30 tablet  11  . aspirin EC 81 MG tablet Take 81 mg by mouth daily.      . bisacodyl (DULCOLAX) 10 MG suppository Place 10 mg rectally as needed for moderate constipation.      . Cholecalciferol (VITAMIN D) 2000 UNITS tablet Take 4,000 Units by mouth daily at 12 noon.       . clopidogrel (PLAVIX) 75 MG tablet Take 75 mg by mouth daily.      . cyclobenzaprine (FLEXERIL) 5 MG tablet Take 5 mg by mouth 2 (two) times daily as needed for muscle spasms.      Marland Kitchen docusate sodium (COLACE) 100 MG capsule Take 200 mg by mouth daily.       . dorzolamide (TRUSOPT) 2 % ophthalmic solution Place 1 drop into the left eye 2 (two) times daily.  10 mL  11  . fentaNYL (DURAGESIC) 25 MCG/HR patch Place 1 patch (25 mcg total) onto the skin every 3 (three) days.  5 patch  0  . ferrous sulfate 325 (65 FE) MG tablet Take 325 mg by mouth daily with breakfast.      . gabapentin (NEURONTIN) 100 MG capsule Take 100 mg by mouth 3 (three) times daily.      Marland Kitchen lisinopril (PRINIVIL,ZESTRIL) 20 MG tablet Take 1 tablet (20 mg total) by mouth 2 (two) times daily.  60 tablet  0  . loteprednol (LOTEMAX) 0.5 % ophthalmic suspension Place 1 drop into both eyes daily.      . magnesium hydroxide (MILK OF MAGNESIA) 400 MG/5 ML SUSP Take 30 mLs by mouth daily as needed.      . metoprolol succinate (TOPROL XL) 25 MG 24 hr tablet Take 0.5 tablets (12.5 mg total) by mouth daily.  30 tablet  0  . Multiple Vitamin (MULTIVITAMIN  WITH MINERALS) TABS tablet Take 1 tablet by mouth daily at 12 noon.       Marland Kitchen oxyCODONE-acetaminophen (PERCOCET/ROXICET) 5-325 MG per tablet Take 1 tablet by mouth every 6 (six) hours as needed.  30 tablet  0  . polyethylene glycol (MIRALAX /  GLYCOLAX) packet Take 17 g by mouth daily.  14 each  0  . potassium chloride SA (K-DUR,KLOR-CON) 20 MEQ tablet Take 1 tablet (20 mEq total) by mouth daily.  30 tablet  0  . psyllium (METAMUCIL) 58.6 % powder Take 1 packet by mouth as needed.      . pyridOXINE (B-6) 100 MG tablet Take 1 tablet (100 mg total) by mouth daily.  30 tablet  0  . vitamin C (ASCORBIC ACID) 500 MG tablet Take 500 mg by mouth daily at 12 noon.        No current facility-administered medications on file prior to visit.     REVIEW OF SYSTEMS:  Data her past medical history issues   PHYSICAL EXAMINATION:  General: The patient is a well-nourished female, in no acute distress. Vital signs are BP 135/40  Pulse 70  Temp(Src) 97.9 F (36.6 C) (Oral)  Ht 5\' 4"  (1.626 m)  Wt 111 lb (50.349 kg)  BMI 19.04 kg/m2  SpO2 100% Pulmonary: There is a good air exchange Abdomen: Soft and non-tender  Musculoskeletal: There are no major deformities. Does have a right below-knee amputation Neurologic: No focal weakness or paresthesias are detected, Skin: Erythema and tenderness over the distal portion of her BKA stump. 3 cm open area with necrotic debris in the base Psychiatric: The patient has normal affect. Cardiovascular: No palpable right popliteal pulse     Impression and Plan:  Discussed with the Patient and Her 2 Daughters Present. Unusual presentation with near total healing and now all marked deterioration of her ability of dictation. I have recommended that she be admitted to the hospital today for IV antibiotics and surgery tomorrow for debridement of the below-knee amputation and possible revision. I did explain the option of above-knee dictation with the much higher up  possibility of healing. The patient is adamantly opposed to considering above-knee amputation. I do feel she is a reasonable chance for healing this put him at a loss to explain why this looked so good for nearly 2 months and then deteriorated. We will plan for revision tomorrow and stay understand the possibility of ongoing nonhealing    EARLY, TODD [facility] Office: [phone]

## 2013-08-02 NOTE — Progress Notes (Signed)
ANTIBIOTIC CONSULT NOTE - INITIAL  Pharmacy Consult for Zosyn  (and continue Vancomycin) Indication: wound infection    Allergies  Allergen Reactions  . Latanoprost Anaphylaxis    redness  . Pentoxifylline     Severe SOB, mental confusion per daughter  . Versed [Midazolam] Other (See Comments)     hallucinations per daughter  . Warfarin Sodium Rash  . Alphagan P [Brimonidine Tartrate] Photosensitivity    Redness bilateral eye  . Avelox [Moxifloxacin Hcl In Nacl] Other (See Comments)    tongue swelling  . Clonidine Derivatives Other (See Comments)    Pt's daughter states she has severe hallucinations  . Dilaudid [Hydromorphone Hcl] Nausea And Vomiting    IV formulation only  . Diltiazem Hcl Hives  . Doxycycline Nausea And Vomiting  . Hydralazine Itching  . Meloxicam Other (See Comments)    Odd feeling  . Moxifloxacin Other (See Comments)     Tongue swelling; mouth ulcers; confusion  . Contrast Media [Iodinated Diagnostic Agents] Rash    Contrast dye.  . Iron Rash     Vital Signs: Temp: 98.1 F (36.7 C) (06/16 1400) Temp src: Oral (06/16 1400) BP: 149/54 mmHg (06/16 1400) Pulse Rate: 69 (06/16 1400) Intake/Output from previous day:   Intake/Output from this shift:    Labs: No results found for this basename: WBC, HGB, PLT, LABCREA, CREATININE,  in the last 72 hours The CrCl is unknown because both a height and weight (above a minimum accepted value) are required for this calculation. No results found for this basename: VANCOTROUGH, VANCOPEAK, VANCORANDOM, GENTTROUGH, GENTPEAK, GENTRANDOM, TOBRATROUGH, TOBRAPEAK, TOBRARND, AMIKACINPEAK, AMIKACINTROU, AMIKACIN,  in the last 72 hours   Microbiology: No results found for this or any previous visit (from the past 720 hour(s)).  Medical History: Past Medical History  Diagnosis Date  . Peripheral vascular disease   . Carotid artery occlusion   . Hypertension   . Hyperlipidemia   . Arthritis   . Pacemaker 2008   [facility] in [place], [place]  . Complication of anesthesia   . Dysrhythmia     atrial fibrilation  . Anxiety   . Chronic venous insufficiency     B  . Atrioventricular block, complete s/p AV junction ablation 11/19/2012  . Neuromuscular disorder     periphreal neuroptahty  . 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 acetaminophen (TYLENOL) 500 MG chewable tablet Chew 1,000 mg by mouth every 6 (six) hours as needed for pain.      Marland Kitchen ALPRAZolam (XANAX) 0.25 MG tablet Take 0.25 mg by mouth at bedtime.      Marland Kitchen amLODipine (NORVASC) 10 MG tablet Take 1 tablet (10 mg total) by mouth daily.  30 tablet  11  . aspirin EC 81 MG tablet Take 81 mg by mouth daily.      . bisacodyl (DULCOLAX) 10 MG suppository Place 10 mg rectally as needed for moderate constipation.      . Cholecalciferol (VITAMIN D) 2000 UNITS tablet Take 4,000 Units by mouth daily at 12 noon.       . clopidogrel (PLAVIX) 75 MG tablet Take 75 mg by mouth daily.      . cyclobenzaprine (FLEXERIL) 5 MG tablet Take 5 mg by mouth 2 (two) times daily as needed for muscle spasms.      Marland Kitchen docusate sodium (COLACE) 100 MG capsule Take 200 mg by mouth daily.       Marland Kitchen  dorzolamide (TRUSOPT) 2 % ophthalmic solution Place 1 drop into the left eye 2 (two) times daily.  10 mL  11  . erythromycin ophthalmic ointment 1 application 3 (three) times daily. Apply to left lid for 14 days      . fentaNYL (DURAGESIC) 25 MCG/HR patch Place 1 patch (25 mcg total) onto the skin every 3 (three) days.  5 patch  0  . ferrous sulfate 325 (65 FE) MG tablet Take 325 mg by mouth daily with breakfast.      . gabapentin (NEURONTIN) 100 MG capsule Take 100 mg by mouth 3 (three) times daily.      Marland Kitchen lisinopril (PRINIVIL,ZESTRIL) 20 MG tablet Take 1 tablet (20 mg total) by mouth 2 (two) times daily.  60 tablet  0  . loteprednol (LOTEMAX) 0.5 % ophthalmic suspension Place 1 drop into both  eyes daily.      . magnesium hydroxide (MILK OF MAGNESIA) 400 MG/5 ML SUSP Take 30 mLs by mouth daily as needed (constipation).       . metoprolol succinate (TOPROL XL) 25 MG 24 hr tablet Take 0.5 tablets (12.5 mg total) by mouth daily.  30 tablet  0  . Multiple Vitamin (MULTIVITAMIN WITH MINERALS) TABS tablet Take 1 tablet by mouth daily at 12 noon.       Marland Kitchen oxyCODONE-acetaminophen (PERCOCET/ROXICET) 5-325 MG per tablet Take by mouth every 6 (six) hours as needed for moderate pain or severe pain.      . polyethylene glycol (MIRALAX / GLYCOLAX) packet Take 17 g by mouth daily as needed for mild constipation.      . potassium chloride SA (K-DUR,KLOR-CON) 20 MEQ tablet Take 1 tablet (20 mEq total) by mouth daily.  30 tablet  0  . psyllium (METAMUCIL) 58.6 % powder Take 1 packet by mouth as needed (constipation).       . pyridOXINE (B-6) 100 MG tablet Take 1 tablet (100 mg total) by mouth daily.  30 tablet  0  . sulfamethoxazole-trimethoprim (BACTRIM DS,SEPTRA DS) 800-160 MG per tablet Take 1 tablet by mouth 2 (two) times daily. For 7 days      . vitamin C (ASCORBIC ACID) 500 MG tablet Take 500 mg by mouth daily at 12 noon.        Scheduled:  . amLODipine  10 mg Oral Daily  . aspirin EC  81 mg Oral Daily  . clopidogrel  75 mg Oral Daily  . docusate sodium  200 mg Oral Daily  . dorzolamide  1 drop Left Eye BID  . fentaNYL  25 mcg Transdermal Q72H  . [START ON 08/03/2013] ferrous sulfate  325 mg Oral Q breakfast  . gabapentin  100 mg Oral TID  . lisinopril  20 mg Oral BID  . loteprednol  1 drop Both Eyes Daily  . metoprolol succinate  12.5 mg Oral Daily  . multivitamin with minerals  1 tablet Oral Q1200  . pantoprazole  40 mg Oral Daily  . polyethylene glycol  17 g Oral Daily  . potassium chloride SA  20 mEq Oral Daily  . potassium chloride  20-40 mEq Oral Once  . psyllium  1 packet Oral Daily  . pyridOXINE  100 mg Oral Daily  . [START ON 08/03/2013] vancomycin  500 mg Intravenous Q24H  .  vancomycin  750 mg Intravenous Once  . vitamin C  500 mg Oral Q1200  . Vitamin D  4,000 Units Oral Q1200     Assessment: Pharmacy consulted to dose  IV Zosyn for this 80 yo female with R BKA 05/25/13 and with progression of erythema and separation of the wound to begin vancomcyin for wound infection. Patient noted for debridement and possible revision. SCr= 0.64 on 05/27/13 with estimated CrCl ~35-40 ml/min.  Goal of Therapy:  Appropriate antibiotic dosing for patient's renal function.  Vancomycin trough= 10-15  Plan:  Zosyn 3.375 gm IV q8h (infuse each dose over 4 hrs) Continue vancomycin as previously ordered,  500mg  IV q24h. (after 750mg  IV x1 initial dose) -Will follow renal function and clinical progress -CMET pending today.    Nicole Cella, RPh Clinical Pharmacist Pager: [phone]  08/02/2013 3:01 PM

## 2013-08-02 NOTE — Progress Notes (Addendum)
ANTIBIOTIC CONSULT NOTE - INITIAL  Pharmacy Consult for vancomycin Indication: wound infection  Allergies  Allergen Reactions  . Latanoprost Anaphylaxis    redness  . Pentoxifylline     Severe SOB, mental confusion per daughter  . Versed [Midazolam] Other (See Comments)     hallucinations per daughter  . Warfarin Sodium Rash  . Alphagan P [Brimonidine Tartrate] Photosensitivity    Redness bilateral eye  . Oxycodone Hcl Itching  . Avelox [Moxifloxacin Hcl In Nacl] Other (See Comments)    tongue swelling  . Clonidine Derivatives Other (See Comments)    Pt's daughter states she has severe hallucinations  . Dilaudid [Hydromorphone Hcl] Nausea And Vomiting    IV formulation only  . Diltiazem Hcl Hives  . Doxycycline Nausea And Vomiting  . Hydralazine Itching  . Meloxicam Other (See Comments)    Odd feeling  . Moxifloxacin Other (See Comments)     Tongue swelling; mouth ulcers; confusion  . Contrast Media [Iodinated Diagnostic Agents] Rash    Contrast dye.  . Iron Rash     Vital Signs: Temp: 97.9 F (36.6 C) (06/16 1157) Temp src: Oral (06/16 1157) BP: 135/40 mmHg (06/16 1157) Pulse Rate: 70 (06/16 1157) Intake/Output from previous day:   Intake/Output from this shift:    Labs: No results found for this basename: WBC, HGB, PLT, LABCREA, CREATININE,  in the last 72 hours The CrCl is unknown because both a height and weight (above a minimum accepted value) are required for this calculation. No results found for this basename: VANCOTROUGH, VANCOPEAK, VANCORANDOM, GENTTROUGH, GENTPEAK, GENTRANDOM, TOBRATROUGH, TOBRAPEAK, TOBRARND, AMIKACINPEAK, AMIKACINTROU, AMIKACIN,  in the last 72 hours   Microbiology: No results found for this or any previous visit (from the past 720 hour(s)).  Medical History: Past Medical History  Diagnosis Date  . Peripheral vascular disease   . Carotid artery occlusion   . Hypertension   . Hyperlipidemia   . Arthritis   . Pacemaker 2008    [facility] in [place], [place]  . Complication of anesthesia   . Dysrhythmia     atrial fibrilation  . Anxiety   . Chronic venous insufficiency     B  . Atrioventricular block, complete s/p AV junction ablation 11/19/2012  . Neuromuscular disorder     periphreal neuroptahty  . 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 acetaminophen (TYLENOL) 500 MG chewable tablet Chew 1,000 mg by mouth every 6 (six) hours as needed for pain.      Marland Kitchen ALPRAZolam (XANAX) 0.25 MG tablet Take 1 tablet (0.25 mg total) by mouth at bedtime as needed for sleep.  20 tablet  0  . amLODipine (NORVASC) 10 MG tablet Take 1 tablet (10 mg total) by mouth daily.  30 tablet  11  . aspirin EC 81 MG tablet Take 81 mg by mouth daily.      . bisacodyl (DULCOLAX) 10 MG suppository Place 10 mg rectally as needed for moderate constipation.      . Cholecalciferol (VITAMIN D) 2000 UNITS tablet Take 4,000 Units by mouth daily at 12 noon.       . clopidogrel (PLAVIX) 75 MG tablet Take 75 mg by mouth daily.      . cyclobenzaprine (FLEXERIL) 5 MG tablet Take 5 mg by mouth 2 (two) times daily as needed for muscle spasms.      Marland Kitchen docusate sodium (COLACE) 100 MG capsule Take 200  mg by mouth daily.       . dorzolamide (TRUSOPT) 2 % ophthalmic solution Place 1 drop into the left eye 2 (two) times daily.  10 mL  11  . fentaNYL (DURAGESIC) 25 MCG/HR patch Place 1 patch (25 mcg total) onto the skin every 3 (three) days.  5 patch  0  . ferrous sulfate 325 (65 FE) MG tablet Take 325 mg by mouth daily with breakfast.      . gabapentin (NEURONTIN) 100 MG capsule Take 100 mg by mouth 3 (three) times daily.      Marland Kitchen lisinopril (PRINIVIL,ZESTRIL) 20 MG tablet Take 1 tablet (20 mg total) by mouth 2 (two) times daily.  60 tablet  0  . loteprednol (LOTEMAX) 0.5 % ophthalmic suspension Place 1 drop into both eyes daily.      . magnesium hydroxide (MILK OF MAGNESIA)  400 MG/5 ML SUSP Take 30 mLs by mouth daily as needed.      . metoprolol succinate (TOPROL XL) 25 MG 24 hr tablet Take 0.5 tablets (12.5 mg total) by mouth daily.  30 tablet  0  . Multiple Vitamin (MULTIVITAMIN WITH MINERALS) TABS tablet Take 1 tablet by mouth daily at 12 noon.       Marland Kitchen oxyCODONE-acetaminophen (PERCOCET/ROXICET) 5-325 MG per tablet Take 1 tablet by mouth every 6 (six) hours as needed.  30 tablet  0  . polyethylene glycol (MIRALAX / GLYCOLAX) packet Take 17 g by mouth daily.  14 each  0  . potassium chloride SA (K-DUR,KLOR-CON) 20 MEQ tablet Take 1 tablet (20 mEq total) by mouth daily.  30 tablet  0  . psyllium (METAMUCIL) 58.6 % powder Take 1 packet by mouth as needed.      . pyridOXINE (B-6) 100 MG tablet Take 1 tablet (100 mg total) by mouth daily.  30 tablet  0  . Sulfamethoxazole-Trimethoprim (BACTRIM DS PO) Take 1 tablet by mouth 2 (two) times daily.      . vitamin C (ASCORBIC ACID) 500 MG tablet Take 500 mg by mouth daily at 12 noon.        Scheduled:  . amLODipine  10 mg Oral Daily  . aspirin EC  81 mg Oral Daily  . clopidogrel  75 mg Oral Daily  . docusate sodium  200 mg Oral Daily  . dorzolamide  1 drop Left Eye BID  . fentaNYL  25 mcg Transdermal Q72H  . [START ON 08/03/2013] ferrous sulfate  325 mg Oral Q breakfast  . gabapentin  100 mg Oral TID  . lisinopril  20 mg Oral BID  . loteprednol  1 drop Both Eyes Daily  . metoprolol succinate  12.5 mg Oral Daily  . multivitamin with minerals  1 tablet Oral Q1200  . pantoprazole  40 mg Oral Daily  . polyethylene glycol  17 g Oral Daily  . potassium chloride SA  20 mEq Oral Daily  . potassium chloride  20-40 mEq Oral Once  . psyllium  1 packet Oral Daily  . pyridOXINE  100 mg Oral Daily  . vitamin C  500 mg Oral Q1200  . Vitamin D  4,000 Units Oral Q1200     Assessment: 80 yo female with R BKA 05/25/13 and with progression of erythema and separation of the wound to begin vancomcyin for wound infection. Patient  noted for debridement and possible revision. SCr= 0.64 on 05/27/13 with estimated CrCl ~ 35-40.  Goal of Therapy:  Vancomycin trough= 10-15  Plan:  -Vancomycin 750mg   IV now followed by 500mg  IV q24h -Will follow renal function and clinical progress -BMET in am to check SCr  Hildred Laser, Pharm D 08/02/2013 2:02 PM

## 2013-08-03 ENCOUNTER — Inpatient Hospital Stay (HOSPITAL_COMMUNITY): Payer: Medicare Other | Admitting: Anesthesiology

## 2013-08-03 ENCOUNTER — Encounter (HOSPITAL_COMMUNITY): Payer: Medicare Other | Admitting: Anesthesiology

## 2013-08-03 ENCOUNTER — Encounter (HOSPITAL_COMMUNITY)
Admission: AD | Disposition: A | Discharge status: Skilled Nursing Facility | Payer: Self-pay | Source: Ambulatory Visit | Attending: Vascular Surgery

## 2013-08-03 ENCOUNTER — Inpatient Hospital Stay: Admit: 2013-08-03 | Payer: Self-pay | Admitting: Vascular Surgery

## 2013-08-03 DIAGNOSIS — T8140XA Infection following a procedure, unspecified, initial encounter: Secondary | ICD-10-CM

## 2013-08-03 HISTORY — PX: I&D EXTREMITY: SHX5045

## 2013-08-03 HISTORY — PX: APPLICATION OF WOUND VAC: SHX5189

## 2013-08-03 LAB — BASIC METABOLIC PANEL
BUN: 31 mg/dL — ABNORMAL HIGH (ref 6–23)
CHLORIDE: 103 meq/L (ref 96–112)
CO2: 22 meq/L (ref 19–32)
Calcium: 9.6 mg/dL (ref 8.4–10.5)
Creatinine, Ser: 1.18 mg/dL — ABNORMAL HIGH (ref 0.50–1.10)
GFR calc Af Amer: 46 mL/min — ABNORMAL LOW (ref >90)
GFR calc non Af Amer: 40 mL/min — ABNORMAL LOW (ref >90)
Glucose, Bld: 85 mg/dL (ref 70–99)
Potassium: 4.5 mEq/L (ref 3.7–5.3)
Sodium: 138 mEq/L (ref 137–147)

## 2013-08-03 LAB — URINALYSIS, ROUTINE W REFLEX MICROSCOPIC
BILIRUBIN URINE: NEGATIVE
Glucose, UA: NEGATIVE mg/dL
Hgb urine dipstick: NEGATIVE
Ketones, ur: NEGATIVE mg/dL
Leukocytes, UA: NEGATIVE
Nitrite: NEGATIVE
PH: 5.5 (ref 5.0–8.0)
Protein, ur: NEGATIVE mg/dL
Specific Gravity, Urine: 1.015 (ref 1.005–1.030)
Urobilinogen, UA: 1 mg/dL (ref 0.0–1.0)

## 2013-08-03 SURGERY — IRRIGATION AND DEBRIDEMENT EXTREMITY
Anesthesia: General | Site: Leg Lower | Laterality: Right

## 2013-08-03 MED ORDER — LIDOCAINE HCL (CARDIAC) 20 MG/ML IV SOLN
INTRAVENOUS | Status: DC | PRN
  Administered 2013-08-03: 40 mg via INTRAVENOUS

## 2013-08-03 MED ORDER — ACETAMINOPHEN 325 MG PO TABS: 325.0000 mg | ORAL_TABLET | ORAL | Status: DC | PRN

## 2013-08-03 MED ORDER — FENTANYL CITRATE 0.05 MG/ML IJ SOLN
INTRAMUSCULAR | Status: AC
  Filled 2013-08-03: qty 5

## 2013-08-03 MED ORDER — PROPOFOL 10 MG/ML IV BOLUS
INTRAVENOUS | Status: AC
  Filled 2013-08-03: qty 20

## 2013-08-03 MED ORDER — ERYTHROMYCIN 5 MG/GM OP OINT
1.0000 "application " | TOPICAL_OINTMENT | Freq: Three times a day (TID) | OPHTHALMIC | Status: DC
  Administered 2013-08-03 – 2013-08-05 (×7): 1 via OPHTHALMIC
  Filled 2013-08-03 (×2): qty 3.5

## 2013-08-03 MED ORDER — ALPRAZOLAM 0.25 MG PO TABS
0.2500 mg | ORAL_TABLET | Freq: Every day | ORAL | Status: DC
  Administered 2013-08-03 – 2013-08-05 (×3): 0.25 mg via ORAL
  Filled 2013-08-03 (×3): qty 1

## 2013-08-03 MED ORDER — OXYCODONE HCL 5 MG PO TABS
5.0000 mg | ORAL_TABLET | Freq: Once | ORAL | Status: AC | PRN
  Administered 2013-08-03: 5 mg via ORAL

## 2013-08-03 MED ORDER — FENTANYL CITRATE 0.05 MG/ML IJ SOLN
INTRAMUSCULAR | Status: DC | PRN
  Administered 2013-08-03 (×2): 25 ug via INTRAVENOUS

## 2013-08-03 MED ORDER — GLYCOPYRROLATE 0.2 MG/ML IJ SOLN
INTRAMUSCULAR | Status: AC
  Filled 2013-08-03: qty 2

## 2013-08-03 MED ORDER — 0.9 % SODIUM CHLORIDE (POUR BTL) OPTIME
TOPICAL | Status: DC | PRN
  Administered 2013-08-03: 1000 mL

## 2013-08-03 MED ORDER — ACETAMINOPHEN 160 MG/5ML PO SOLN
325.0000 mg | ORAL | Status: DC | PRN
  Filled 2013-08-03: qty 20.3

## 2013-08-03 MED ORDER — ONDANSETRON HCL 4 MG/2ML IJ SOLN: 4.0000 mg | Freq: Once | INTRAMUSCULAR | Status: DC | PRN

## 2013-08-03 MED ORDER — POLYETHYLENE GLYCOL 3350 17 G PO PACK
17.0000 g | PACK | Freq: Every day | ORAL | Status: DC | PRN
  Filled 2013-08-03: qty 1

## 2013-08-03 MED ORDER — FENTANYL 25 MCG/HR TD PT72
25.0000 ug | MEDICATED_PATCH | TRANSDERMAL | Status: DC
  Administered 2013-08-03: 25 ug via TRANSDERMAL
  Filled 2013-08-03: qty 1

## 2013-08-03 MED ORDER — OXYCODONE-ACETAMINOPHEN 5-325 MG PO TABS
1.0000 | ORAL_TABLET | Freq: Four times a day (QID) | ORAL | Status: DC | PRN
  Filled 2013-08-03: qty 1

## 2013-08-03 MED ORDER — PROPOFOL 10 MG/ML IV BOLUS
INTRAVENOUS | Status: DC | PRN
  Administered 2013-08-03: 100 mg via INTRAVENOUS

## 2013-08-03 MED ORDER — SODIUM CHLORIDE 0.9 % IV SOLN: INTRAVENOUS | Status: DC

## 2013-08-03 MED ORDER — NEOSTIGMINE METHYLSULFATE 10 MG/10ML IV SOLN
INTRAVENOUS | Status: AC
  Filled 2013-08-03: qty 1

## 2013-08-03 MED ORDER — CLOPIDOGREL BISULFATE 75 MG PO TABS
75.0000 mg | ORAL_TABLET | Freq: Every day | ORAL | Status: DC
  Administered 2013-08-04 – 2013-08-06 (×3): 75 mg via ORAL
  Filled 2013-08-03 (×4): qty 1

## 2013-08-03 MED ORDER — OXYCODONE HCL 5 MG PO TABS
ORAL_TABLET | ORAL | Status: AC
  Filled 2013-08-03: qty 1

## 2013-08-03 MED ORDER — FENTANYL CITRATE 0.05 MG/ML IJ SOLN
INTRAMUSCULAR | Status: AC
  Administered 2013-08-03: 25 ug via INTRAVENOUS
  Filled 2013-08-03: qty 2

## 2013-08-03 MED ORDER — FENTANYL CITRATE 0.05 MG/ML IJ SOLN
25.0000 ug | INTRAMUSCULAR | Status: DC | PRN
  Administered 2013-08-03 (×2): 25 ug via INTRAVENOUS

## 2013-08-03 MED ORDER — OXYCODONE HCL 5 MG/5ML PO SOLN: 5.0000 mg | Freq: Once | ORAL | Status: AC | PRN

## 2013-08-03 SURGICAL SUPPLY — 45 items
BANDAGE ELASTIC 4 VELCRO ST LF (GAUZE/BANDAGES/DRESSINGS) ×3 IMPLANT
BANDAGE ELASTIC 6 VELCRO ST LF (GAUZE/BANDAGES/DRESSINGS) IMPLANT
BANDAGE ESMARK 6X9 LF (GAUZE/BANDAGES/DRESSINGS) IMPLANT
BANDAGE GAUZE ELAST BULKY 4 IN (GAUZE/BANDAGES/DRESSINGS) IMPLANT
BLADE 10 SAFETY STRL DISP (BLADE) ×3 IMPLANT
BNDG CMPR 9X6 STRL LF SNTH (GAUZE/BANDAGES/DRESSINGS)
BNDG ESMARK 6X9 LF (GAUZE/BANDAGES/DRESSINGS)
CANISTER SUCTION 2500CC (MISCELLANEOUS) ×3 IMPLANT
CANISTER WOUND CARE 500ML ATS (WOUND CARE) ×3 IMPLANT
CLIP LIGATING EXTRA MED SLVR (CLIP) ×3 IMPLANT
CLIP LIGATING EXTRA SM BLUE (MISCELLANEOUS) ×3 IMPLANT
COVER SURGICAL LIGHT HANDLE (MISCELLANEOUS) ×3 IMPLANT
CUFF TOURNIQUET SINGLE 34IN LL (TOURNIQUET CUFF) IMPLANT
CUFF TOURNIQUET SINGLE 44IN (TOURNIQUET CUFF) IMPLANT
DRAIN SNY 10X20 3/4 PERF (WOUND CARE) IMPLANT
DRAPE ORTHO SPLIT 77X108 STRL (DRAPES) ×6
DRAPE PROXIMA HALF (DRAPES) ×3 IMPLANT
DRAPE SURG ORHT 6 SPLT 77X108 (DRAPES) ×2 IMPLANT
DRSG VAC ATS MED SENSATRAC (GAUZE/BANDAGES/DRESSINGS) ×3 IMPLANT
ELECT REM PT RETURN 9FT ADLT (ELECTROSURGICAL) ×3
ELECTRODE REM PT RTRN 9FT ADLT (ELECTROSURGICAL) ×1 IMPLANT
EVACUATOR SILICONE 100CC (DRAIN) IMPLANT
GAUZE XEROFORM 5X9 LF (GAUZE/BANDAGES/DRESSINGS) ×3 IMPLANT
GLOVE SS BIOGEL STRL SZ 7.5 (GLOVE) ×1 IMPLANT
GLOVE SUPERSENSE BIOGEL SZ 7.5 (GLOVE) ×2
GOWN STRL REUS W/ TWL LRG LVL3 (GOWN DISPOSABLE) ×3 IMPLANT
GOWN STRL REUS W/TWL LRG LVL3 (GOWN DISPOSABLE) ×9
KIT BASIN OR (CUSTOM PROCEDURE TRAY) ×3 IMPLANT
KIT ROOM TURNOVER OR (KITS) ×3 IMPLANT
NS IRRIG 1000ML POUR BTL (IV SOLUTION) ×3 IMPLANT
PACK GENERAL/GYN (CUSTOM PROCEDURE TRAY) ×3 IMPLANT
PAD ARMBOARD 7.5X6 YLW CONV (MISCELLANEOUS) ×6 IMPLANT
PADDING CAST COTTON 6X4 STRL (CAST SUPPLIES) IMPLANT
SAW GIGLI STERILE 20 (MISCELLANEOUS) ×3 IMPLANT
SPONGE GAUZE 4X4 12PLY (GAUZE/BANDAGES/DRESSINGS) ×3 IMPLANT
STAPLER VISISTAT 35W (STAPLE) ×3 IMPLANT
STOCKINETTE IMPERVIOUS LG (DRAPES) ×3 IMPLANT
SUT ETHILON 3 0 PS 1 (SUTURE) IMPLANT
SUT VIC AB 0 CT1 18XCR BRD 8 (SUTURE) ×2 IMPLANT
SUT VIC AB 0 CT1 8-18 (SUTURE) ×6
SUT VICRYL AB 2 0 TIES (SUTURE) ×3 IMPLANT
TOWEL OR 17X24 6PK STRL BLUE (TOWEL DISPOSABLE) ×3 IMPLANT
TOWEL OR 17X26 10 PK STRL BLUE (TOWEL DISPOSABLE) ×3 IMPLANT
UNDERPAD 30X30 INCONTINENT (UNDERPADS AND DIAPERS) ×3 IMPLANT
WATER STERILE IRR 1000ML POUR (IV SOLUTION) ×3 IMPLANT

## 2013-08-03 NOTE — Anesthesia Procedure Notes (Signed)
Procedure Name: LMA Insertion Date/Time: 08/03/2013 12:50 PM Performed by: Eligha Bridegroom Pre-anesthesia Checklist: Patient identified, Emergency Drugs available, Suction available, Patient being monitored and Timeout performed Patient Re-evaluated:Patient Re-evaluated prior to inductionOxygen Delivery Method: Circle system utilized Preoxygenation: Pre-oxygenation with 100% oxygen Intubation Type: IV induction LMA: LMA inserted LMA Size: 4.0 Number of attempts: 1 Placement Confirmation: positive ETCO2 and breath sounds checked- equal and bilateral Tube secured with: Tape Dental Injury: Teeth and Oropharynx as per pre-operative assessment

## 2013-08-03 NOTE — Significant Event (Addendum)
Old fentanyl patch 102mcg (dated 07-31-13) removed from left chest. New patched applied on right arm/deltoid area

## 2013-08-03 NOTE — Op Note (Signed)
    OPERATIVE REPORT  DATE OF SURGERY: 08/03/2013  PATIENT: Regina Hamilton, 80 y.o. female MRN: [id number]  DOB: October 20, 1923  PRE-OPERATIVE DIAGNOSIS: Poorly healing right below-knee amputation  POST-OPERATIVE DIAGNOSIS:  Same  PROCEDURE: Debridement of below knee amputation and VAC dressing placement  SURGEON:  Curt Jews, M.D.  PHYSICIAN ASSISTANT: Dellie Catholic PA-C  ANESTHESIA:  Gen.  EBL: Minimal ml  Total I/O In: 250 [I.V.:250] Out: 350 [Urine:350]  BLOOD ADMINISTERED: None  DRAINS: VAC  SPECIMEN: None  COUNTS CORRECT:  YES  PLAN OF CARE: PACU   PATIENT DISPOSITION:  PACU - hemodynamically stable  PROCEDURE DETAILS: Patient status post right below-knee amputation April 2015. She initially had very good healing of this. She presented the options with progressive Lee superficial and potential deep tissue loss. She taken up from the stump or debridement and possible possible revision.  The wound irrigated and and prepped and draped in usual sterile fashion. There was undermining from the more medial portion of the incision this was opened. All necrotic debris was debrided. This did not extend down to the bone. The tissue loss and necrotic was relatively superficial with minimal involvement of the fascia. There was very good bleeding of the wound. The wound was irrigated with saline and hemostasis tablet cautery. The wound had a wound VAC placed in the operating room and the patient was transferred to the recovery room in stable condition   Curt Jews, M.D. 08/03/2013 1:46 PM

## 2013-08-03 NOTE — H&P (View-Only) (Signed)
Patient name: Regina Hamilton MRN: [id number] DOB: 03-Mar-1923 Sex: female     Reason for referral:  Chief Complaint  Patient presents with  . Re-evaluation    2 wk f/u     HISTORY OF PRESENT ILLNESS: The patient presents today for continued followup of her right below-knee amputation. She had multiple revascularization attempts in the past with nonhealing ulceration in her right foot. She undergo an right below-knee amputation by myself on 05/25/2013. She did well in the hospital was discharged to a nursing facility. She will call our office with good early healing. I had seen her in one month ago with near total healing with removal of her staples and one very small area on the lateral aspect of the incision line could be quite superficial. She presented to our office on 07/19/2013 with some worsening separation. She was seen wound center as well. This is been progressive and she is seen today for further evaluation. She does have a progression of erythema and separation of the wound and tracking under the incision was some necrotic appearing fascia and the base of the wound. She has not had fevers  Past Medical History  Diagnosis Date  . Peripheral vascular disease   . Carotid artery occlusion   . Hypertension   . Hyperlipidemia   . Arthritis   . Pacemaker 2008    [facility] in [place], [place]  . Complication of anesthesia   . Dysrhythmia     atrial fibrilation  . Anxiety   . Chronic venous insufficiency     B  . Atrioventricular block, complete s/p AV junction ablation 11/19/2012  . Neuromuscular disorder     periphreal neuroptahty  . Ischemia of right BKA site 05/26/2013    Past Surgical History  Procedure Laterality Date  . Spine surgery  1970    Cervical fusion  . Cardio ablation- pacemaker    . Pr vein bypass graft,aorto-fem-pop    . Total hip revision      bilateral  . Cholecystectomy  05/20/11  . Angioplasty  03/01/2012  . Insert / replace / remove  pacemaker    . Corneal transplant Bilateral   . Back surgery    . Abdominal aortagram  Feb. 16, 2015  . Amputation Right 05/25/2013    Procedure: AMPUTATION BELOW KNEE;  Surgeon: Rosetta Posner, MD;  Location: [place];  Service: Vascular;  Laterality: Right;    History   Social History  . Marital Status: Widowed    Spouse Name: N/A    Number of Children: N/A  . Years of Education: N/A   Occupational History  . Not on file.   Social History Main Topics  . Smoking status: Never Smoker   . Smokeless tobacco: Never Used  . Alcohol Use: No  . Drug Use: No  . Sexual Activity: No   Other Topics Concern  . Not on file   Social History Narrative  . No narrative on file    Family History  Problem Relation Age of Onset  . Heart disease Mother     Heart disease before age 51  . Hypertension Mother   . Varicose Veins Mother   . Peripheral vascular disease Mother   . Other Mother     amputation  . Heart disease Father     Heat Diease before age 67  . Heart disease Maternal Grandmother   . Heart disease Paternal Grandmother   . Diabetes Sister   .  Hypertension Sister   . Varicose Veins Sister   . Diabetes Brother   . Hypertension Brother   . Heart attack Brother   . Hypertension Daughter     Allergies as of 08/02/2013 - Review Complete 08/02/2013  Allergen Reaction Noted  . Latanoprost Anaphylaxis 03/31/2013  . Pentoxifylline  03/30/2013  . Versed [midazolam] Other (See Comments) 10/11/2009  . Warfarin sodium Rash 10/16/2009  . Alphagan p [brimonidine tartrate] Photosensitivity 09/10/2011  . Oxycodone hcl Itching 10/22/2011  . Avelox [moxifloxacin hcl in nacl] Other (See Comments) 03/30/2013  . Clonidine derivatives Other (See Comments) 10/22/2011  . Dilaudid [hydromorphone hcl] Nausea And Vomiting 05/24/2013  . Diltiazem hcl Hives 03/21/2011  . Doxycycline Nausea And Vomiting 03/31/2013  . Hydralazine Itching 09/13/2012  . Meloxicam Other (See Comments) 03/21/2011  .  Moxifloxacin Other (See Comments)   . Contrast media [iodinated diagnostic agents] Rash 10/31/2011  . Iron Rash     Current Outpatient Prescriptions on File Prior to Visit  Medication Sig Dispense Refill  . acetaminophen (TYLENOL) 500 MG chewable tablet Chew 1,000 mg by mouth every 6 (six) hours as needed for pain.      Marland Kitchen ALPRAZolam (XANAX) 0.25 MG tablet Take 1 tablet (0.25 mg total) by mouth at bedtime as needed for sleep.  20 tablet  0  . amLODipine (NORVASC) 10 MG tablet Take 1 tablet (10 mg total) by mouth daily.  30 tablet  11  . aspirin EC 81 MG tablet Take 81 mg by mouth daily.      . bisacodyl (DULCOLAX) 10 MG suppository Place 10 mg rectally as needed for moderate constipation.      . Cholecalciferol (VITAMIN D) 2000 UNITS tablet Take 4,000 Units by mouth daily at 12 noon.       . clopidogrel (PLAVIX) 75 MG tablet Take 75 mg by mouth daily.      . cyclobenzaprine (FLEXERIL) 5 MG tablet Take 5 mg by mouth 2 (two) times daily as needed for muscle spasms.      Marland Kitchen docusate sodium (COLACE) 100 MG capsule Take 200 mg by mouth daily.       . dorzolamide (TRUSOPT) 2 % ophthalmic solution Place 1 drop into the left eye 2 (two) times daily.  10 mL  11  . fentaNYL (DURAGESIC) 25 MCG/HR patch Place 1 patch (25 mcg total) onto the skin every 3 (three) days.  5 patch  0  . ferrous sulfate 325 (65 FE) MG tablet Take 325 mg by mouth daily with breakfast.      . gabapentin (NEURONTIN) 100 MG capsule Take 100 mg by mouth 3 (three) times daily.      Marland Kitchen lisinopril (PRINIVIL,ZESTRIL) 20 MG tablet Take 1 tablet (20 mg total) by mouth 2 (two) times daily.  60 tablet  0  . loteprednol (LOTEMAX) 0.5 % ophthalmic suspension Place 1 drop into both eyes daily.      . magnesium hydroxide (MILK OF MAGNESIA) 400 MG/5 ML SUSP Take 30 mLs by mouth daily as needed.      . metoprolol succinate (TOPROL XL) 25 MG 24 hr tablet Take 0.5 tablets (12.5 mg total) by mouth daily.  30 tablet  0  . Multiple Vitamin (MULTIVITAMIN  WITH MINERALS) TABS tablet Take 1 tablet by mouth daily at 12 noon.       Marland Kitchen oxyCODONE-acetaminophen (PERCOCET/ROXICET) 5-325 MG per tablet Take 1 tablet by mouth every 6 (six) hours as needed.  30 tablet  0  . polyethylene glycol (MIRALAX /  GLYCOLAX) packet Take 17 g by mouth daily.  14 each  0  . potassium chloride SA (K-DUR,KLOR-CON) 20 MEQ tablet Take 1 tablet (20 mEq total) by mouth daily.  30 tablet  0  . psyllium (METAMUCIL) 58.6 % powder Take 1 packet by mouth as needed.      . pyridOXINE (B-6) 100 MG tablet Take 1 tablet (100 mg total) by mouth daily.  30 tablet  0  . vitamin C (ASCORBIC ACID) 500 MG tablet Take 500 mg by mouth daily at 12 noon.        No current facility-administered medications on file prior to visit.     REVIEW OF SYSTEMS:  Data her past medical history issues   PHYSICAL EXAMINATION:  General: The patient is a well-nourished female, in no acute distress. Vital signs are BP 135/40  Pulse 70  Temp(Src) 97.9 F (36.6 C) (Oral)  Ht 5\' 4"  (1.626 m)  Wt 111 lb (50.349 kg)  BMI 19.04 kg/m2  SpO2 100% Pulmonary: There is a good air exchange Abdomen: Soft and non-tender  Musculoskeletal: There are no major deformities. Does have a right below-knee amputation Neurologic: No focal weakness or paresthesias are detected, Skin: Erythema and tenderness over the distal portion of her BKA stump. 3 cm open area with necrotic debris in the base Psychiatric: The patient has normal affect. Cardiovascular: No palpable right popliteal pulse     Impression and Plan:  Discussed with the Patient and Her 2 Daughters Present. Unusual presentation with near total healing and now all marked deterioration of her ability of dictation. I have recommended that she be admitted to the hospital today for IV antibiotics and surgery tomorrow for debridement of the below-knee amputation and possible revision. I did explain the option of above-knee dictation with the much higher up  possibility of healing. The patient is adamantly opposed to considering above-knee amputation. I do feel she is a reasonable chance for healing this put him at a loss to explain why this looked so good for nearly 2 months and then deteriorated. We will plan for revision tomorrow and stay understand the possibility of ongoing nonhealing    EARLY, TODD [facility] Office: [phone]

## 2013-08-03 NOTE — Anesthesia Preprocedure Evaluation (Addendum)
Anesthesia Evaluation  Patient identified by MRN, date of birth, ID band Patient awake    Reviewed: Allergy & Precautions, H&P , NPO status , Patient's Chart, lab work & pertinent test results, reviewed documented beta blocker date and time   History of Anesthesia Complications Negative for: history of anesthetic complications  Airway Mallampati: II TM Distance: >3 FB Neck ROM: Full    Dental  (+) Teeth Intact   Pulmonary neg sleep apnea, neg COPD breath sounds clear to auscultation        Cardiovascular hypertension, Pt. on medications and Pt. on home beta blockers - angina+ Peripheral Vascular Disease - CHF + dysrhythmias Atrial Fibrillation + pacemaker Rhythm:Regular  Nl EF, h/o afib s/p ablation, st jude pacemaker vvir low rate 70bpm, 99% RV pacing, will place magnet if Bovie interference   Neuro/Psych PSYCHIATRIC DISORDERS Anxiety Depression  Neuromuscular disease    GI/Hepatic negative GI ROS, Neg liver ROS,   Endo/Other  negative endocrine ROS  Renal/GU Renal InsufficiencyRenal disease     Musculoskeletal   Abdominal   Peds  Hematology  (+) Blood dyscrasia, anemia ,   Anesthesia Other Findings   Reproductive/Obstetrics                          Anesthesia Physical Anesthesia Plan  ASA: III  Anesthesia Plan: General   Post-op Pain Management:    Induction: Intravenous  Airway Management Planned: LMA  Additional Equipment: None  Intra-op Plan:   Post-operative Plan: Extubation in OR  Informed Consent: I have reviewed the patients History and Physical, chart, labs and discussed the procedure including the risks, benefits and alternatives for the proposed anesthesia with the patient or authorized representative who has indicated his/her understanding and acceptance.   Dental advisory given  Plan Discussed with: CRNA and Surgeon  Anesthesia Plan Comments:          Anesthesia Quick Evaluation

## 2013-08-03 NOTE — Transfer of Care (Signed)
Immediate Anesthesia Transfer of Care Note  Patient: Regina Hamilton  Procedure(s) Performed: Procedure(s): IRRIGATION AND DEBRIDEMENT EXTREMITY OF RIGHT BELOW THE KNEE AMPUTATION SITE (Right) APPLICATION OF WOUND VAC TO RIGHT LEG STUMP (Right)  Patient Location: PACU  Anesthesia Type:General  Level of Consciousness: awake, alert  and oriented  Airway & Oxygen Therapy: Patient Spontanous Breathing and Patient connected to nasal cannula oxygen  Post-op Assessment: Report given to PACU RN and Post -op Vital signs reviewed and stable  Post vital signs: Reviewed and stable  Complications: No apparent anesthesia complications

## 2013-08-03 NOTE — Interval H&P Note (Signed)
History and Physical Interval Note:  08/03/2013 11:35 AM  Regina Hamilton  has presented today for surgery, with the diagnosis of Nonhealing Right Below Knee Amputation Stump  The various methods of treatment have been discussed with the patient and family. After consideration of risks, benefits and other options for treatment, the patient has consented to  Procedure(s): REVISION OF RIGHT BELOW KNEE AMPUTATION (Right) as a surgical intervention .  The patient's history has been reviewed, patient examined, no change in status, stable for surgery.  I have reviewed the patient's chart and labs.  Questions were answered to the patient's satisfaction.     EARLY, TODD

## 2013-08-04 MED ORDER — ENOXAPARIN SODIUM 30 MG/0.3ML ~~LOC~~ SOLN
30.0000 mg | SUBCUTANEOUS | Status: DC
  Administered 2013-08-04 – 2013-08-05 (×2): 30 mg via SUBCUTANEOUS
  Filled 2013-08-04 (×3): qty 0.3

## 2013-08-04 MED ORDER — VANCOMYCIN HCL 500 MG IV SOLR
500.0000 mg | INTRAVENOUS | Status: DC
  Administered 2013-08-04 – 2013-08-05 (×2): 500 mg via INTRAVENOUS
  Filled 2013-08-04 (×3): qty 500

## 2013-08-04 MED ORDER — ENOXAPARIN SODIUM 40 MG/0.4ML ~~LOC~~ SOLN
40.0000 mg | SUBCUTANEOUS | Status: DC
  Filled 2013-08-04: qty 0.4

## 2013-08-04 NOTE — Progress Notes (Addendum)
  [facility] Progress Note  08/04/2013 9:44 AM 1 Day Post-Op  Subjective:  Pt is having no complaints  Tmax 98 BP sys 130s-170s 02 100% RA  Filed Vitals:   08/04/13 0653  BP: 138/64  Pulse:   Temp:   Resp:     Physical Exam: Incisions:  With wound vac to right stump; serosanguinous drainage Extremities:  Mobilizing stump well   CBC    Component Value Date/Time   WBC 7.9 08/02/2013 1528   RBC 3.64* 08/02/2013 1528   RBC 3.64* 06/20/2009 0730   HGB 10.1* 08/02/2013 1528   HCT 31.0* 08/02/2013 1528   PLT 226 08/02/2013 1528   MCV 85.2 08/02/2013 1528   MCH 27.7 08/02/2013 1528   MCHC 32.6 08/02/2013 1528   RDW 14.4 08/02/2013 1528   LYMPHSABS 2.1 05/18/2013 1126   MONOABS 0.6 05/18/2013 1126   EOSABS 0.1 05/18/2013 1126   BASOSABS 0.0 05/18/2013 1126    BMET    Component Value Date/Time   NA 138 08/03/2013 0612   K 4.5 08/03/2013 0612   CL 103 08/03/2013 0612   CO2 22 08/03/2013 0612   GLUCOSE 85 08/03/2013 0612   BUN 31* 08/03/2013 0612   CREATININE 1.18* 08/03/2013 0612   CALCIUM 9.6 08/03/2013 0612   GFRNONAA 40* 08/03/2013 0612   GFRAA 46* 08/03/2013 0612    INR    Component Value Date/Time   INR 1.02 08/02/2013 1528     Intake/Output Summary (Last 24 hours) at 08/04/13 0944 Last data filed at 08/04/13 0500  Gross per 24 hour  Intake    530 ml  Output    900 ml  Net   -370 ml     Assessment:  80 y.o. female is s/p:  REVISION OF RIGHT BELOW KNEE AMPUTATION   1 Day Post-Op  Plan: -Non-healing surgical wound: Continue IV Vancomycin and Zosyn -Wound vac: consult to wound care, will change dressings MWF -Hypertension: BP stable this am  -Consult to social work -DVT prophylaxis:  Lovenox per pharmacy  -Dispo: patient will be here for 3 midnights before d/c to nursing home   Virgina Jock, [place] [facility] Office: [phone] Pager: [phone] 08/04/2013 9:44 AM     I have examined the patient, reviewed and agree with  above. Spoke with the patient's daughters today. The erythema has resolved and the distal portion of her stump. VAC is intact. Will ask asymmetric to assist with the disposition back to nursing facility tomorrow if possible  EARLY, TODD, MD 08/04/2013 10:19 AM

## 2013-08-04 NOTE — Progress Notes (Signed)
Patient Blood pressure 176/54. Rechecked manually 170/52. Patient asymptomatic. MD notified and ordered to take 1000 blood pressure medications early. Will continue to monitor.   Domingo Dimes RN

## 2013-08-04 NOTE — Progress Notes (Signed)
Clinical Social Work Department BRIEF PSYCHOSOCIAL ASSESSMENT 08/04/2013  Patient:  Regina Hamilton, Regina Hamilton     Account Number:  [id number]     Admit date:  08/02/2013  Clinical Social Worker:  Megan Salon  Date/Time:  08/04/2013 02:27 PM  Referred by:  Physician  Date Referred:  08/04/2013 Referred for  SNF Placement   Other Referral:   Interview type:  Patient Other interview type:    PSYCHOSOCIAL DATA Living Status:  FACILITY Admitted from facility:  Other Level of care:  [name] Primary support name:  [name] Primary support relationship to patient:  CHILD, ADULT Degree of support available:   Good    CURRENT CONCERNS Current Concerns  Post-Acute Placement   Other Concerns:    SOCIAL WORK ASSESSMENT / PLAN CSW received referral that patient is from [name]. CSW went into patient's room and introduced self and explained reason for visit. Patient had visitors by bedside, two daughters Daughters stated that patient is not satisified with the SNF she is currently at and would like to go somewhere else. CSW asked patient if patient was interested in other facilities and patient states she is not sure if she wants to go somewhere else and just wants her wound to heal. Patient then became tearful and states she just wants to go back to the facility at [name] and try it again. Daughters became frustrated and left the room. CSW provided support to patient. Patient then states that she wants to go back to [name] and try it again. CSW explained that if patient goes back and is not happy, a Education officer, museum can help patient transition to a different facility. Patient thanked Education officer, museum for visit.    CSW notified [name] of possible dc tomorrow back to SNF. FL2 on chart for MD signature.   Assessment/plan status:  Psychosocial Support/Ongoing Assessment of Needs Other assessment/ plan:   Information/referral to community  resources:   CSW contact information    PATIENT'S/FAMILY'S RESPONSE TO PLAN OF CARE: Patient states she will go back to [name], [name], [name]

## 2013-08-04 NOTE — Progress Notes (Addendum)
ANTICOAGULATION CONSULT NOTE - Initial Consult  Pharmacy Consult for Enoxaparin Indication: VTE prophylaxis  Allergies  Allergen Reactions  . Latanoprost Anaphylaxis    redness  . Pentoxifylline     Severe SOB, mental confusion per daughter  . Versed [Midazolam] Other (See Comments)     hallucinations per daughter  . Warfarin Sodium Rash  . Alphagan P [Brimonidine Tartrate] Photosensitivity    Redness bilateral eye  . Avelox [Moxifloxacin Hcl In Nacl] Other (See Comments)    tongue swelling  . Clonidine Derivatives Other (See Comments)    Pt's daughter states she has severe hallucinations  . Dilaudid [Hydromorphone Hcl] Nausea And Vomiting    IV formulation only  . Diltiazem Hcl Hives  . Doxycycline Nausea And Vomiting  . Hydralazine Itching  . Meloxicam Other (See Comments)    Odd feeling  . Moxifloxacin Other (See Comments)     Tongue swelling; mouth ulcers; confusion  . Contrast Media [Iodinated Diagnostic Agents] Rash    Contrast dye.  . Iron Rash    Patient Measurements: Weight: 113 lb 5.1 oz (51.4 kg)  Vital Signs: Temp: 98 F (36.7 C) (06/18 0500) Temp src: Oral (06/18 0500) BP: 138/64 mmHg (06/18 0653) Pulse Rate: 71 (06/18 0500)  Labs:  Recent Labs  08/02/13 1528 08/03/13 0612  HGB 10.1*  --   HCT 31.0*  --   PLT 226  --   LABPROT 13.2  --   INR 1.02  --   CREATININE 1.03 1.18*    The CrCl is unknown because both a height and weight (above a minimum accepted value) are required for this calculation.   Medical History: Past Medical History  Diagnosis Date  . Peripheral vascular disease   . Carotid artery occlusion   . Hypertension   . Hyperlipidemia   . Pacemaker 2008    [facility] in [place], [place]  . Dysrhythmia     atrial fibrilation  . Anxiety   . Chronic venous insufficiency     B  . Atrioventricular block, complete s/p AV junction ablation 11/19/2012  . Neuromuscular disorder     periphreal neuroptahty  . Ischemia  of right BKA site 05/26/2013  . Kidney stones     "never had OR"  . Complication of anesthesia     "NO VERSED!!!!!!!!!!!!!!!;  becomes extremely combative; post anesthesia psychosis" (08/02/2013)  . PAD (peripheral artery disease)   . Arthritis     "HANDS" (08/02/2013)  . Spinal stenosis of lumbar region at multiple levels   . Scoliosis   . Chronic back pain     "all over back" (08/02/2013)  . Depression   . Basal cell carcinoma     80 yo female with R BKA 05/25/13 and with progression of erythema and separation of the wound admitted 08/02/2013   to begin vancomcyin & zosyn for wound infection. Pharmacy consulted to dose enoxaparin.  ID: Non healing wound. Patient noted for debridement and possible revision.  Afeb, WBC wnl Vanc: 6/16x 1, resumed 6/18 Zosyn 6/16> No cultures  Coag: VTE Px, Enoxaparin 30 mg SQ q24h  Renal : Est CrCl ~ 25 ml/min (SCr 1.18)     Goal of Therapy:  Anti-Xa level ~0.3 units/ml 4hrs after LMWH dose given Monitor platelets by anticoagulation protocol: Yes   Plan:  Lovenox 30 mg SQ q24h. Follow up periodic CBC Continue Zosyn 3.375g IV q8h infuse over 4h  Follow up SCr, UOP, cultures, clinical course and adjust as clinically indicated.   Thank  you for allowing pharmacy to be a part of this patients care team.  Rowe Robert Pharm.D., BCPS, AQ-Cardiology Clinical Pharmacist 08/04/2013 10:28 AM Pager: [phone] Phone: [phone]  10:37 AM Spoke with Maudie Mercury, and clarified plan is for vanc+zosyn while here.  Clarified vancomycin order entry, to 500 mg Q24h, and schedule dose for now.  [place]

## 2013-08-04 NOTE — Care Management Note (Unsigned)
    Page 1 of 1   08/04/2013     2:28:58 PM CARE MANAGEMENT NOTE 08/04/2013  Patient:  DYNESHA, WOOLEN   Account Number:  [id number]  Date Initiated:  08/04/2013  Documentation initiated by:  Lunabella Badgett  Subjective/Objective Assessment:   Pt adm on 08/02/13 with infected Rt BKA.  PTA, pt resided at [facility].     Action/Plan:   CSW consulted to faciltiate return to SNF when medically stable.  Will follow progress.   Anticipated DC Date:  08/06/2013   Anticipated DC Plan:  SKILLED NURSING FACILITY  In-house referral  Clinical Social Worker      DC Planning Services  CM consult      Choice offered to / List presented to:             Status of service:  In process, will continue to follow Medicare Important Message given?   (If response is "NO", the following Medicare IM given date fields will be blank) Date Medicare IM given:   Date Additional Medicare IM given:    Discharge Disposition:    Per UR Regulation:  Reviewed for med. necessity/level of care/duration of stay  If discussed at [place] of Stay Meetings, dates discussed:    Comments:

## 2013-08-04 NOTE — Anesthesia Postprocedure Evaluation (Signed)
  Anesthesia Post-op Note  Patient: Regina Hamilton  Procedure(s) Performed: Procedure(s): IRRIGATION AND DEBRIDEMENT EXTREMITY OF RIGHT BELOW THE KNEE AMPUTATION SITE (Right) APPLICATION OF WOUND VAC TO RIGHT LEG STUMP (Right)  Patient Location: PACU  Anesthesia Type:General  Level of Consciousness: awake and alert   Airway and Oxygen Therapy: Patient Spontanous Breathing  Post-op Pain: mild  Post-op Assessment: Post-op Vital signs reviewed, Patient's Cardiovascular Status Stable, Respiratory Function Stable, Patent Airway, No signs of Nausea or vomiting and Pain level controlled  Post-op Vital Signs: Reviewed and stable  Last Vitals:  Filed Vitals:   08/04/13 1317  BP: 134/76  Pulse: 71  Temp: 36.7 C  Resp: 17    Complications: No apparent anesthesia complications

## 2013-08-05 ENCOUNTER — Encounter: Payer: Self-pay | Admitting: Internal Medicine

## 2013-08-05 MED ORDER — OXYCODONE-ACETAMINOPHEN 5-325 MG PO TABS: 1.0000 | ORAL_TABLET | Freq: Four times a day (QID) | ORAL | Status: DC | PRN

## 2013-08-05 NOTE — Progress Notes (Signed)
CSW received call from [facility] stating they cannot make a decision until Monday on whether they can take the patient or not. CSW informed patient and patient's daughter of this. CSW explained that if DC is still over the weekend, patient can dc back to [facility] and encouraged family to work with [place] to follow up with [facility] on Monday to see if they can take the patient.   Jeanette Caprice, [name], [place]

## 2013-08-05 NOTE — Consult Note (Signed)
WOC wound consult note Reason for Consult: Wound VAC application to right BKA after surgical debridement. Patient states she bumped the wound during a transfer yesterday and is concerned about the wound.  Is indicating she is in pain. Pre-medicated with Percocet prior to procedure.  Has Stage II pressure ulcer to sacrum and nonblanchable redness to right gluteal.  Silicone bordered foam dressing placed over both areas.  Wound type: Surgical site, with additional surgical debridement 08/03/13. Pressure Ulcer POA: No Measurement: RIght BKA stump :  3 cm x 6 cm x 1 cm.   Wound bed: 25% adherent yellow slough present along wound margins.  Wound bed is beefy red and nongranulating. No noted trauma from transferring.  Periwound is intact. Drainage (amount, consistency, odor) Moderate, serosanguinous drainage, no odor.  Periwound:Intact.  Dressing procedure/placement/frequency: Wound bed is filled with black Granufoam.  Tracpad is bridged up leg away from wound bed to decrease pain with negative pressure wound therapy.  Change Mon-Wed-Fri Silicone bordered foam placed over sacral and buttock pressure ulcers.  Change every 3 days and PRN soilage.  Will not follow at this time.  Please re-consult if needed.  Domenic Moras RN BSN [place] Pager [phone]

## 2013-08-05 NOTE — Progress Notes (Signed)
  [facility] Progress Note  08/05/2013 7:35 AM POD 2  Subjective:  Pt somewhat confused. Describes having pain in stump and then saying "it's fine." Daughters are present in the room   Filed Vitals:   08/05/13 0400  BP: 141/62  Pulse: 70  Temp: 98.2 F (36.8 C)  Resp: 18    Physical Exam: Incisions:  Stump with wound vac intact with minimal serosanguinous drainage Extremities:  Mobilizing stump without difficulty  CBC    Component Value Date/Time   WBC 7.9 08/02/2013 1528   RBC 3.64* 08/02/2013 1528   RBC 3.64* 06/20/2009 0730   HGB 10.1* 08/02/2013 1528   HCT 31.0* 08/02/2013 1528   PLT 226 08/02/2013 1528   MCV 85.2 08/02/2013 1528   MCH 27.7 08/02/2013 1528   MCHC 32.6 08/02/2013 1528   RDW 14.4 08/02/2013 1528   LYMPHSABS 2.1 05/18/2013 1126   MONOABS 0.6 05/18/2013 1126   EOSABS 0.1 05/18/2013 1126   BASOSABS 0.0 05/18/2013 1126    BMET    Component Value Date/Time   NA 138 08/03/2013 0612   K 4.5 08/03/2013 0612   CL 103 08/03/2013 0612   CO2 22 08/03/2013 0612   GLUCOSE 85 08/03/2013 0612   BUN 31* 08/03/2013 0612   CREATININE 1.18* 08/03/2013 0612   CALCIUM 9.6 08/03/2013 0612   GFRNONAA 40* 08/03/2013 0612   GFRAA 46* 08/03/2013 0612    INR    Component Value Date/Time   INR 1.02 08/02/2013 1528     Intake/Output Summary (Last 24 hours) at 08/05/13 0735 Last data filed at 08/05/13 0400  Gross per 24 hour  Intake    750 ml  Output   1616 ml  Net   -866 ml     Assessment/Plan:  80 y.o. female is s/p right below knee amputation  POD 2 Vac dressing change today. Continue antibiotics. Lovenox for DVT prophylaxis.   Patient sustained contained fall last night with transfer from bedside commode to bed and hit her stump. Complains of pain this am. There is no erythema nor increased drainage from wound vac. Amputation site looks fine. Daughters are concerned about possible discharge to SNF today. Discussed with Dr. Donnetta Hutching and will discharge tomorrow.  Patient does not have final SNF placement yet. Will discuss with social work.    Virgina Jock, PA-C [facility] Office: [phone] Pager: [phone] 08/05/2013 7:35 AM

## 2013-08-05 NOTE — Progress Notes (Addendum)
Update: CSW spoke to patient and informed patient that Education officer, museum faxed information over to [facility] and they are reviewing clinicals. CSW asked patient if patient had a second option if they are not able to offer a bed and patient stated that she would just go back to [facility] if [facility] cannot take her. CSW explained that if patient is medically ready for dc over the weekend and [facility] is not able to take her, she may have to return back to [facility] until [facility] offers a bed. Patient verbalized understanding. CSW notified SW Director.  CSW went to speak to patient today regarding dc back to [facility] tomorrow. Patient states she has now changed her mind and would like to go to [facility] in [facility]. CSW explained that social worker will fax information over to them, but because of last minute decision and patient having a wound vac as well as MD note stating DC tomorrow, social worker explained that patient may not be able to DC to [facility] on a Saturday as a new patient. Facility would need to order wound vac and have a bed available. CSW explained that patient may have to go back to [facility] bed available. CSW notified family of this and MD.  Jeanette Caprice, [name], [place]

## 2013-08-05 NOTE — Progress Notes (Signed)
Clinical Social Work Department CLINICAL SOCIAL WORK PLACEMENT NOTE 08/05/2013  Patient:  KAILEI, COWENS  Account Number:  [id number] Admit date:  08/02/2013  Clinical Social Worker:  Megan Salon  Date/time:  08/05/2013 02:56 PM  Clinical Social Work is seeking post-discharge placement for this patient at the following level of care:   [place]   (*CSW will update this form in Epic as items are completed)   08/05/2013  Patient/family provided with [place] Department of Clinical Social Work's list of facilities offering this level of care within the geographic area requested by the patient (or if unable, by the patient's family).  08/05/2013  Patient/family informed of their freedom to choose among providers that offer the needed level of care, that participate in Medicare, Medicaid or managed care program needed by the patient, have an available bed and are willing to accept the patient.  08/05/2013  Patient/family informed of [facility]' ownership interest in [facility], as well as of the fact that they are under no obligation to receive care at this facility.  PASARR submitted to EDS on  PASARR number received on   FL2 transmitted to all facilities in geographic area requested by pt/family on  08/05/2013 FL2 transmitted to all facilities within larger geographic area on   Patient informed that his/her managed care company has contracts with or will negotiate with  certain facilities, including the following:     Patient/family informed of bed offers received:   Patient chooses bed at  Physician recommends and patient chooses bed at    Patient to be transferred to  on   Patient to be transferred to facility by  Patient and family notified of transfer on  Name of family member notified:    The following physician request were entered in Epic:   Additional Comments:  Jeanette Caprice, [name], [place]

## 2013-08-05 NOTE — Discharge Summary (Signed)
[facility] Discharge Summary  Regina Hamilton 02-Dec-1923 80 y.o. female  [id number]  Admission Date: 08/02/2013  Discharge Date: 08/06/2013  Physician: Rosetta Posner, MD  Admission Diagnosis: infected Rt BKA   HPI:   This is a 80 y.o. female presented with poorly healing below knee amputation. Admitted from the office with plan to take her to the operating room on the following day Hospital Course:  The patient was admitted to the hospital and taken to the operating room on 08/02/2013 - 08/03/2013 and underwent: Debridement and negative pressure wound vacuum   The pt tolerated the procedure well and was transported to the PACU in good condition.   The remainder of the hospital course consisted of increasing mobilization and increasing intake of solids without difficulty.  CBC    Component Value Date/Time   WBC 7.9 08/02/2013 1528   RBC 3.64* 08/02/2013 1528   RBC 3.64* 06/20/2009 0730   HGB 10.1* 08/02/2013 1528   HCT 31.0* 08/02/2013 1528   PLT 226 08/02/2013 1528   MCV 85.2 08/02/2013 1528   MCH 27.7 08/02/2013 1528   MCHC 32.6 08/02/2013 1528   RDW 14.4 08/02/2013 1528   LYMPHSABS 2.1 05/18/2013 1126   MONOABS 0.6 05/18/2013 1126   EOSABS 0.1 05/18/2013 1126   BASOSABS 0.0 05/18/2013 1126    BMET    Component Value Date/Time   NA 138 08/03/2013 0612   K 4.5 08/03/2013 0612   CL 103 08/03/2013 0612   CO2 22 08/03/2013 0612   GLUCOSE 85 08/03/2013 0612   BUN 31* 08/03/2013 0612   CREATININE 1.18* 08/03/2013 0612   CALCIUM 9.6 08/03/2013 0612   GFRNONAA 40* 08/03/2013 0612   GFRAA 46* 08/03/2013 0612     Discharge Instructions:   The patient is discharged to nursing facility with extensive instructions on wound care and progressive ambulation.  They are instructed not to drive or perform any heavy lifting until returning to see the physician in his office.    Discharge Diagnosis:  infected Rt BKA  Secondary Diagnosis: Patient Active Problem List   Diagnosis  Date Noted  . Infection (chronic) of amputation stump 08/02/2013  . Wound drainage-Right  ABK 07/19/2013  . Non-healing surgical wound-Right  ABK 07/19/2013  . Pain in limb-Right Thigh/Leg 05/24/2013  . Swelling of limb -Rightt foot 05/24/2013  . Critical lower limb ischemia 05/24/2013  . Severe protein-calorie malnutrition 05/24/2013  . Chronic ulcer of right foot 05/18/2013  . Unspecified protein-calorie malnutrition 05/10/2013  . Cellulitis of right foot 05/08/2013  . Peripheral neuropathy 01/17/2013  . Atrioventricular block, complete s/p AV junction ablation 11/19/2012  . Atherosclerosis of native arteries of the extremities with ulceration(440.23) 09/29/2012  . Chronic diastolic heart failure 63/14/9702  . Dyspnea 09/02/2012  . Chronic venous insufficiency 07/07/2012  . [employer] 01/16/2012  . Cholelithiasis 05/08/2011  . CAROTID ARTERY STENOSIS, BILATERAL 03/14/2010  . UNSPECIFIED PERIPHERAL VASCULAR DISEASE 03/14/2010  . Atrial fibrillation 10/11/2009  . ANEMIA, SECONDARY TO ACUTE BLOOD LOSS 11/02/2007  . OTHER AND UNSPECIFIED HYPERLIPIDEMIA 07/09/2007  . LABYRINTHITIS, CHRONIC 07/09/2007  . Other malaise and fatigue 07/09/2007  . OSTEOARTHRITIS 03/17/2007  . HYPERTENSION 11/27/2006   Past Medical History  Diagnosis Date  . Peripheral vascular disease   . Carotid artery occlusion   . Hypertension   . Hyperlipidemia   . Pacemaker 2008    [facility] in [place], [place]  . Dysrhythmia     atrial fibrilation  . Anxiety   .  Chronic venous insufficiency     B  . Atrioventricular block, complete s/p AV junction ablation 11/19/2012  . Neuromuscular disorder     periphreal neuroptahty  . Ischemia of right BKA site 05/26/2013  . Kidney stones     "never had OR"  . Complication of anesthesia     "NO VERSED!!!!!!!!!!!!!!!;  becomes extremely combative; post anesthesia psychosis" (08/02/2013)  . PAD (peripheral artery disease)   . Arthritis      "HANDS" (08/02/2013)  . Spinal stenosis of lumbar region at multiple levels   . Scoliosis   . Chronic back pain     "all over back" (08/02/2013)  . Depression   . Basal cell carcinoma        Medication List    ASK your doctor about these medications       acetaminophen 500 MG chewable tablet  Commonly known as:  TYLENOL  Chew 1,000 mg by mouth every 6 (six) hours as needed for pain.     ALPRAZolam 0.25 MG tablet  Commonly known as:  XANAX  Take 0.25 mg by mouth at bedtime.     amLODipine 10 MG tablet  Commonly known as:  NORVASC  Take 1 tablet (10 mg total) by mouth daily.     aspirin EC 81 MG tablet  Take 81 mg by mouth daily.     bisacodyl 10 MG suppository  Commonly known as:  DULCOLAX  Place 10 mg rectally as needed for moderate constipation.     clopidogrel 75 MG tablet  Commonly known as:  PLAVIX  Take 75 mg by mouth daily.     cyclobenzaprine 5 MG tablet  Commonly known as:  FLEXERIL  Take 5 mg by mouth 2 (two) times daily as needed for muscle spasms.     docusate sodium 100 MG capsule  Commonly known as:  COLACE  Take 200 mg by mouth daily.     dorzolamide 2 % ophthalmic solution  Commonly known as:  TRUSOPT  Place 1 drop into the left eye 2 (two) times daily.     erythromycin ophthalmic ointment  1 application 3 (three) times daily. Apply to left lid for 14 days     fentaNYL 25 MCG/HR patch  Commonly known as:  DURAGESIC  Place 1 patch (25 mcg total) onto the skin every 3 (three) days.     ferrous sulfate 325 (65 FE) MG tablet  Take 325 mg by mouth daily with breakfast.     gabapentin 100 MG capsule  Commonly known as:  NEURONTIN  Take 100 mg by mouth 3 (three) times daily.     lisinopril 20 MG tablet  Commonly known as:  PRINIVIL,ZESTRIL  Take 1 tablet (20 mg total) by mouth 2 (two) times daily.     loteprednol 0.5 % ophthalmic suspension  Commonly known as:  LOTEMAX  Place 1 drop into both eyes daily.     magnesium hydroxide 400 MG/5 ML  Susp  Commonly known as:  MILK OF MAGNESIA  Take 30 mLs by mouth daily as needed (constipation).     metoprolol succinate 25 MG 24 hr tablet  Commonly known as:  TOPROL XL  Take 0.5 tablets (12.5 mg total) by mouth daily.     multivitamin with minerals Tabs tablet  Take 1 tablet by mouth daily at 12 noon.     oxyCODONE-acetaminophen 5-325 MG per tablet  Commonly known as:  PERCOCET/ROXICET  Take by mouth every 6 (six) hours as needed for moderate  pain or severe pain.     polyethylene glycol packet  Commonly known as:  MIRALAX / GLYCOLAX  Take 17 g by mouth daily as needed for mild constipation.     potassium chloride SA 20 MEQ tablet  Commonly known as:  K-DUR,KLOR-CON  Take 1 tablet (20 mEq total) by mouth daily.     psyllium 58.6 % powder  Commonly known as:  METAMUCIL  Take 1 packet by mouth as needed (constipation).     pyridoxine 100 MG tablet  Commonly known as:  B-6  Take 1 tablet (100 mg total) by mouth daily.     sulfamethoxazole-trimethoprim 800-160 MG per tablet  Commonly known as:  BACTRIM DS,SEPTRA DS  Take 1 tablet by mouth 2 (two) times daily. For 7 days     vitamin C 500 MG tablet  Commonly known as:  ASCORBIC ACID  Take 500 mg by mouth daily at 12 noon.     Vitamin D 2000 UNITS tablet  Take 4,000 Units by mouth daily at 12 noon.          Disposition: Nursing facility  Patient's condition: is Good  Follow up: 1. Dr. Donnetta Hutching in 3 weeks   Virgina Jock, PA-C [facility] [phone] 08/05/2013  2:10 PM

## 2013-08-06 NOTE — Progress Notes (Signed)
Subjective: Interval History: none.. comfortable this morning. Ready for discharge to her nursing facility  Objective: Vital signs in last 24 hours: Temp:  [97.5 F (36.4 C)-97.9 F (36.6 C)] 97.9 F (36.6 C) (06/20 0527) Pulse Rate:  [60-70] 70 (06/20 0527) Resp:  [18-20] 18 (06/20 0527) BP: (128-151)/(40-47) 150/41 mmHg (06/20 0527) SpO2:  [96 %-98 %] 98 % (06/20 0527)  Intake/Output from previous day: 06/19 0701 - 06/20 0700 In: 480 [P.O.:480] Out: 454 [Urine:450; Stool:4] Intake/Output this shift:    VAC intact. Erythema has completely resolved.  Lab Results: No results found for this basename: WBC, HGB, HCT, PLT,  in the last 72 hours BMET No results found for this basename: NA, K, CL, CO2, GLUCOSE, BUN, CREATININE, CALCIUM,  in the last 72 hours  Studies/Results: No results found. Anti-infectives: Anti-infectives   Start     Dose/Rate Route Frequency Ordered Stop   08/04/13 1100  vancomycin (VANCOCIN) 500 mg in sodium chloride 0.9 % 100 mL IVPB     500 mg 100 mL/hr over 60 Minutes Intravenous Every 24 hours 08/04/13 1037     08/03/13 1500  vancomycin (VANCOCIN) 500 mg in sodium chloride 0.9 % 100 mL IVPB  Status:  Discontinued     500 mg 100 mL/hr over 60 Minutes Intravenous Every 24 hours 08/02/13 1405 08/04/13 1037   08/02/13 1600  piperacillin-tazobactam (ZOSYN) IVPB 3.375 g     3.375 g 12.5 mL/hr over 240 Minutes Intravenous 3 times per day 08/02/13 1510     08/02/13 1500  vancomycin (VANCOCIN) IVPB 750 mg/150 ml premix     750 mg 150 mL/hr over 60 Minutes Intravenous  Once 08/02/13 1405 08/02/13 1655   08/02/13 1315  sulfamethoxazole-trimethoprim (BACTRIM DS,SEPTRA DS) 800-160 MG per tablet 1 tablet  Status:  Discontinued     1 tablet Oral Every 12 hours 08/02/13 1306 08/02/13 1313      Assessment/Plan: s/p Procedure(s): IRRIGATION AND DEBRIDEMENT EXTREMITY OF RIGHT BELOW THE KNEE AMPUTATION SITE (Right) APPLICATION OF WOUND VAC TO RIGHT LEG STUMP  (Right) DC 2 nursing facility. Followup with me on 08/16/2013. Arrangements were made for suction wound vacuum   LOS: 4 days   EARLY, TODD 08/06/2013, 9:42 AM

## 2013-08-06 NOTE — Progress Notes (Signed)
Clinical Social Worker facilitated patient discharge including contacting patient's daughter Joycelyn Schmid and  RN Cecille Rubin contacted facility to confirm patient discharge plans.  Clinical information faxed to facility and family agreeable with plan. CSW arranged ambulance transport via car with daughter  to  [facility]. RN to call report prior to discharge.Daughter plans to work with facility to arrange future move to [place].  Patient is d/c'd with a wound vac. Daughter notified to make sure facility hooks her up to their vac as soon as possible. Discussed with Cecille Rubin- RN who stated that she clamped wound vac.   Clinical Social Worker will sign off for now as social work intervention is no longer needed. Please consult [place] again if new need arises.   Kendell Bane, [place]

## 2013-08-06 NOTE — Clinical Social Work Placement (Signed)
     Clinical Social Work Department CLINICAL SOCIAL WORK PLACEMENT NOTE 08/06/2013  Patient:  Regina Hamilton, Regina Hamilton  Account Number:  [id number] Admit date:  08/02/2013  Clinical Social Worker:  Megan Salon  Date/time:  08/05/2013 02:56 PM  Clinical Social Work is seeking post-discharge placement for this patient at the following level of care:   [place]   (*CSW will update this form in Epic as items are completed)   08/05/2013  Patient/family provided with [place] Department of Clinical Social Works list of facilities offering this level of care within the geographic area requested by the patient (or if unable, by the patients family).  08/05/2013  Patient/family informed of their freedom to choose among providers that offer the needed level of care, that participate in Medicare, Medicaid or managed care program needed by the patient, have an available bed and are willing to accept the patient.  08/05/2013  Patient/family informed of [facility] ownership interest in [facility], as well as of the fact that they are under no obligation to receive care at this facility.  PASARR submitted to EDS on  PASARR number received on   FL2 transmitted to all facilities in geographic area requested by pt/family on  08/05/2013 FL2 transmitted to all facilities within larger geographic area on   Patient informed that his/her managed care company has contracts with or will negotiate with  certain facilities, including the following:   NA  Has exisiting PASARR     Patient/family informed of bed offers received:   Patient chooses bed at Other Physician recommends and patient chooses bed at    Patient to be transferred to Other on  08/06/2013 Patient to be transferred to facility by Car with daughter Patient and family notified of transfer on 08/06/2013 Name of family member notified:  Joycelyn Schmid- daughter  The following physician request were entered in  Epic:   Additional Comments: 08/06/13  OK per MD for d/c today back to [facility]. Daughter insists on transport via car.  Patient has wound vac which was clamped per RN.  Daughter is seeking change in facility and will work on this once back at the facility. Nursing called report. Patient and daughter are pleased to be leaving the hospital. All questions/concerns answered.CSW signing off.  Lorie Phenix. [place], [place]  (weekend coverage)

## 2013-08-07 ENCOUNTER — Encounter (HOSPITAL_COMMUNITY): Payer: Self-pay | Admitting: Vascular Surgery

## 2013-08-15 ENCOUNTER — Encounter: Payer: Self-pay | Admitting: Vascular Surgery

## 2013-08-16 ENCOUNTER — Encounter: Payer: Self-pay | Admitting: Vascular Surgery

## 2013-08-16 ENCOUNTER — Ambulatory Visit (INDEPENDENT_AMBULATORY_CARE_PROVIDER_SITE_OTHER): Payer: Self-pay | Admitting: Vascular Surgery

## 2013-08-16 VITALS — BP 152/117 | HR 65 | Temp 98.4°F | Ht 64.0 in | Wt 113.0 lb

## 2013-08-16 DIAGNOSIS — I739 Peripheral vascular disease, unspecified: Secondary | ICD-10-CM

## 2013-08-16 DIAGNOSIS — T8189XD Other complications of procedures, not elsewhere classified, subsequent encounter: Secondary | ICD-10-CM

## 2013-08-16 DIAGNOSIS — Z5189 Encounter for other specified aftercare: Secondary | ICD-10-CM

## 2013-08-16 DIAGNOSIS — T8189XA Other complications of procedures, not elsewhere classified, initial encounter: Secondary | ICD-10-CM

## 2013-08-16 DIAGNOSIS — L98499 Non-pressure chronic ulcer of skin of other sites with unspecified severity: Principal | ICD-10-CM

## 2013-08-16 NOTE — Progress Notes (Signed)
Today for followup of her right below-knee amputation. She underwent a dictation in April 2015. She initially had good healing of this but presented several weeks ago with progressive wound in her dictation site. She was taken to the operating room the following day and underwent debridement of visits and undermined area. This was above the level of the fascia. She had some surrounding erythema which quickly resolved. She was discharged in nursing facility with a wound vacuum system. She is seeing physician for a wound care at that facility the do to a sacral wound is well.  On physical exam today she does have some fibrinous exudate the base of her BKA wound and this there is some fascia which was debrided in the office today to. There is no surrounding erythema. On her left leg she does have a superficial ulcer and the superficial laceration from striking her wheelchair. She does have a 2+ left popliteal graft pulse and a good flow to this area.  A long discussion with the patient and her daughters present. I would recommend switching to normal saline wet-to-dry dressing which began at the nursing facility. We will see her again in one month for continued followup. I did explain that this still at risk for nonhealing eventual higher level of amputation but currently this does appear to be progressing satisfactorily

## 2013-09-15 ENCOUNTER — Telehealth: Payer: Self-pay | Admitting: *Deleted

## 2013-09-15 NOTE — Telephone Encounter (Signed)
Regina Hamilton ( Daughter) called to report that Regina Hamilton is doing well. She has been seeing the Wound clinic at [facility] and they have applied a KCI wound Vac. This has helped a lot per daughter. She wanted to postpone her mother's appt for another month; changed it to 11-08-13. Regina Hamilton reports that the sacral ulcer is also almost completely healed. I told her that waiting another month would be fine but if she had any signs of infection or wound breakdown, they should call us and be worked in to the schedule.

## 2013-09-16 ENCOUNTER — Telehealth: Payer: Self-pay

## 2013-09-16 NOTE — Telephone Encounter (Signed)
Rec'd phone call from Physical Therapist, Rip Harbour, that pt's. PT will be placed on hold at this time.  Reported the pt. has reached point in her therapy, that she is successfully doing everything she has been taught through PT, and able to be independent.  Stated that once pt. is fitted for right leg prosthesis, Physical Therapy will work with her for training with the new prosthesis.

## 2013-09-27 ENCOUNTER — Ambulatory Visit: Payer: Medicare Other | Admitting: Vascular Surgery

## 2013-09-28 ENCOUNTER — Encounter: Payer: Self-pay | Admitting: Internal Medicine

## 2013-09-28 NOTE — Assessment & Plan Note (Signed)
Pain is mostly PAD related, ischemic Continue with current prescription therapy as reflected on the Med list. Vasc surgery f/u

## 2013-09-28 NOTE — Assessment & Plan Note (Signed)
Continue with current prescription therapy as reflected on the Med list.  

## 2013-11-07 ENCOUNTER — Encounter: Payer: Self-pay | Admitting: Vascular Surgery

## 2013-11-08 ENCOUNTER — Ambulatory Visit (INDEPENDENT_AMBULATORY_CARE_PROVIDER_SITE_OTHER): Payer: Medicare Other | Admitting: Vascular Surgery

## 2013-11-08 ENCOUNTER — Encounter: Payer: Self-pay | Admitting: Vascular Surgery

## 2013-11-08 VITALS — BP 185/52 | HR 70 | Temp 97.9°F | Resp 14 | Ht 64.0 in | Wt 115.0 lb

## 2013-11-08 DIAGNOSIS — Z5189 Encounter for other specified aftercare: Secondary | ICD-10-CM

## 2013-11-08 DIAGNOSIS — T8189XA Other complications of procedures, not elsewhere classified, initial encounter: Secondary | ICD-10-CM

## 2013-11-08 DIAGNOSIS — T8189XD Other complications of procedures, not elsewhere classified, subsequent encounter: Secondary | ICD-10-CM

## 2013-11-08 NOTE — Progress Notes (Signed)
Here today for followup of right below knee amputation. This was initially of April 2015 which nonhealing this was returned the operating room on 08/03/2013 for a debridement and VAC placement. She takes a rehabilitation center and is doing well with this. Her incision today is almost completely healed. The skin edges to completely healed and there is some excoriation under the undersurface near the skin incision. She is here today with her daughter  She continues to do well as far as her mobilization and motivation. She will continue followup with outpatient wound Center and see [place] with noninvasive vascular followup as planned.

## 2013-11-22 ENCOUNTER — Ambulatory Visit (INDEPENDENT_AMBULATORY_CARE_PROVIDER_SITE_OTHER): Payer: Medicare Other | Admitting: Internal Medicine

## 2013-11-22 ENCOUNTER — Encounter: Payer: Self-pay | Admitting: Internal Medicine

## 2013-11-22 VITALS — BP 160/56 | HR 70 | Ht 63.0 in

## 2013-11-22 DIAGNOSIS — Z45018 Encounter for adjustment and management of other part of cardiac pacemaker: Secondary | ICD-10-CM

## 2013-11-22 DIAGNOSIS — I10 Essential (primary) hypertension: Secondary | ICD-10-CM

## 2013-11-22 DIAGNOSIS — Z950 Presence of cardiac pacemaker: Secondary | ICD-10-CM

## 2013-11-22 DIAGNOSIS — Z23 Encounter for immunization: Secondary | ICD-10-CM

## 2013-11-22 DIAGNOSIS — I4819 Other persistent atrial fibrillation: Secondary | ICD-10-CM

## 2013-11-22 DIAGNOSIS — L98499 Non-pressure chronic ulcer of skin of other sites with unspecified severity: Secondary | ICD-10-CM

## 2013-11-22 DIAGNOSIS — I739 Peripheral vascular disease, unspecified: Secondary | ICD-10-CM

## 2013-11-22 DIAGNOSIS — I481 Persistent atrial fibrillation: Secondary | ICD-10-CM

## 2013-11-22 DIAGNOSIS — I442 Atrioventricular block, complete: Secondary | ICD-10-CM

## 2013-11-22 LAB — MDC_IDC_ENUM_SESS_TYPE_INCLINIC
Brady Statistic RV Percent Paced: 98 %
Date Time Interrogation Session: 20151006040000
Lead Channel Pacing Threshold Amplitude: 0.5 V
Lead Channel Pacing Threshold Pulse Width: 0.5 ms
Lead Channel Setting Pacing Amplitude: 2.4 V
Lead Channel Setting Pacing Pulse Width: 0.5 ms
MDC IDC MSMT LEADCHNL RV IMPEDANCE VALUE: 460 Ohm
MDC IDC PG SERIAL: 137331
MDC IDC SET LEADCHNL RV SENSING SENSITIVITY: 2.5 mV
MDC IDC STAT BRADY RA PERCENT PACED: 0 %

## 2013-11-22 NOTE — Patient Instructions (Signed)
Your physician wants you to follow-up in: 6 months with the device clinic & 1 year with Dr. Caryl Comes. You will receive a reminder letter in the mail two months in advance. If you don't receive a letter, please call our office to schedule the follow-up appointment.  Flu shot today.

## 2013-11-22 NOTE — Progress Notes (Signed)
Patient Care Team: Cassandria Anger, MD as PCP - General (Internal Medicine) Mauri Pole, MD (Orthopedic Surgery) Deboraha Sprang, MD (Cardiology) Margaretha Sheffield, MD   HPI  Regina Hamilton is a 80 y.o. female Seen in followup for atrial fibrillation treated with AV junction ablation and pacemaker implantation 2010.   She was hospitalized July 2014 for acute on chronic HFpEF  Attempts to use anticoagulation with Coumadin failed because of a rash. She has been treated with aspirin.  She has peripheral vascular disease and underwent angioplasty of her lower extremities by Dr. Doren Custard with improvement in her ambulation. She then underwent amputation with need for revision   Echocardiogram 7/14 demonstrated normal left ventricular function  The patient denies chest pain, shortness of breath, nocturnal dyspnea, orthopnea or peripheral edema.  There have been no palpitations, lightheadedness or syncope.  Her limitations related to pain in her feet.   Past Medical History  Diagnosis Date  . Peripheral vascular disease   . Carotid artery occlusion   . Hypertension   . Hyperlipidemia   . Pacemaker 2008    [facility] in [place], [place]  . Dysrhythmia     atrial fibrilation  . Anxiety   . Chronic venous insufficiency     B  . Atrioventricular block, complete s/p AV junction ablation 11/19/2012  . Neuromuscular disorder     periphreal neuroptahty  . Ischemia of right BKA site 05/26/2013  . Kidney stones     "never had OR"  . Complication of anesthesia     "NO VERSED!!!!!!!!!!!!!!!;  becomes extremely combative; post anesthesia psychosis" (08/02/2013)  . PAD (peripheral artery disease)   . Arthritis     "HANDS" (08/02/2013)  . Spinal stenosis of lumbar region at multiple levels   . Scoliosis   . Chronic back pain     "all over back" (08/02/2013)  . Depression   . Basal cell carcinoma     Past Surgical History  Procedure Laterality Date  . Anterior cervical  decomp/discectomy fusion  1970  . Atrial ablation surgery  2010  . Pr vein bypass graft,aorto-fem-pop    . Total hip arthroplasty Right     S/P fracture  . Cholecystectomy  05/20/11  . Angioplasty  03/01/2012  . Insert / replace / remove pacemaker  2010  . Corneal transplant Bilateral   . Back surgery    . Abdominal aortagram  Feb. 16, 2015  . Amputation Right 05/25/2013    Procedure: AMPUTATION BELOW KNEE;  Surgeon: Rosetta Posner, MD;  Location: [facility] OR;  Service: Vascular;  Laterality: Right;  . Hemiarthroplasty hip Left     S/P fracture  . Revision total hip arthroplasty Left   . Cataract extraction w/ intraocular lens  implant, bilateral Bilateral   . Knee arthroscopy Bilateral   . Fixation kyphoplasty thoracic spine  ~ 09/2010  . Basal cell carcinoma excision      "several removed off face, hands, arms"  . I&d extremity Right 08/03/2013    Procedure: IRRIGATION AND DEBRIDEMENT EXTREMITY OF RIGHT BELOW THE KNEE AMPUTATION SITE;  Surgeon: Rosetta Posner, MD;  Location: [place];  Service: Vascular;  Laterality: Right;  . Application of wound vac Right 08/03/2013    Procedure: APPLICATION OF WOUND VAC TO RIGHT LEG STUMP;  Surgeon: Rosetta Posner, MD;  Location: [facility] OR;  Service: Vascular;  Laterality: Right;    Current Outpatient Prescriptions  Medication Sig Dispense Refill  . acetaminophen (TYLENOL) 500 MG chewable tablet  Chew 1,000 mg by mouth every 6 (six) hours as needed for pain.      Marland Kitchen ALPRAZolam (XANAX) 0.25 MG tablet Take 0.25 mg by mouth at bedtime.      Marland Kitchen amLODipine (NORVASC) 10 MG tablet Take 1 tablet (10 mg total) by mouth daily.  30 tablet  11  . aspirin EC 81 MG tablet Take 81 mg by mouth daily.      . Cholecalciferol (VITAMIN D) 2000 UNITS tablet Take 4,000 Units by mouth daily at 12 noon.       . clopidogrel (PLAVIX) 75 MG tablet Take 75 mg by mouth daily.      . cyclobenzaprine (FLEXERIL) 5 MG tablet Take 5 mg by mouth 2 (two) times daily as needed for muscle spasms.      Marland Kitchen  docusate sodium (COLACE) 100 MG capsule Take 200 mg by mouth daily.       . dorzolamide (TRUSOPT) 2 % ophthalmic solution Place 1 drop into the left eye 2 (two) times daily.  10 mL  11  . erythromycin ophthalmic ointment 1 application 3 (three) times daily. Apply to left lid for 14 days      . ferrous sulfate 325 (65 FE) MG tablet Take 325 mg by mouth daily with breakfast.      . gabapentin (NEURONTIN) 100 MG capsule Take 100 mg by mouth 3 (three) times daily.      Marland Kitchen lisinopril (PRINIVIL,ZESTRIL) 20 MG tablet Take 1 tablet (20 mg total) by mouth 2 (two) times daily.  60 tablet  0  . loteprednol (LOTEMAX) 0.5 % ophthalmic suspension Place 1 drop into both eyes daily.      . metoprolol succinate (TOPROL XL) 25 MG 24 hr tablet Take 0.5 tablets (12.5 mg total) by mouth daily.  30 tablet  0  . Multiple Vitamin (MULTIVITAMIN WITH MINERALS) TABS tablet Take 1 tablet by mouth daily at 12 noon.       . polyethylene glycol (MIRALAX / GLYCOLAX) packet Take 17 g by mouth daily as needed for mild constipation.      . potassium chloride SA (K-DUR,KLOR-CON) 20 MEQ tablet Take 1 tablet (20 mEq total) by mouth daily.  30 tablet  0  . pyridOXINE (B-6) 100 MG tablet Take 1 tablet (100 mg total) by mouth daily.  30 tablet  0  . vitamin C (ASCORBIC ACID) 500 MG tablet Take 500 mg by mouth daily at 12 noon.        No current facility-administered medications for this visit.    Allergies  Allergen Reactions  . Avelox [Moxifloxacin Hcl In Nacl] Other (See Comments)    tongue swelling  . Clonidine Derivatives Other (See Comments)    Pt's daughter states she has severe hallucinations  . Latanoprost Anaphylaxis    redness  . Moxifloxacin Other (See Comments)     Tongue swelling; mouth ulcers; confusion  . Pentoxifylline     Severe SOB, mental confusion per daughter  . Versed [Midazolam] Other (See Comments)     hallucinations per daughter  . Warfarin Sodium Rash  . Alphagan P [Brimonidine Tartrate]  Photosensitivity    Redness bilateral eye  . Contrast Media [Iodinated Diagnostic Agents] Rash    Contrast dye.  . Dilaudid [Hydromorphone Hcl] Nausea And Vomiting    IV formulation only  . Diltiazem Hcl Hives  . Doxycycline Nausea And Vomiting  . Hydralazine Itching  . Iron Rash  . Meloxicam Other (See Comments)    Odd feeling  Review of Systems negative except from HPI and PMH  Physical Exam BP 160/56  Pulse 70  Ht 5\' 3"  (1.6 m) Well developed and nourished in no acute distress HENT normal Neck supple with JVP-flat Clear Device pocket well healed; without hematoma or erythema.  There is no tethering Regular rate and rhythm, 2/ 6 systolic murmur Abd-soft with active BS No Clubbing cyanosis edema   Amputation LBKA Skin-warm and dry; chronic venous insufficiency changes A & Oriented  Grossly normal sensory and motor function    ECG demonstrates atrial fibrillation with ventricular pacing  Assessment and Plan  Atrial fibrillation-permanent  Complete heart block s/p AV junction ablation  Pacemaker-[employer]  Hypertension   Blood pressure is reasonably controlled. She says at home is mostly about 130  She has decided not to pursue anticoagulation; she will continue on aspirin and Plavix.  Pacemaker function was normal  Heart rate control accomplished by AV junction ablation

## 2013-12-20 ENCOUNTER — Encounter: Payer: Self-pay | Admitting: Vascular Surgery

## 2013-12-21 ENCOUNTER — Ambulatory Visit (INDEPENDENT_AMBULATORY_CARE_PROVIDER_SITE_OTHER): Payer: Medicare Other | Admitting: Family

## 2013-12-21 ENCOUNTER — Ambulatory Visit (HOSPITAL_COMMUNITY)
Admission: RE | Admit: 2013-12-21 | Discharge: 2013-12-21 | Disposition: A | Discharge status: Home / Self Care | Payer: Medicare Other | Source: Ambulatory Visit | Attending: Vascular Surgery | Admitting: Vascular Surgery

## 2013-12-21 ENCOUNTER — Ambulatory Visit: Payer: Medicare Other | Admitting: Vascular Surgery

## 2013-12-21 ENCOUNTER — Encounter: Payer: Self-pay | Admitting: Family

## 2013-12-21 ENCOUNTER — Ambulatory Visit (INDEPENDENT_AMBULATORY_CARE_PROVIDER_SITE_OTHER)
Admission: RE | Admit: 2013-12-21 | Discharge: 2013-12-21 | Disposition: A | Discharge status: Home / Self Care | Payer: Medicare Other | Source: Ambulatory Visit | Attending: Vascular Surgery | Admitting: Vascular Surgery

## 2013-12-21 ENCOUNTER — Other Ambulatory Visit (HOSPITAL_COMMUNITY): Payer: Medicare Other

## 2013-12-21 VITALS — BP 150/67 | HR 69 | Resp 16 | Ht 64.0 in | Wt 113.0 lb

## 2013-12-21 DIAGNOSIS — I6523 Occlusion and stenosis of bilateral carotid arteries: Secondary | ICD-10-CM | POA: Diagnosis not present

## 2013-12-21 DIAGNOSIS — L98499 Non-pressure chronic ulcer of skin of other sites with unspecified severity: Secondary | ICD-10-CM

## 2013-12-21 DIAGNOSIS — M79672 Pain in left foot: Secondary | ICD-10-CM

## 2013-12-21 DIAGNOSIS — I739 Peripheral vascular disease, unspecified: Secondary | ICD-10-CM

## 2013-12-21 DIAGNOSIS — I70203 Unspecified atherosclerosis of native arteries of extremities, bilateral legs: Secondary | ICD-10-CM

## 2013-12-21 NOTE — Progress Notes (Signed)
[facility] [place] HISTORY AND PHYSICAL   MRN : [id number]  History of Present Illness:   Regina Hamilton is a 80 y.o. female patient of Dr. Donnetta Hutching and Scot Dock who is s/p right below-knee amputation on 05/25/2013 and debridement of right below the knee amputation site and VAC placement on 08/03/13. She returns today for follow up of her PAD and carotid artery stenosis . Right stump wound is slowly closing, is attending wound care clinic weekly in [place], Regranex is being used, per daughter. Right stump wound is dressed twice daily either by daughter, home health which visits 3x/week, or wound care clinic.   Pt is no longer having any pain other than rare phantom pain. She walks twice daily with a walker, denies claudication pain in left leg with this, is in the process of being fitted for a right BKA prosthesis, but right BKA wound must heal before prosthesis can be used. She is doing leg exercises as prescribed.  The patient denies non healing wounds in her left LE.  Patient has Negative history of TIA or stroke symptom.  The patient denies amaurosis fugax or monocular blindness.  The patient  denies facial drooping.  Pt. denies hemiplegia.  The patient denies receptive or expressive aphasia.    Pt Diabetic: No Pt smoker: non-smoker  Pt meds include: Statin :No, states her cholesterol is OK. Betablocker: Yes ASA: Yes Other anticoagulants/antiplatelets: Plavix  Current Outpatient Prescriptions  Medication Sig Dispense Refill  . acetaminophen (TYLENOL) 500 MG chewable tablet Chew 1,000 mg by mouth as needed for pain.     Marland Kitchen ALPRAZolam (XANAX) 0.25 MG tablet Take 0.25 mg by mouth at bedtime.    Marland Kitchen amLODipine (NORVASC) 10 MG tablet Take 1 tablet (10 mg total) by mouth daily. 30 tablet 11  . aspirin EC 81 MG tablet Take 81 mg by mouth daily.    . Cholecalciferol (VITAMIN D) 2000 UNITS tablet Take 4,000 Units by mouth daily at 12 noon.     . clopidogrel (PLAVIX) 75  MG tablet Take 75 mg by mouth daily.    . cyclobenzaprine (FLEXERIL) 5 MG tablet Take 5 mg by mouth as needed for muscle spasms.     Marland Kitchen docusate sodium (COLACE) 100 MG capsule Take 200 mg by mouth daily.     . dorzolamide (TRUSOPT) 2 % ophthalmic solution Place 1 drop into the left eye 2 (two) times daily. 10 mL 11  . erythromycin ophthalmic ointment 1 application as needed. Apply to left lid for 14 days    . ferrous sulfate 325 (65 FE) MG tablet Take 325 mg by mouth daily with breakfast.    . gabapentin (NEURONTIN) 100 MG capsule Take 100 mg by mouth 3 (three) times daily.    Marland Kitchen lisinopril (PRINIVIL,ZESTRIL) 20 MG tablet Take 1 tablet (20 mg total) by mouth 2 (two) times daily. 60 tablet 0  . loteprednol (LOTEMAX) 0.5 % ophthalmic suspension Place 1 drop into both eyes daily.    . metoprolol succinate (TOPROL XL) 25 MG 24 hr tablet Take 0.5 tablets (12.5 mg total) by mouth daily. 30 tablet 0  . Multiple Vitamin (MULTIVITAMIN WITH MINERALS) TABS tablet Take 1 tablet by mouth daily at 12 noon.     . polyethylene glycol (MIRALAX / GLYCOLAX) packet Take 17 g by mouth daily as needed for mild constipation.    . potassium chloride SA (K-DUR,KLOR-CON) 20 MEQ tablet Take 1 tablet (20 mEq total) by mouth daily. 30 tablet 0  .  pyridOXINE (B-6) 100 MG tablet Take 1 tablet (100 mg total) by mouth daily. 30 tablet 0  . vitamin C (ASCORBIC ACID) 500 MG tablet Take 500 mg by mouth daily at 12 noon.      No current facility-administered medications for this visit.    Past Medical History  Diagnosis Date  . Peripheral vascular disease   . Carotid artery occlusion   . Hypertension   . Hyperlipidemia   . Pacemaker 2008    [facility] in [place], [place]  . Dysrhythmia     atrial fibrilation  . Anxiety   . Chronic venous insufficiency     B  . Atrioventricular block, complete s/p AV junction ablation 11/19/2012  . Neuromuscular disorder     periphreal neuroptahty  . Ischemia of right BKA site  05/26/2013  . Kidney stones     "never had OR"  . Complication of anesthesia     "NO VERSED!!!!!!!!!!!!!!!;  becomes extremely combative; post anesthesia psychosis" (08/02/2013)  . PAD (peripheral artery disease)   . Arthritis     "HANDS" (08/02/2013)  . Spinal stenosis of lumbar region at multiple levels   . Scoliosis   . Chronic back pain     "all over back" (08/02/2013)  . Depression   . Basal cell carcinoma     Social History History  Substance Use Topics  . Smoking status: Never Smoker   . Smokeless tobacco: Never Used  . Alcohol Use: No    Family History Family History  Problem Relation Age of Onset  . Heart disease Mother     Heart disease before age 37  . Hypertension Mother   . Varicose Veins Mother   . Peripheral vascular disease Mother   . Other Mother     amputation  . Heart disease Father     Heat Diease before age 75  . Heart disease Maternal Grandmother   . Heart disease Paternal Grandmother   . Diabetes Sister   . Hypertension Sister   . Varicose Veins Sister   . Diabetes Brother   . Hypertension Brother   . Heart attack Brother   . Hypertension Daughter     Surgical History Past Surgical History  Procedure Laterality Date  . Anterior cervical decomp/discectomy fusion  1970  . Atrial ablation surgery  2010  . Pr vein bypass graft,aorto-fem-pop    . Total hip arthroplasty Right     S/P fracture  . Cholecystectomy  05/20/11  . Angioplasty  03/01/2012  . Insert / replace / remove pacemaker  2010  . Corneal transplant Bilateral   . Back surgery    . Abdominal aortagram  Feb. 16, 2015  . Amputation Right 05/25/2013    Procedure: AMPUTATION BELOW KNEE;  Surgeon: Rosetta Posner, MD;  Location: [facility] OR;  Service: Vascular;  Laterality: Right;  . Hemiarthroplasty hip Left     S/P fracture  . Revision total hip arthroplasty Left   . Cataract extraction w/ intraocular lens  implant, bilateral Bilateral   . Knee arthroscopy Bilateral   . Fixation kyphoplasty  thoracic spine  ~ 09/2010  . Basal cell carcinoma excision      "several removed off face, hands, arms"  . I&d extremity Right 08/03/2013    Procedure: IRRIGATION AND DEBRIDEMENT EXTREMITY OF RIGHT BELOW THE KNEE AMPUTATION SITE;  Surgeon: Rosetta Posner, MD;  Location: [facility];  Service: Vascular;  Laterality: Right;  . Application of wound vac Right 08/03/2013    Procedure:  APPLICATION OF WOUND VAC TO RIGHT LEG STUMP;  Surgeon: Rosetta Posner, MD;  Location: [facility];  Service: Vascular;  Laterality: Right;    Allergies  Allergen Reactions  . Avelox [Moxifloxacin Hcl In Nacl] Other (See Comments)    tongue swelling  . Clonidine Derivatives Other (See Comments)    Pt's daughter states she has severe hallucinations  . Latanoprost Anaphylaxis    redness  . Moxifloxacin Other (See Comments)     Tongue swelling; mouth ulcers; confusion  . Pentoxifylline     Severe SOB, mental confusion per daughter  . Versed [Midazolam] Other (See Comments)     hallucinations per daughter  . Warfarin Sodium Rash  . Alphagan P [Brimonidine Tartrate] Photosensitivity    Redness bilateral eye  . Contrast Media [Iodinated Diagnostic Agents] Rash    Contrast dye.  . Dilaudid [Hydromorphone Hcl] Nausea And Vomiting    IV formulation only  . Diltiazem Hcl Hives  . Doxycycline Nausea And Vomiting  . Hydralazine Itching  . Iron Rash  . Meloxicam Other (See Comments)    Odd feeling    Current Outpatient Prescriptions  Medication Sig Dispense Refill  . acetaminophen (TYLENOL) 500 MG chewable tablet Chew 1,000 mg by mouth as needed for pain.     Marland Kitchen ALPRAZolam (XANAX) 0.25 MG tablet Take 0.25 mg by mouth at bedtime.    Marland Kitchen amLODipine (NORVASC) 10 MG tablet Take 1 tablet (10 mg total) by mouth daily. 30 tablet 11  . aspirin EC 81 MG tablet Take 81 mg by mouth daily.    . Cholecalciferol (VITAMIN D) 2000 UNITS tablet Take 4,000 Units by mouth daily at 12 noon.     . clopidogrel (PLAVIX) 75 MG tablet Take 75 mg by mouth  daily.    . cyclobenzaprine (FLEXERIL) 5 MG tablet Take 5 mg by mouth as needed for muscle spasms.     Marland Kitchen docusate sodium (COLACE) 100 MG capsule Take 200 mg by mouth daily.     . dorzolamide (TRUSOPT) 2 % ophthalmic solution Place 1 drop into the left eye 2 (two) times daily. 10 mL 11  . erythromycin ophthalmic ointment 1 application as needed. Apply to left lid for 14 days    . ferrous sulfate 325 (65 FE) MG tablet Take 325 mg by mouth daily with breakfast.    . gabapentin (NEURONTIN) 100 MG capsule Take 100 mg by mouth 3 (three) times daily.    Marland Kitchen lisinopril (PRINIVIL,ZESTRIL) 20 MG tablet Take 1 tablet (20 mg total) by mouth 2 (two) times daily. 60 tablet 0  . loteprednol (LOTEMAX) 0.5 % ophthalmic suspension Place 1 drop into both eyes daily.    . metoprolol succinate (TOPROL XL) 25 MG 24 hr tablet Take 0.5 tablets (12.5 mg total) by mouth daily. 30 tablet 0  . Multiple Vitamin (MULTIVITAMIN WITH MINERALS) TABS tablet Take 1 tablet by mouth daily at 12 noon.     . polyethylene glycol (MIRALAX / GLYCOLAX) packet Take 17 g by mouth daily as needed for mild constipation.    . potassium chloride SA (K-DUR,KLOR-CON) 20 MEQ tablet Take 1 tablet (20 mEq total) by mouth daily. 30 tablet 0  . pyridOXINE (B-6) 100 MG tablet Take 1 tablet (100 mg total) by mouth daily. 30 tablet 0  . vitamin C (ASCORBIC ACID) 500 MG tablet Take 500 mg by mouth daily at 12 noon.      No current facility-administered medications for this visit.     REVIEW OF SYSTEMS:  See HPI for pertinent positives and negatives.  Physical Examination Filed Vitals:   12/21/13 1144 12/21/13 1147  BP: 146/69 150/67  Pulse: 70 69  Resp:  16  Height:  5\' 4"  (1.626 m)  Weight:  113 lb (51.256 kg)  SpO2:  99%   Body mass index is 19.39 kg/(m^2).  General: A&O x 3, WDWN, in NAD, seated in her wheelchair.  Eyes: PERRLA.  Pulmonary: Sym exp, good air movt, CTAB, no rales, rhonchi, or wheezing.  Cardiac: RRR, Nl S1, S2, no  detected murmur.  Vascular: Vessel Right Left  Radial 2+Palpable 2+Palpable  Carotid audible without bruit audible without bruit  Aorta Not palpable N/A  Femoral 2+Palpable 2+Palpable  Popliteal Not palpable Not palpable  PT BKA Not Palpable  DP BKA Not Palpable   Gastrointestinal: soft, NTND, -G/R, no palpated masses.  Musculoskeletal: M/S 4/5 throughout, Extremities without ischemic changes. Right BKA with dressing dry and intact. Neurologic:  Motor exam as listed above     Non-Invasive Vascular Imaging (12/21/2013):   CEREBROVASCULAR DUPLEX EVALUATION    INDICATION: Carotid artery disease    PREVIOUS INTERVENTION(S): None    DUPLEX EXAM: Carotid duplex    RIGHT  LEFT  Peak Systolic Velocities (cm/s) End Diastolic Velocities (cm/s) Plaque LOCATION Peak Systolic Velocities (cm/s) End Diastolic Velocities (cm/s) Plaque  98 9 - CCA PROXIMAL 72 11 HT  74 9 - CCA MID 62 11 -  90 9 HT CCA DISTAL 405 57 HT  95 10 HT ECA 468 12 -  54 11 HT ICA PROXIMAL 196 18 HT  69 11 - ICA MID 118 18 -  75 15 - ICA DISTAL 66 15 -    1.0 ICA / CCA Ratio (PSV) 3.1  Antegrade Vertebral Flow Antegrade  945 Brachial Systolic Pressure (mmHg) 038  Triphasic Brachial Artery Waveforms Triphasic    Plaque Morphology:  HM = Homogeneous, HT = Heterogeneous, CP = Calcific Plaque, SP = Smooth Plaque, IP = Irregular Plaque     ADDITIONAL FINDINGS:     IMPRESSION: 1. Less than 40% bilateral internal carotid artery stenosis. 2. Stenosis in the distal left common carotid artery may decrease left internal carotid artery velocity. 3. Left external carotid artery stenosis.    Compared to the previous exam:  No significant change     ABI (Date: 12/21/2013)  R: BKA  L: calcified arteries, monophasic waveforms, TBI: 0.21, 35 absolute toe pressure   ASSESSMENT:  Regina Hamilton is a 80 y.o. female who is s/p right below-knee amputation on 05/25/2013 and debridement of right  below the knee amputation site and VAC placement on 08/03/13. She returns today for follow up of her PAD and carotid artery stenosis.  She has no history of stroke or TIA symptoms. Carotid Duplex today reveals less than 40% bilateral internal carotid artery stenosis; no significant change.  Left ABI has non compressible vessels and monophasic waveforms. She gets around mostly in a wheelchair, walks some with a walker, no claudication symptoms, no tissue loss in left leg, right stump wound is slowly granulating with twice daily dressing changes by daughter, once/week wound care clinic, and 3x/week home health visit.  Literature suggests that people on a statin have less incidence of complications from PAD, even on a small dose, will defer to her PCP.   PLAN:   Based on today's exam and non-invasive vascular lab results, the patient will follow up in 6 months with the following tests: carotid Duplex and ABI, see Dr.  Scot Dock. I discussed in depth with the patient the nature of atherosclerosis, and emphasized the importance of maximal medical management including strict control of blood pressure, blood glucose, and lipid levels, obtaining regular exercise, and cessation of smoking.  The patient is aware that without maximal medical management the underlying atherosclerotic disease process will progress, limiting the benefit of any interventions.  The patient was given information about stroke prevention and what symptoms should prompt the patient to seek immediate medical care.  The patient was given information about PAD including signs, symptoms, treatment, what symptoms should prompt the patient to seek immediate medical care, and risk reduction measures to take. Thank you for allowing [place] to participate in this patient's care.  Clemon Chambers, RN, MSN, FNP-C [facility] Office: [phone]  Clinic MD: Scot Dock 12/21/2013 12:01 PM

## 2013-12-21 NOTE — Patient Instructions (Signed)

## 2014-01-11 ENCOUNTER — Telehealth: Payer: Self-pay | Admitting: Internal Medicine

## 2014-01-11 MED ORDER — DORZOLAMIDE HCL 2 % OP SOLN: 1.0000 [drp] | Freq: Two times a day (BID) | OPHTHALMIC | Status: DC

## 2014-01-11 NOTE — Telephone Encounter (Signed)
Rx ref eye gtt

## 2014-01-26 ENCOUNTER — Encounter (HOSPITAL_COMMUNITY): Payer: Self-pay | Admitting: Vascular Surgery

## 2014-03-13 ENCOUNTER — Other Ambulatory Visit: Payer: Self-pay | Admitting: *Deleted

## 2014-04-26 ENCOUNTER — Ambulatory Visit (INDEPENDENT_AMBULATORY_CARE_PROVIDER_SITE_OTHER): Payer: Medicare Other | Admitting: Internal Medicine

## 2014-04-26 ENCOUNTER — Encounter: Payer: Self-pay | Admitting: Internal Medicine

## 2014-04-26 ENCOUNTER — Other Ambulatory Visit (INDEPENDENT_AMBULATORY_CARE_PROVIDER_SITE_OTHER): Payer: Medicare Other

## 2014-04-26 VITALS — BP 155/75 | HR 70 | Temp 98.7°F | Wt 122.0 lb

## 2014-04-26 DIAGNOSIS — I998 Other disorder of circulatory system: Secondary | ICD-10-CM

## 2014-04-26 DIAGNOSIS — I739 Peripheral vascular disease, unspecified: Secondary | ICD-10-CM

## 2014-04-26 DIAGNOSIS — I70203 Unspecified atherosclerosis of native arteries of extremities, bilateral legs: Secondary | ICD-10-CM

## 2014-04-26 DIAGNOSIS — I10 Essential (primary) hypertension: Secondary | ICD-10-CM

## 2014-04-26 DIAGNOSIS — I70229 Atherosclerosis of native arteries of extremities with rest pain, unspecified extremity: Secondary | ICD-10-CM

## 2014-04-26 DIAGNOSIS — L98499 Non-pressure chronic ulcer of skin of other sites with unspecified severity: Secondary | ICD-10-CM | POA: Diagnosis not present

## 2014-04-26 DIAGNOSIS — I7025 Atherosclerosis of native arteries of other extremities with ulceration: Secondary | ICD-10-CM

## 2014-04-26 LAB — CBC WITH DIFFERENTIAL/PLATELET
BASOS ABS: 0 10*3/uL (ref 0.0–0.1)
Basophils Relative: 0.5 % (ref 0.0–3.0)
EOS PCT: 1.7 % (ref 0.0–5.0)
Eosinophils Absolute: 0.1 10*3/uL (ref 0.0–0.7)
HCT: 34.2 % — ABNORMAL LOW (ref 36.0–46.0)
HEMOGLOBIN: 11.3 g/dL — AB (ref 12.0–15.0)
LYMPHS ABS: 2.6 10*3/uL (ref 0.7–4.0)
Lymphocytes Relative: 34.1 % (ref 12.0–46.0)
MCHC: 33.1 g/dL (ref 30.0–36.0)
MCV: 85 fl (ref 78.0–100.0)
MONO ABS: 0.6 10*3/uL (ref 0.1–1.0)
MONOS PCT: 8.6 % (ref 3.0–12.0)
Neutro Abs: 4.1 10*3/uL (ref 1.4–7.7)
Neutrophils Relative %: 55.1 % (ref 43.0–77.0)
Platelets: 232 10*3/uL (ref 150.0–400.0)
RBC: 4.03 Mil/uL (ref 3.87–5.11)
RDW: 15.1 % (ref 11.5–15.5)
WBC: 7.5 10*3/uL (ref 4.0–10.5)

## 2014-04-26 LAB — BASIC METABOLIC PANEL
BUN: 29 mg/dL — AB (ref 6–23)
CALCIUM: 9.4 mg/dL (ref 8.4–10.5)
CO2: 27 meq/L (ref 19–32)
Chloride: 108 mEq/L (ref 96–112)
Creatinine, Ser: 0.66 mg/dL (ref 0.40–1.20)
GFR: 89.67 mL/min (ref >60.00)
Glucose, Bld: 92 mg/dL (ref 70–99)
Potassium: 4.3 mEq/L (ref 3.5–5.1)
Sodium: 140 mEq/L (ref 135–145)

## 2014-04-26 LAB — TSH: TSH: 2.03 u[IU]/mL (ref 0.35–4.50)

## 2014-04-26 MED ORDER — ALPRAZOLAM 0.25 MG PO TABS: 0.2500 mg | ORAL_TABLET | Freq: Every day | ORAL | Status: AC

## 2014-04-26 MED ORDER — GABAPENTIN 100 MG PO CAPS: 100.0000 mg | ORAL_CAPSULE | Freq: Three times a day (TID) | ORAL | Status: AC

## 2014-04-26 MED ORDER — CLOPIDOGREL BISULFATE 75 MG PO TABS: 75.0000 mg | ORAL_TABLET | Freq: Every day | ORAL | Status: DC

## 2014-04-26 MED ORDER — LISINOPRIL 20 MG PO TABS: 20.0000 mg | ORAL_TABLET | Freq: Two times a day (BID) | ORAL | Status: AC

## 2014-04-26 MED ORDER — WHEELCHAIR MISC: Status: AC

## 2014-04-26 MED ORDER — AMLODIPINE BESYLATE 10 MG PO TABS: 10.0000 mg | ORAL_TABLET | Freq: Every day | ORAL | Status: AC

## 2014-04-26 MED ORDER — METOPROLOL SUCCINATE ER 25 MG PO TB24: 12.5000 mg | ORAL_TABLET | Freq: Every day | ORAL | Status: DC

## 2014-04-26 NOTE — Progress Notes (Signed)
   Subjective:     HPI   F/u R BKA - doing well   F/u itching all over- resolved off Hydralazine. Face was peeling on 300 mg Tekturna - resolved F/u scalp and eyebrows itching - resolved  F/u B legs new swelling and tenderness on 4/21 - better. Dr Tomi Bamberger gave her Keflex x 2-3 wks, then Doxy (still on it) - it did not help. C/o PVD, tingling, pain. C/o LBP  BP Readings from Last 3 Encounters:  04/26/14 200/58  12/21/13 150/67  11/22/13 160/56   Wt Readings from Last 3 Encounters:  04/26/14 122 lb (55.339 kg)  12/21/13 113 lb (51.256 kg)  11/08/13 115 lb (52.164 kg)   BP rechecked - ok  Review of Systems  Constitutional: Positive for fatigue. Negative for chills and unexpected weight change.  HENT: Positive for hearing loss. Negative for congestion, nosebleeds and sore throat.   Respiratory: Negative for cough, choking and wheezing.   Cardiovascular: Positive for leg swelling. Negative for chest pain and palpitations.  Gastrointestinal: Negative for nausea and abdominal pain.  Endocrine: Negative for polydipsia.  Genitourinary: Negative for flank pain, vaginal bleeding, vaginal discharge and pelvic pain.  Musculoskeletal: Positive for myalgias, arthralgias and gait problem. Negative for back pain and joint swelling.  Skin:       Redness B ankles, not warm or tender Toes are purplish B  Neurological: Negative for seizures and weakness.  Psychiatric/Behavioral: Negative for suicidal ideas and decreased concentration. The patient is not nervous/anxious.        Objective:   Physical Exam  Constitutional: She appears well-developed and well-nourished. No distress.  HENT:  Head: Normocephalic.  Nose: Nose normal.  Mouth/Throat: Oropharynx is clear and moist. No oropharyngeal exudate.  Eyes: Left eye exhibits no discharge. No scleral icterus.  Neck: No JVD present. No tracheal deviation present. No thyromegaly present.  Cardiovascular: Exam reveals no gallop and no friction  rub.   Murmur heard. irreg  Pulmonary/Chest: She has no wheezes. She has no rales. She exhibits no tenderness.  Abdominal: She exhibits no distension and no mass. There is no guarding.  Musculoskeletal: She exhibits edema. She exhibits no tenderness.  Trace B  Lymphadenopathy:    She has no cervical adenopathy.  Skin: No rash noted. There is erythema.  Psychiatric: She has a normal mood and affect. Judgment normal.  R BKA - healed Labs reviewed Walker     Assessment & Plan:

## 2014-04-26 NOTE — Progress Notes (Signed)
Pre visit review using our clinic review tool, if applicable. No additional management support is needed unless otherwise documented below in the visit note. 

## 2014-04-26 NOTE — Assessment & Plan Note (Signed)
Plavix po

## 2014-04-26 NOTE — Assessment & Plan Note (Signed)
Chronic   ELEV BP in the office - white coat HTN - BP 155/75 On Lisinopril

## 2014-04-26 NOTE — Assessment & Plan Note (Signed)
Prosthesis, walker, w/c

## 2014-05-09 ENCOUNTER — Telehealth: Payer: Self-pay | Admitting: Internal Medicine

## 2014-05-09 NOTE — Telephone Encounter (Signed)
I called pt's daughter, Manuela Schwartz and informed her of 04/26/14 stable labs. I informed her MD sent a MyChart message. She states she just hasn't had time to check MyChart.

## 2014-05-09 NOTE — Telephone Encounter (Signed)
Pt daughter called in and would like to speak to nurse about her mother labs results    Best number  [phone]

## 2014-05-18 ENCOUNTER — Encounter: Payer: Self-pay | Admitting: Internal Medicine

## 2014-05-19 ENCOUNTER — Encounter: Payer: Self-pay | Admitting: *Deleted

## 2014-05-20 ENCOUNTER — Encounter: Payer: Self-pay | Admitting: Internal Medicine

## 2014-05-29 ENCOUNTER — Ambulatory Visit (INDEPENDENT_AMBULATORY_CARE_PROVIDER_SITE_OTHER): Payer: Medicare Other | Admitting: *Deleted

## 2014-05-29 DIAGNOSIS — I482 Chronic atrial fibrillation, unspecified: Secondary | ICD-10-CM

## 2014-05-29 DIAGNOSIS — I442 Atrioventricular block, complete: Secondary | ICD-10-CM

## 2014-05-29 LAB — MDC_IDC_ENUM_SESS_TYPE_INCLINIC
Brady Statistic RV Percent Paced: 98 %
Lead Channel Impedance Value: 460 Ohm
Lead Channel Pacing Threshold Amplitude: 0.5 V
Lead Channel Pacing Threshold Pulse Width: 0.5 ms
Lead Channel Setting Pacing Amplitude: 2.4 V
Lead Channel Setting Pacing Pulse Width: 0.5 ms
MDC IDC PG SERIAL: 137331
MDC IDC SESS DTM: 20160411040000
MDC IDC SET LEADCHNL RV SENSING SENSITIVITY: 2.5 mV
MDC IDC STAT BRADY RA PERCENT PACED: 0 %

## 2014-05-29 NOTE — Progress Notes (Signed)
Pacemaker check in clinic. Normal device function. Threshold and impedance consistent with previous measurements. Device programmed to maximize longevity. Permanent AF + ASA/Plavix. No high ventricular rates noted. Device programmed at appropriate safety margins. Histogram distribution appropriate for patient activity level. Device programmed to optimize intrinsic conduction. Estimated longevity 5 years. Patient will follow up with SK in 6 months.

## 2014-06-06 ENCOUNTER — Other Ambulatory Visit: Payer: Self-pay | Admitting: Dermatology

## 2014-06-19 ENCOUNTER — Encounter: Payer: Self-pay | Admitting: Internal Medicine

## 2014-06-20 ENCOUNTER — Encounter: Payer: Self-pay | Admitting: Vascular Surgery

## 2014-06-21 ENCOUNTER — Ambulatory Visit (INDEPENDENT_AMBULATORY_CARE_PROVIDER_SITE_OTHER): Payer: Medicare Other | Admitting: Vascular Surgery

## 2014-06-21 ENCOUNTER — Ambulatory Visit (HOSPITAL_COMMUNITY)
Admission: RE | Admit: 2014-06-21 | Discharge: 2014-06-21 | Disposition: A | Discharge status: Home / Self Care | Payer: Medicare Other | Source: Ambulatory Visit | Attending: Vascular Surgery | Admitting: Vascular Surgery

## 2014-06-21 ENCOUNTER — Encounter: Payer: Self-pay | Admitting: Vascular Surgery

## 2014-06-21 VITALS — BP 190/60 | HR 70 | Temp 97.8°F | Resp 14 | Ht 61.0 in | Wt 115.0 lb

## 2014-06-21 DIAGNOSIS — I6523 Occlusion and stenosis of bilateral carotid arteries: Secondary | ICD-10-CM

## 2014-06-21 DIAGNOSIS — M79672 Pain in left foot: Secondary | ICD-10-CM

## 2014-06-21 DIAGNOSIS — Z48812 Encounter for surgical aftercare following surgery on the circulatory system: Secondary | ICD-10-CM | POA: Diagnosis not present

## 2014-06-21 DIAGNOSIS — I739 Peripheral vascular disease, unspecified: Secondary | ICD-10-CM

## 2014-06-21 LAB — VAS US CAROTID
LCCADDIAS: 83 cm/s
LCCAPSYS: 76 cm/s
LEFT CCA MID DIAS: 10 R/I
LEFT CCA MID SYS: 44 cm/s
LEFT ECA DIAS: 0 cm/s
LEFT ICA/CCA RATIO: 7.5 cm/s
LICAMSYS: 171 cm/s
LICAPSYS: 330 cm/s
Left CCA dist sys: 503 cm/s
Left CCA prox dias: 14 cm/s
Left ECA sys: 168 cm/s
Left ICA dist dias: 20 cm/s
Left ICA dist sys: 91 cm/s
Left ICA mid dias: 29 cm/s
Left ICA prox dias: 49 cm/s
RCCADDIAS: 12 cm/s
RCCAPDIAS: 10 cm/s
RICADSYS: 99 cm/s
RICAMDIAS: 14 cm/s
RICAMSYS: 78 cm/s
RIGHT CCA MID DIAS: 14 cm/s
RIGHT CCA MID SYS: 111 cm/s
RIGHT ECA DIAS: 0 cm/s
Right CCA prox sys: 104 cm/s
Right ICA dist dias: 18 cm/s
Right ICA prox dias: 9 cm/s
Right ICA prox sys: 48 cm/s
Right cca dist sys: 83 cm/s
Right eca sys: 132 cm/s

## 2014-06-21 NOTE — Progress Notes (Signed)
      History of Present Illness  Regina Hamilton is a 81 y.o. (1923-11-30) female who presents with chief complaint: carotid stenosis and peripheral vascular disease s/p right below-knee amputation 05/25/13.  In regards to her peripheral vascular disease, she was last seen in the office by Clemon Chambers, NP on 12/21/13. Her right BKA stump at the time was slowly healing and she was receiving weekly wound care in [place]. Her wound has now healed and she is ambulating with a prosthesis. She walks about a mile a day per her daughter. She denies any problems with her left leg. She denies any claudication, rest pain or non healing wounds.   Regarding her carotid disease, she denies any stroke or TIA symptoms. She denies amaurosis fugax, facial drooping, hemiplegia, and receptive or expressive aphasia.   She is unable to tolerate a statin and plavix.   The patient's PMH, PSH, SH, FamHx, Med, and Allergies are unchanged from 12/21/13.  Physical Examination  Filed Vitals:   06/21/14 1242  BP: 190/60  Pulse: 70  Temp: 97.8 F (36.6 C)  TempSrc: Oral  Resp: 14  Height: 5\' 1"  (1.549 m)  Weight: 115 lb (52.164 kg)  SpO2: 96%   Body mass index is 21.74 kg/(m^2).  General: A&O x 3, WDWN female in NAD  Neck: Supple  Pulmonary: Sym exp, good air movt, CTAB, no rales, rhonchi, & wheezing  Cardiac: RRR, Nl S1, S2, no Murmurs, rubs or gallops, left carotid bruit.   Vascular: 2+ radial pulses b/l, 2+ femoral pulses b/l, non palpable left pedal pulses  Musculoskeletal: M/S 5/5 throughout. Left superficial skin abrasion to lateral calf. Right BKA well healed.   Neurologic: CN 2-12 grossly intact. Pain and light touch intact in extremities. Motor exam as listed above  Non-Invasive Vascular Imaging  CAROTID DUPLEX (Date: 06/21/14):   R ICA stenosis: 1-49%  R VA:  patent and antegrade  L ICA stenosis: 50-69%  L VA:  patent and antegrade  ABIs  LLE: Non compressible vessels with  monophasic waveforms. TBI: 0.21.   RLE: BKA  Medical Decision Making  Regina Hamilton is a 81 y.o. female who presents with: asymptomatic left internal carotid stenosis 50-69% and right internal carotid stenosis 1-49%, peripheral vascular disease s/p right below-knee-amputation.   Carotid stenosis without mention of infarction She has remained asymptomatic. Her left ICA stenosis has slightly increased from her previous duplex in 11/16. She is unable to tolerate plavix and statin. Her daughter mentions using "cholestene" for hyperlipidemia with good success. Discussed that she may try this and to discontinue it if she is unable to tolerate it. Follow up in 6 months with carotid duplex studies.  Peripheral vascular disease Her left BKA wound has healed since previous visit and she is now ambulating well with a prosthesis. She had noncompressible vessels in her left leg with a TBI of 0.21. She denies any symptoms in her left leg. Will obtain ABIs in 6 months.   Regina Jock, PA-C [facility] [place] Office: [phone] Pager: [phone]  06/21/2014, 1:34 PM  This patient was seen in conjunction with Dr. Scot Dock.

## 2014-06-22 NOTE — Addendum Note (Signed)
Addended by: Mena Goes on: 06/22/2014 12:25 PM   Modules accepted: Orders

## 2014-07-17 IMAGING — CT CT FOOT*R* W/O CM
3 series · 15 of 27 positions shown, 18 images · non-contrast
Comparison: None.

CLINICAL DATA: Worsening swelling, pain, and redness of foot.

EXAM:
CT OF THE RIGHT FOOT WITHOUT CONTRAST
TECHNIQUE: Multidetector CT imaging was performed according to the standard
protocol. Multiplanar CT image reconstructions were also generated.

[Series 5: rt foot st · axial · 0.44mm/px · z∈[+58,+180]mm · 5 of 93 slices shown, 7 images]
[im 16/93  soft-tissue]
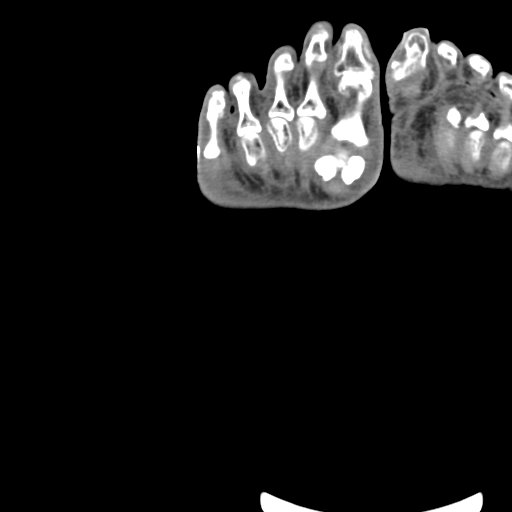
[im 16/93  bone]
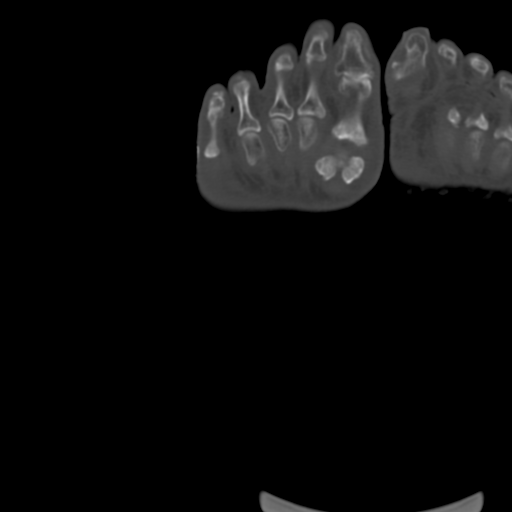
[im 31/93  bone]
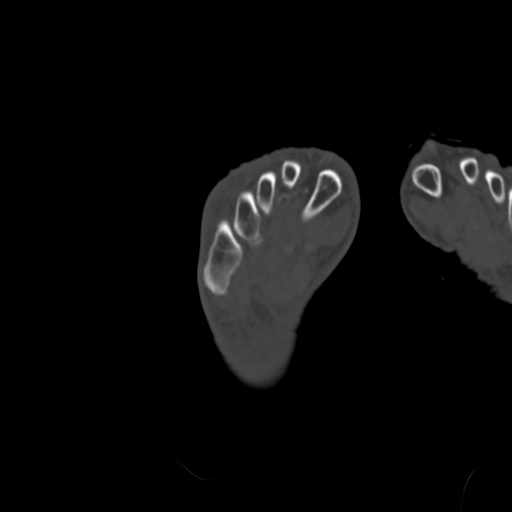
[im 47/93  bone]
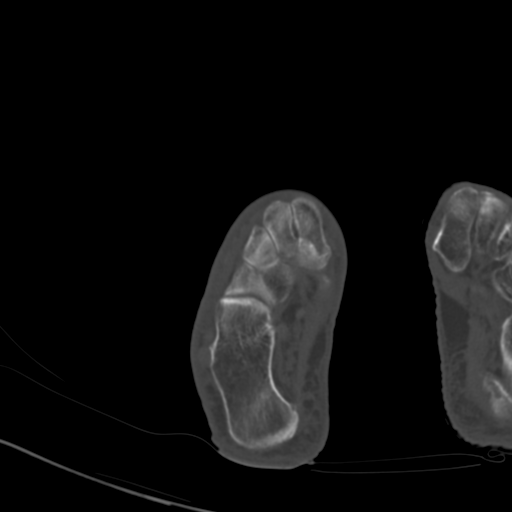
[im 62/93  bone]
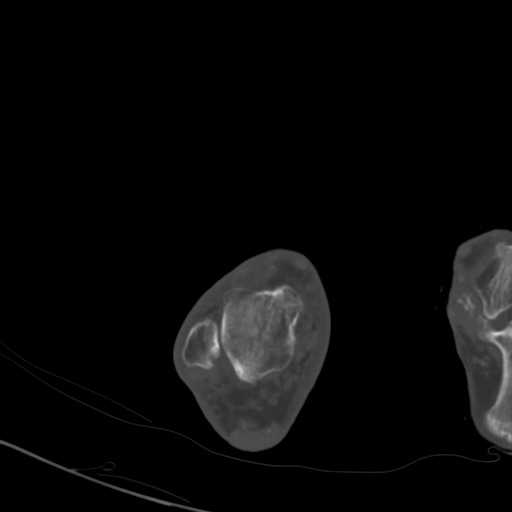
[im 77/93  soft-tissue]
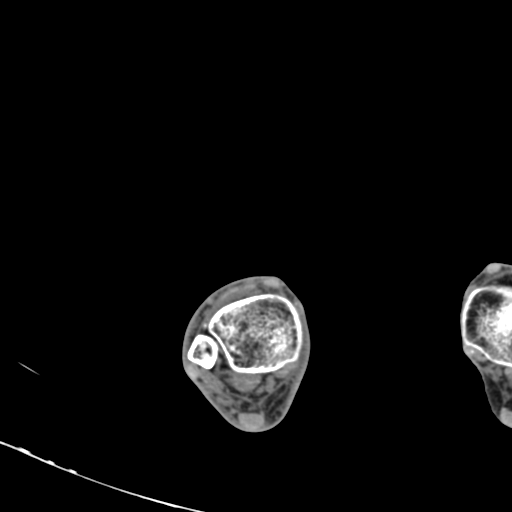
[im 77/93  bone]
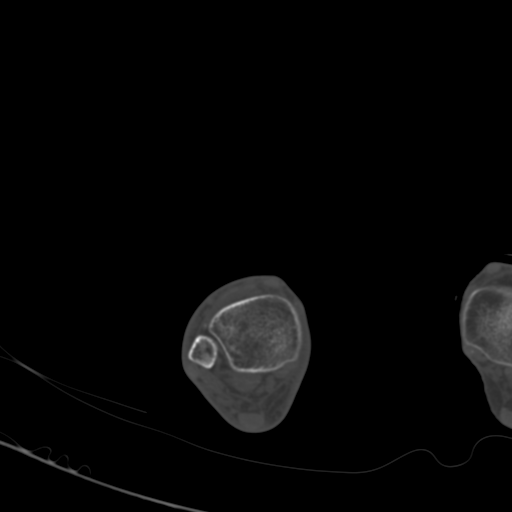

[Series 602: <mpr thick range> · sagittal · 0.44mm/px · 5 of 50 slices shown, 6 images]
[im 17/50  bone]
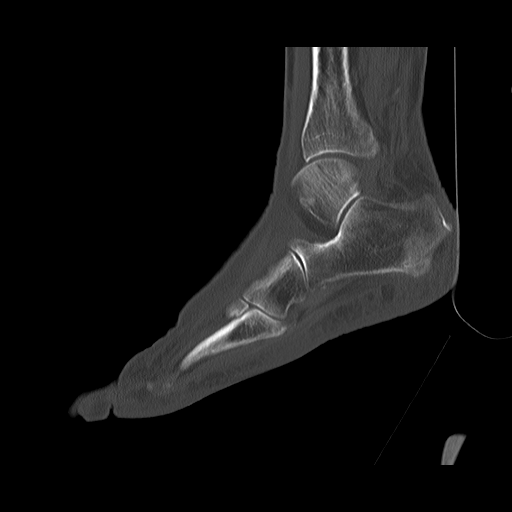
[im 21/50  bone]
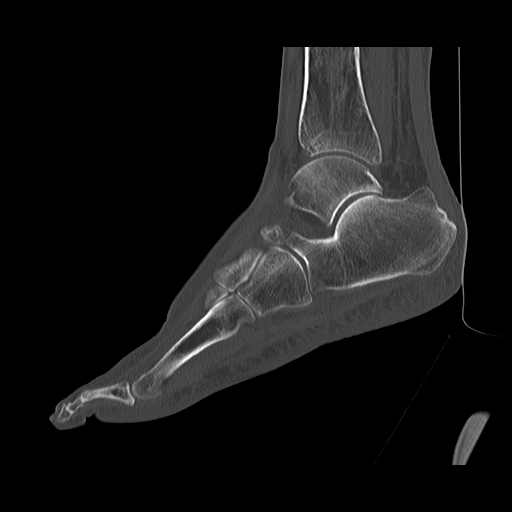
[im 25/50  soft-tissue]
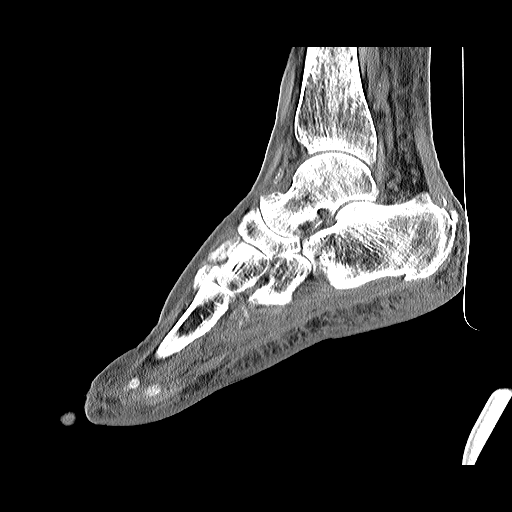
[im 25/50  bone]
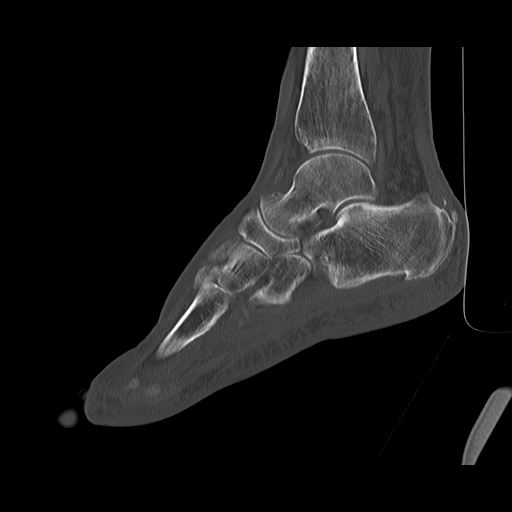
[im 29/50  bone]
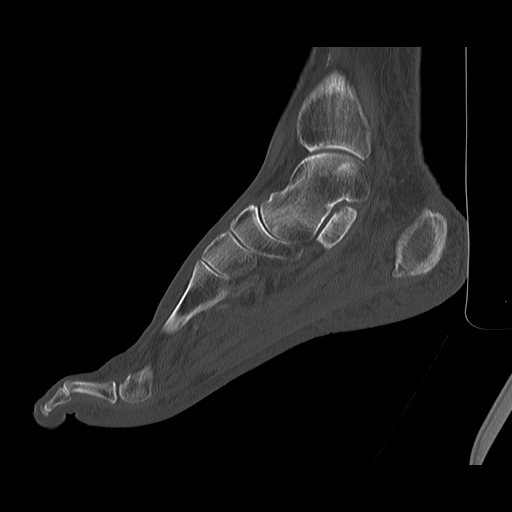
[im 33/50  bone]
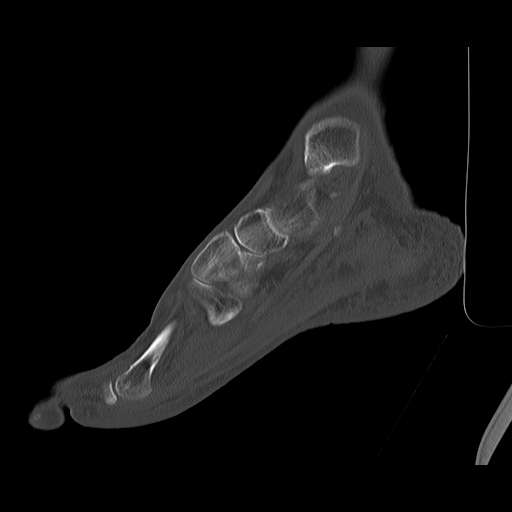

[Series 604: <mpr thick range(2)> · axial · 0.44mm/px · z∈[+30,+124]mm · 5 of 82 slices shown]
[im 14/82  bone]
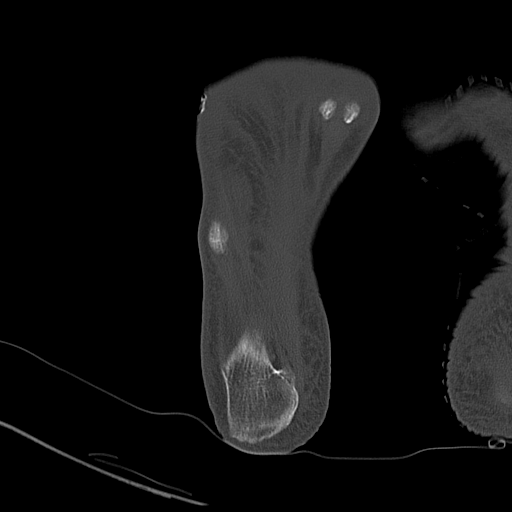
[im 28/82  bone]
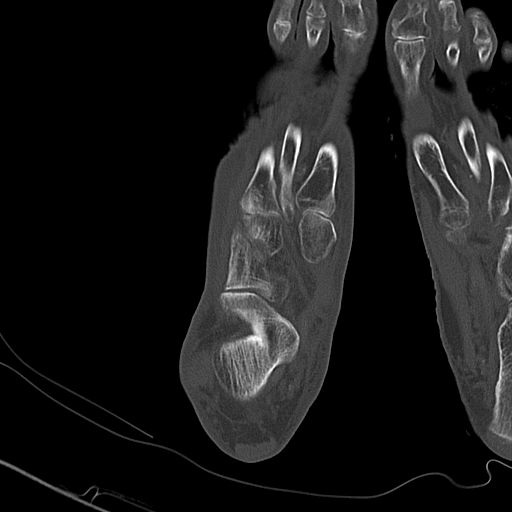
[im 41/82  bone]
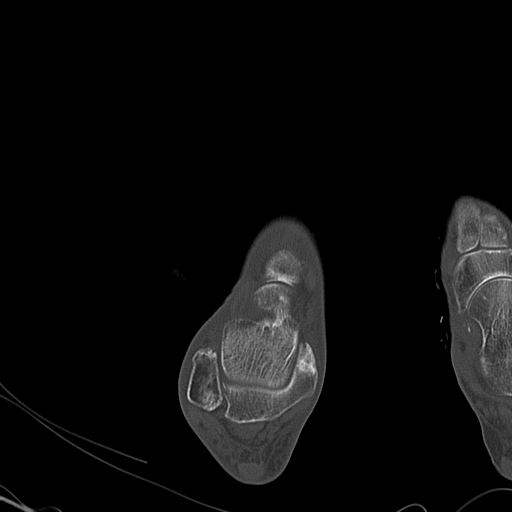
[im 55/82  bone]
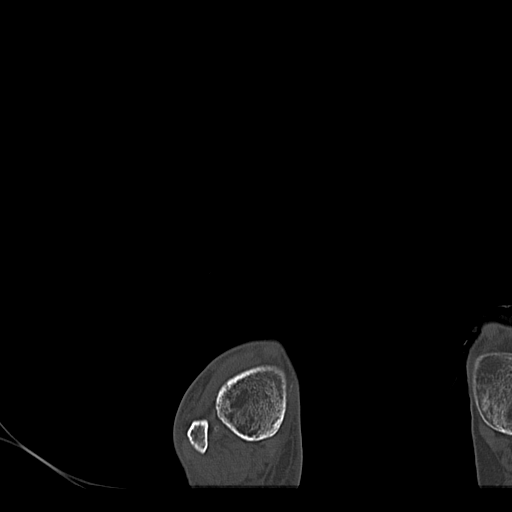
[im 68/82  bone]
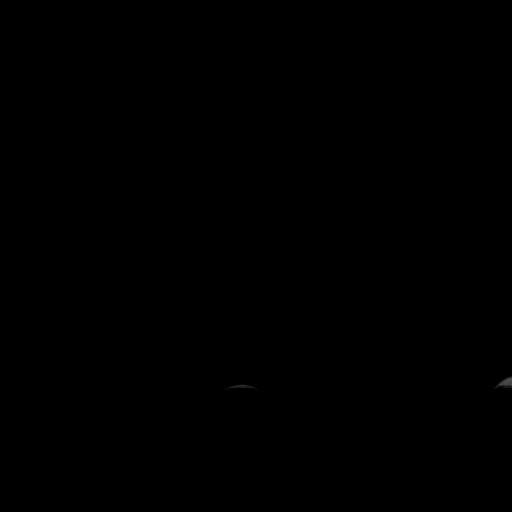

[15 of 27 positions shown; findings below may reference images not displayed]

FINDINGS: There is soft tissue swelling along the lateral aspect of the right
foot at the level of the fifth metatarsal head. The soft tissue
swelling extends to the level of the cortex. There is no cortical
destruction. There is no bone resort shin or periosteal reaction.
There is no subcutaneous emphysema. The joint spaces are relatively
well maintained. There is no acute fracture or dislocation. There is
no focal fluid collection. There is generalized edema involving the
right foot.

The extensor, flexor and peroneal tendons are grossly intact. The
Achilles tendon is intact.
IMPRESSION: Soft tissue swelling along the lateral aspect of the right foot at
the level of the fifth metatarsal head without adjacent bone
destruction or periosteal reaction. There are no CT findings to
suggest osteomyelitis. If there is further clinical concern
recommend MRI without and with intravenous contrast.

## 2014-07-17 IMAGING — CR DG FOOT COMPLETE 3+V*R*
3 series · 3 of 3 positions shown · non-contrast
Comparison: None

CLINICAL DATA: Right foot pain, redness, multiple ulcers

EXAM:
RIGHT FOOT COMPLETE - 3+ VIEW

[x foot ap right]
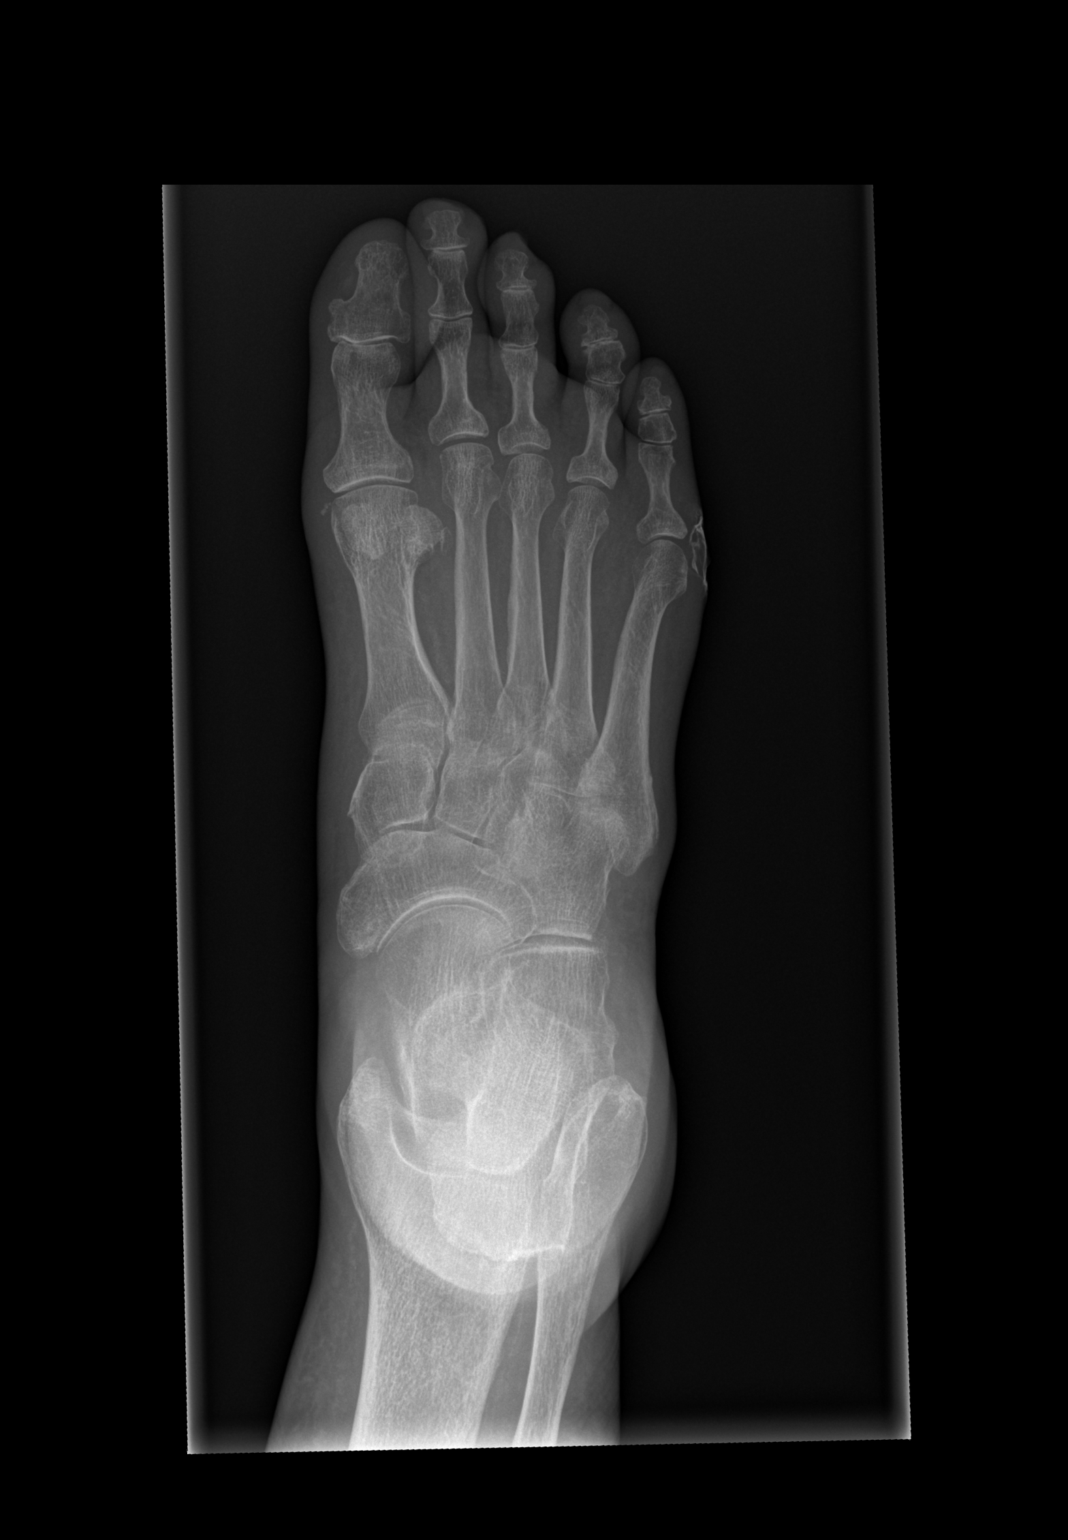

[x foot obl right]
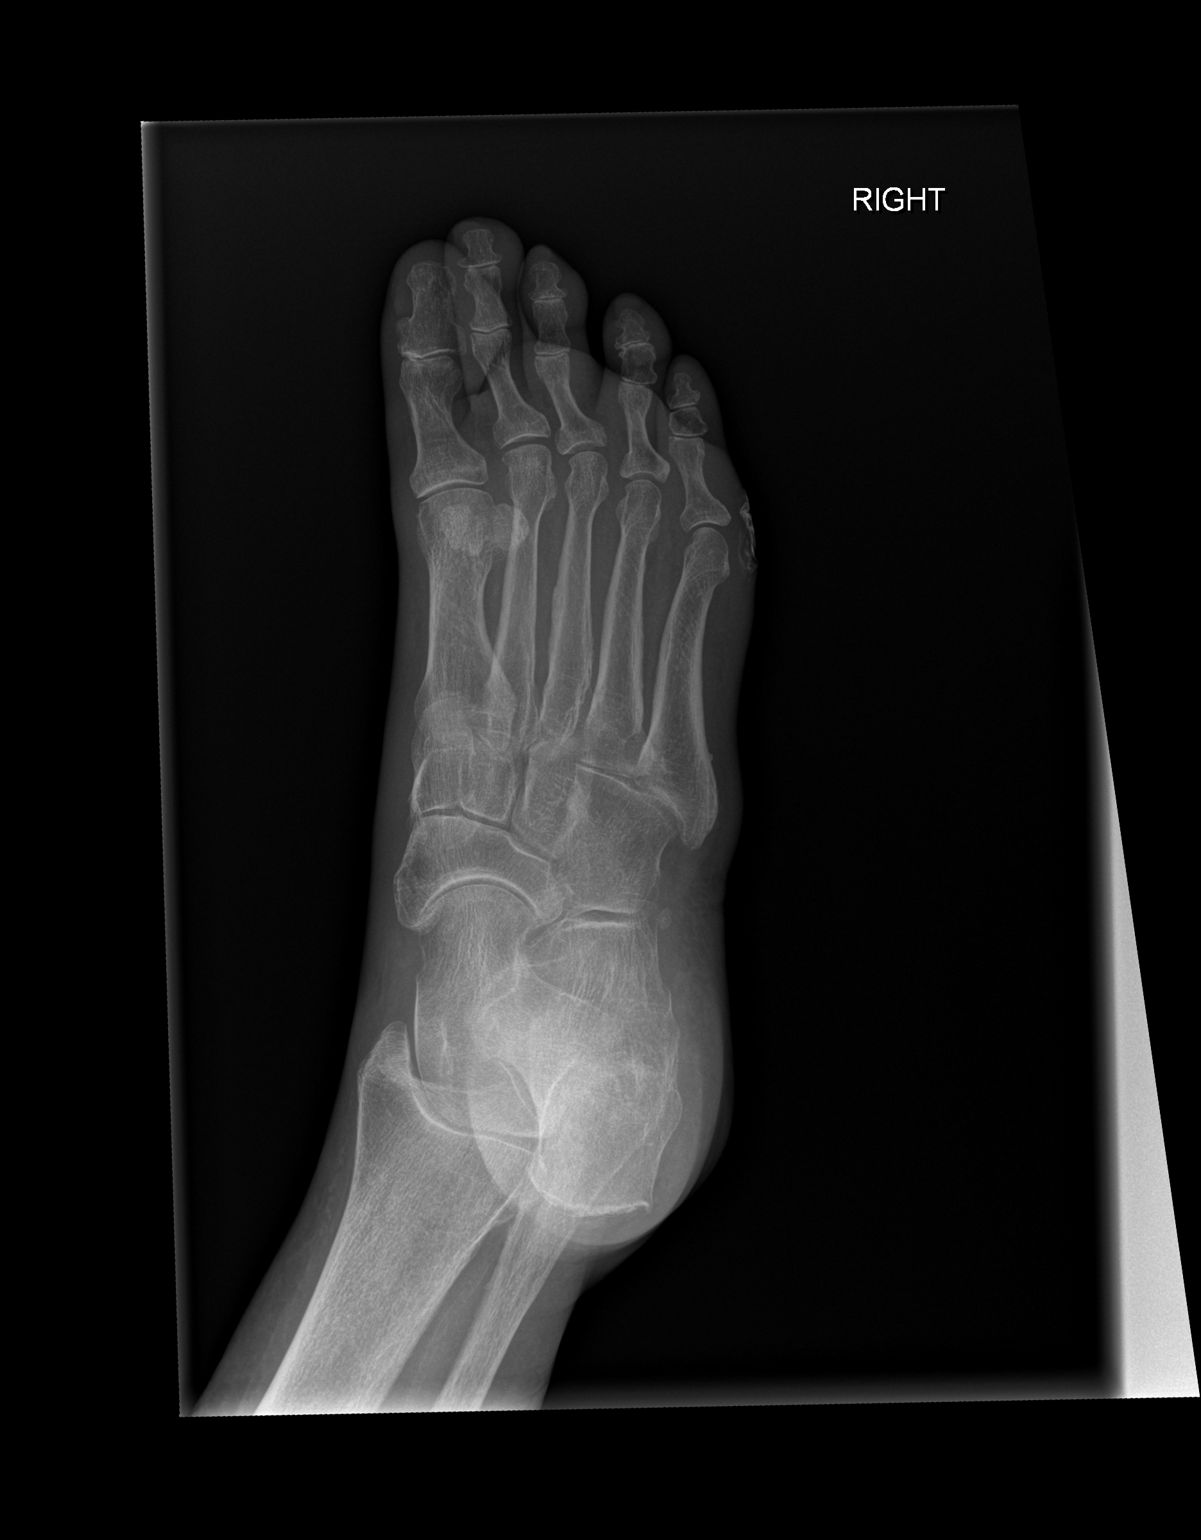

[x foot lat right]
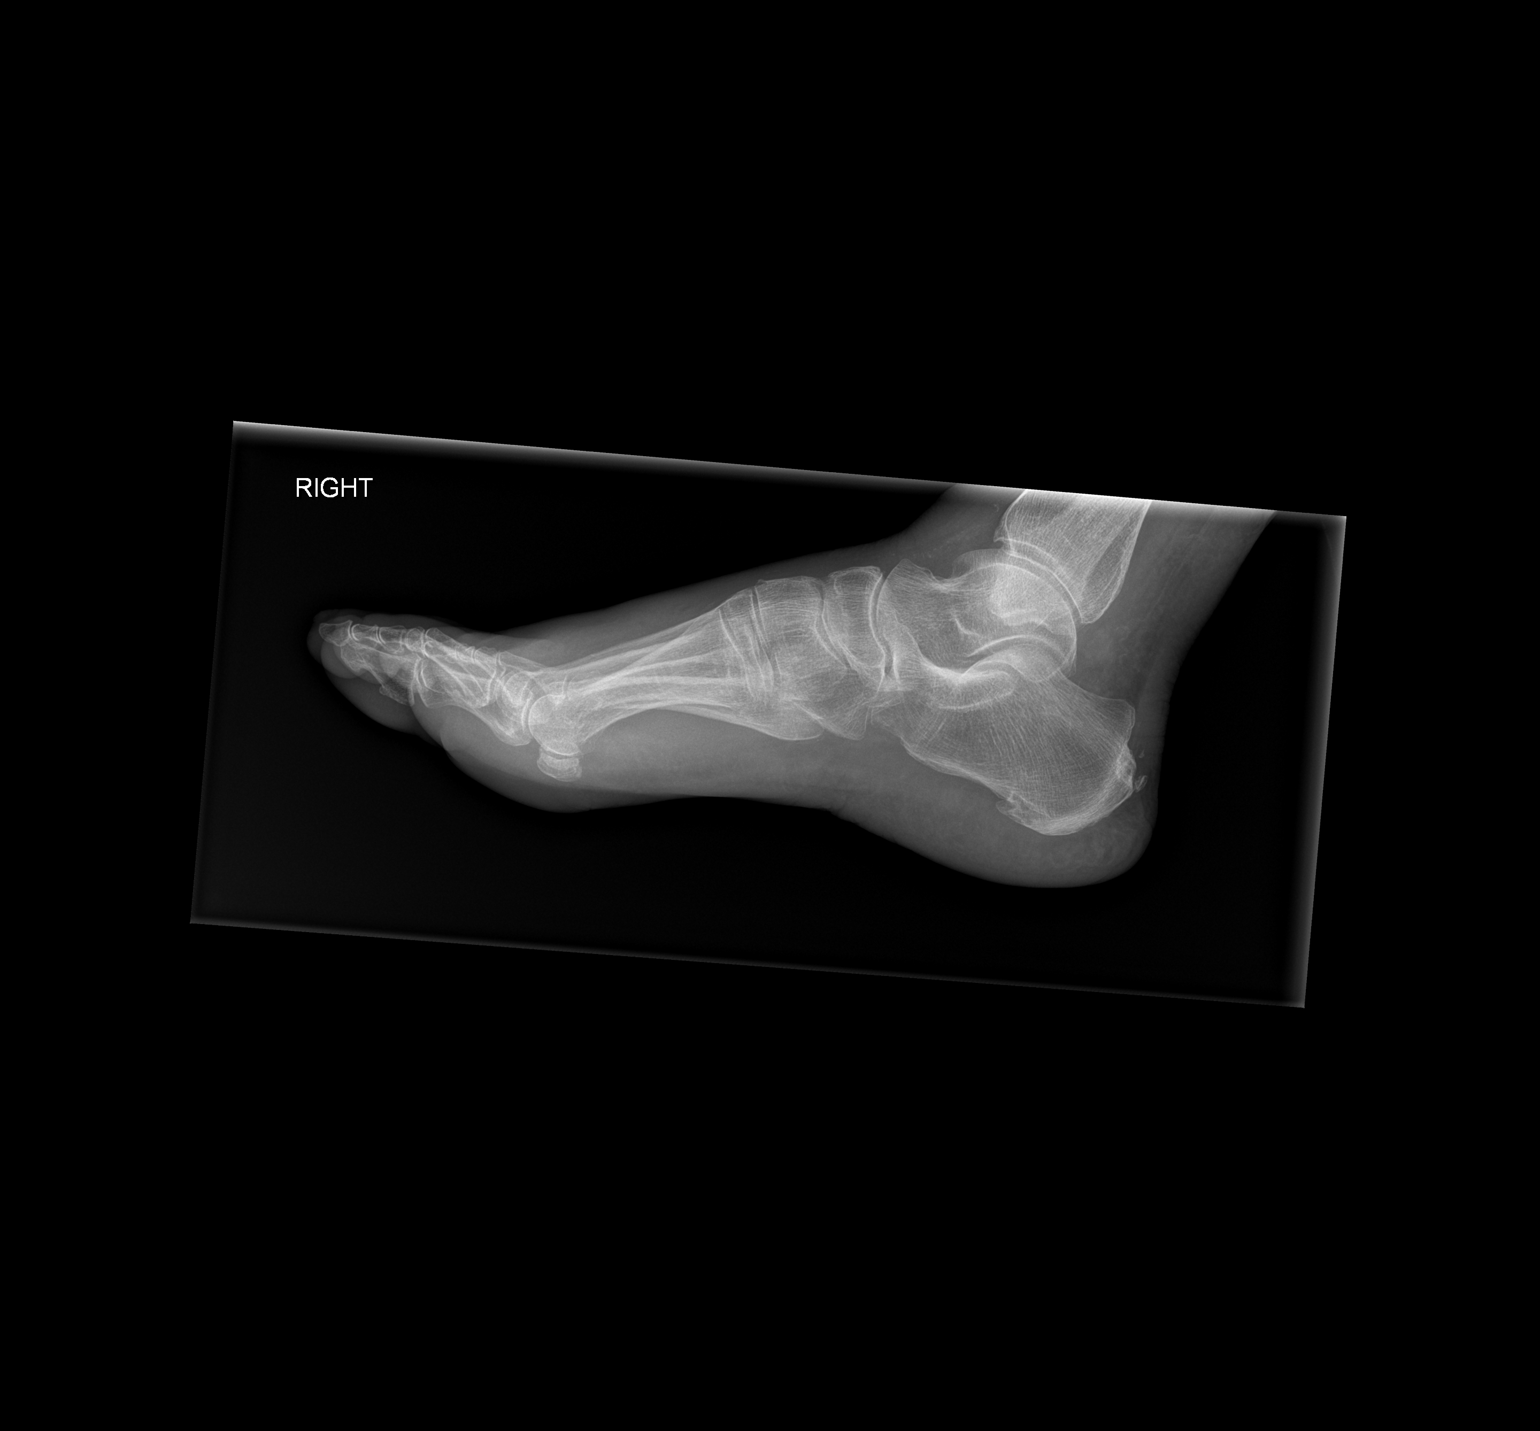

[3 of 3 positions shown; findings below may reference images not displayed]

FINDINGS: Diffuse osseous demineralization.

Minimal scattered narrowing of interphalangeal joints and first MTP
joint.

Dressing artifacts lateral to the fifth MTP joint.

No acute fracture, dislocation or bone destruction.

Minimal calcaneal spurring and diffuse right foot soft tissue
swelling.
IMPRESSION: No acute osseous abnormalities.

## 2014-08-30 ENCOUNTER — Ambulatory Visit: Payer: Medicare Other | Admitting: Internal Medicine

## 2014-09-06 ENCOUNTER — Ambulatory Visit: Payer: Medicare Other | Admitting: Internal Medicine

## 2014-09-25 ENCOUNTER — Ambulatory Visit: Payer: Medicare Other | Admitting: Internal Medicine

## 2014-12-12 ENCOUNTER — Ambulatory Visit (INDEPENDENT_AMBULATORY_CARE_PROVIDER_SITE_OTHER): Payer: Medicare Other | Admitting: Internal Medicine

## 2014-12-12 ENCOUNTER — Encounter: Payer: Self-pay | Admitting: Internal Medicine

## 2014-12-12 VITALS — BP 160/60 | HR 70 | Ht 60.0 in | Wt 117.4 lb

## 2014-12-12 DIAGNOSIS — I442 Atrioventricular block, complete: Secondary | ICD-10-CM | POA: Diagnosis not present

## 2014-12-12 DIAGNOSIS — Z950 Presence of cardiac pacemaker: Secondary | ICD-10-CM | POA: Diagnosis not present

## 2014-12-12 DIAGNOSIS — I482 Chronic atrial fibrillation, unspecified: Secondary | ICD-10-CM

## 2014-12-12 DIAGNOSIS — I6523 Occlusion and stenosis of bilateral carotid arteries: Secondary | ICD-10-CM | POA: Diagnosis not present

## 2014-12-12 LAB — CUP PACEART INCLINIC DEVICE CHECK
Brady Statistic RV Percent Paced: 100 %
Date Time Interrogation Session: 20161025040000
Implantable Lead Location: 753859
Implantable Lead Serial Number: 532262
Lead Channel Impedance Value: 460 Ohm
Lead Channel Setting Pacing Amplitude: 2.4 V
Lead Channel Setting Sensing Sensitivity: 2.5 mV
MDC IDC LEAD IMPLANT DT: 20100727
MDC IDC LEAD IMPLANT DT: 20100727
MDC IDC LEAD LOCATION: 753860
MDC IDC LEAD SERIAL: 499788
MDC IDC MSMT LEADCHNL RV PACING THRESHOLD AMPLITUDE: 0.5 V
MDC IDC MSMT LEADCHNL RV PACING THRESHOLD PULSEWIDTH: 0.5 ms
MDC IDC SET LEADCHNL RV PACING PULSEWIDTH: 0.5 ms
MDC IDC STAT BRADY RA PERCENT PACED: 0 %
Pulse Gen Serial Number: 137331

## 2014-12-12 MED ORDER — APIXABAN 2.5 MG PO TABS: 2.5000 mg | ORAL_TABLET | Freq: Two times a day (BID) | ORAL | Status: DC

## 2014-12-12 NOTE — Progress Notes (Signed)
Patient Care Team: Cassandria Anger, MD as PCP - General (Internal Medicine) Paralee Cancel, MD (Orthopedic Surgery) Deboraha Sprang, MD (Cardiology)   HPI  Regina Hamilton is a 81 y.o. female Seen in followup for atrial fibrillation treated with AV junction ablation and pacemaker implantation 2010.   She was hospitalized July 2014 for acute on chronic HFpEF  Attempts to use anticoagulation with Coumadin failed because of a rash. She has been treated with aspirin.  She has peripheral vascular disease and underwent angioplasty of her lower extremities by Dr. Doren Custard with improvement in her ambulation. She then underwent amputation with need for revision  She ended up on Plavix because of problems with bleeding her aspirin was discontinued.  Unfortunately, she had a stroke  about a month or so ago. There is some residual aphasia and she is currently in rehabilitation.  Echocardiogram 7/14 demonstrated normal left ventricular function  The patient denies chest pain, shortness of breath, nocturnal dyspnea, orthopnea or peripheral edema.  T   Past Medical History  Diagnosis Date  . Peripheral vascular disease ([place])   . Carotid artery occlusion   . Hypertension   . Hyperlipidemia   . Pacemaker 2008    [facility] in [place], [place]  . Dysrhythmia     atrial fibrilation  . Anxiety   . Chronic venous insufficiency     B  . Atrioventricular block, complete s/p AV junction ablation 11/19/2012  . Neuromuscular disorder ([place])     periphreal neuroptahty  . Ischemia of right BKA site ([place]) 05/26/2013  . Kidney stones     "never had OR"  . Complication of anesthesia     "NO VERSED!!!!!!!!!!!!!!!;  becomes extremely combative; post anesthesia psychosis" (08/02/2013)  . PAD (peripheral artery disease) ([employer])   . Arthritis     "HANDS" (08/02/2013)  . Spinal stenosis of lumbar region at multiple levels   . Scoliosis   . Chronic back pain     "all over back" (08/02/2013)  .  Depression   . Basal cell carcinoma     Past Surgical History  Procedure Laterality Date  . Anterior cervical decomp/discectomy fusion  1970  . Atrial ablation surgery  2010  . Pr vein bypass graft,aorto-fem-pop    . Total hip arthroplasty Right     S/P fracture  . Cholecystectomy  05/20/11  . Angioplasty  03/01/2012  . Insert / replace / remove pacemaker  2010  . Corneal transplant Bilateral   . Back surgery    . Abdominal aortagram  Feb. 16, 2015  . Amputation Right 05/25/2013    Procedure: AMPUTATION BELOW KNEE;  Surgeon: Rosetta Posner, MD;  Location: [facility] OR;  Service: Vascular;  Laterality: Right;  . Hemiarthroplasty hip Left     S/P fracture  . Revision total hip arthroplasty Left   . Cataract extraction w/ intraocular lens  implant, bilateral Bilateral   . Knee arthroscopy Bilateral   . Fixation kyphoplasty thoracic spine  ~ 09/2010  . Basal cell carcinoma excision      "several removed off face, hands, arms"  . I&d extremity Right 08/03/2013    Procedure: IRRIGATION AND DEBRIDEMENT EXTREMITY OF RIGHT BELOW THE KNEE AMPUTATION SITE;  Surgeon: Rosetta Posner, MD;  Location: [place];  Service: Vascular;  Laterality: Right;  . Application of wound vac Right 08/03/2013    Procedure: APPLICATION OF WOUND VAC TO RIGHT LEG STUMP;  Surgeon: Rosetta Posner, MD;  Location: [place];  Service: Vascular;  Laterality: Right;  . Lower extremity angiogram Right 09/22/2011    Procedure: LOWER EXTREMITY ANGIOGRAM;  Surgeon: Angelia Mould, MD;  Location: [facility] CATH LAB;  Service: Cardiovascular;  Laterality: Right;  . Abdominal aortagram N/A 04/04/2013    Procedure: ABDOMINAL Maxcine Ham;  Surgeon: Angelia Mould, MD;  Location: [facility] CATH LAB;  Service: Cardiovascular;  Laterality: N/A;  . Lower extremity angiogram Bilateral 04/04/2013    Procedure: LOWER EXTREMITY ANGIOGRAM;  Surgeon: Angelia Mould, MD;  Location: [facility] CATH LAB;  Service: Cardiovascular;  Laterality: Bilateral;    Current  Outpatient Prescriptions  Medication Sig Dispense Refill  . acetaminophen (TYLENOL) 500 MG chewable tablet Chew 1,000 mg by mouth as needed for pain.     Marland Hamilton ALPRAZolam (XANAX) 0.25 MG tablet Take 1 tablet (0.25 mg total) by mouth at bedtime. 30 tablet 5  . amLODipine (NORVASC) 10 MG tablet Take 1 tablet (10 mg total) by mouth daily. 30 tablet 11  . Cholecalciferol 5000 UNITS TABS Take by mouth daily.    . clopidogrel (PLAVIX) 75 MG tablet Take 1 tablet (75 mg total) by mouth daily. 30 tablet 11  . cyclobenzaprine (FLEXERIL) 5 MG tablet Take 5 mg by mouth as needed for muscle spasms.     Marland Hamilton docusate sodium (COLACE) 100 MG capsule Take 200 mg by mouth daily.     . dorzolamide (TRUSOPT) 2 % ophthalmic solution Place 1 drop into the left eye 2 (two) times daily. 10 mL 11  . erythromycin ophthalmic ointment 1 application as needed. Apply to left lid for 14 days    . ferrous sulfate 325 (65 FE) MG tablet Take 325 mg by mouth daily with breakfast.    . gabapentin (NEURONTIN) 100 MG capsule Take 1 capsule (100 mg total) by mouth 3 (three) times daily. 90 capsule 11  . lisinopril (PRINIVIL,ZESTRIL) 20 MG tablet Take 1 tablet (20 mg total) by mouth 2 (two) times daily. 60 tablet 11  . loteprednol (LOTEMAX) 0.5 % ophthalmic suspension Place 1 drop into both eyes daily.    . metoprolol succinate (TOPROL-XL) 50 MG 24 hr tablet Take 50 mg by mouth daily. Take with or immediately following a meal.    . Misc. Devices [facility]) MISC Lightweight Dx: OA, leg amputated 1 each 0  . Multiple Vitamin (MULTIVITAMIN WITH MINERALS) TABS tablet Take 1 tablet by mouth daily at 12 noon.     . polyethylene glycol (MIRALAX / GLYCOLAX) packet Take 17 g by mouth daily as needed for mild constipation.    . potassium chloride (K-DUR) 10 MEQ tablet Take 10 mEq by mouth daily.    Marland Hamilton pyridOXINE (B-6) 100 MG tablet Take 1 tablet (100 mg total) by mouth daily. 30 tablet 0  . vitamin C (ASCORBIC ACID) 500 MG tablet Take 500 mg by  mouth daily at 12 noon.     . zinc sulfate 220 MG capsule Take 220 mg by mouth daily.     No current facility-administered medications for this visit.    Allergies  Allergen Reactions  . Avelox [Moxifloxacin Hcl In Nacl] Other (See Comments)    tongue swelling  . Clonidine Derivatives Other (See Comments)    Pt's daughter states she has severe hallucinations  . Latanoprost Anaphylaxis    redness  . Moxifloxacin Other (See Comments)     Tongue swelling; mouth ulcers; confusion  . Pentoxifylline     Severe SOB, mental confusion per daughter  . Versed [Midazolam] Other (See Comments)  hallucinations per daughter  . Warfarin Sodium Rash  . Alphagan P [Brimonidine Tartrate] Photosensitivity    Redness bilateral eye  . Contrast Media [Iodinated Diagnostic Agents] Rash    Contrast dye.  . Dilaudid [Hydromorphone Hcl] Nausea And Vomiting    IV formulation only  . Diltiazem Hcl Hives  . Doxycycline Nausea And Vomiting  . Hydralazine Itching  . Iron Rash  . Meloxicam Other (See Comments)    Odd feeling    Review of Systems negative except from HPI and PMH  Physical Exam BP 160/60 mmHg  Pulse 70  Ht 5' (1.524 m)  Wt 117 lb 6.4 oz (53.252 kg)  BMI 22.93 kg/m2 Well developed and nourished in no acute distress sitting in wheelchair HENT normal Neck supple with JVP-flat Clear Device pocket well healed; without hematoma or erythema.  There is no tethering Regular rate and rhythm, 2/ 6 systolic murmur Abd-soft with active BS No Clubbing cyanosis edema   Amputation LBKA Skin-warm and dry; chronic venous insufficiency changes A  Grossly normal sensory and motor function    ECG demonstrates atrial fibrillation with ventricular pacing  Assessment and Plan  Atrial fibrillation-permanent  Complete heart block s/p AV junction ablation  Pacemaker-[employer]  Hypertension   Stroke  Peripheral vascular disease   Her blood pressure remains elevated. Right now will  defer management of that to the primary care physician is I don't know what the outpatient records (nursing home.  I reached out to Dr. Doren Custard to make sure that he has no absolute contraindications to discontinue her Plavix and initiating anticoagulant therapy. Given her stroke in the context of her atrial fibrillation this transition medications is appropriate. We have discussed the issues of bleeding. We will use apixaban 2.5 mg twice daily.  Last creatinine 3/16 was 0.66.

## 2014-12-12 NOTE — Patient Instructions (Addendum)
Medication Instructions: - If Dr. Scot Dock is in agreement, we will 1) Stop plavix 2) Start Eliquis 2.5 mg one tablet by mouth twice daily  ** Please do not make this change until we call you  Labwork: - none  Procedures/Testing: - none  Follow-Up: - Your physician wants you to follow-up in: 6 months with the [facility] 1 year with Dr. Caryl Comes. You will receive a reminder letter in the mail two months in advance. If you don't receive a letter, please call our office to schedule the follow-up appointment.  Any Additional Special Instructions Will Be Listed Below (If Applicable). - none

## 2014-12-22 ENCOUNTER — Telehealth: Payer: Self-pay | Admitting: Internal Medicine

## 2014-12-22 NOTE — Telephone Encounter (Signed)
Error

## 2014-12-22 NOTE — Telephone Encounter (Signed)
I attempted to call the patient's facility where she resides currently. Per. Dr. Caryl Comes, Dr. Scot Dock was in agreement with discontinuing the patient's plavix and starting Eliquis 2.5 mg BID. I attempted to call the facility twice and the line is ringing busy. I will call back today.

## 2014-12-22 NOTE — Telephone Encounter (Signed)
I spoke with Nira Conn, RN at the patient's nursing facility. She reports the patient has already been switched to Eliquis.

## 2014-12-25 ENCOUNTER — Encounter: Payer: Self-pay | Admitting: Vascular Surgery

## 2014-12-26 ENCOUNTER — Encounter: Payer: Self-pay | Admitting: Internal Medicine

## 2014-12-27 ENCOUNTER — Ambulatory Visit (INDEPENDENT_AMBULATORY_CARE_PROVIDER_SITE_OTHER)
Admission: RE | Admit: 2014-12-27 | Discharge: 2014-12-27 | Disposition: A | Discharge status: Home / Self Care | Payer: No Typology Code available for payment source | Source: Ambulatory Visit | Attending: Vascular Surgery | Admitting: Vascular Surgery

## 2014-12-27 ENCOUNTER — Ambulatory Visit (HOSPITAL_COMMUNITY)
Admission: RE | Admit: 2014-12-27 | Discharge: 2014-12-27 | Disposition: A | Discharge status: Home / Self Care | Payer: No Typology Code available for payment source | Source: Ambulatory Visit | Attending: Vascular Surgery | Admitting: Vascular Surgery

## 2014-12-27 ENCOUNTER — Encounter: Payer: Self-pay | Admitting: Vascular Surgery

## 2014-12-27 ENCOUNTER — Ambulatory Visit (INDEPENDENT_AMBULATORY_CARE_PROVIDER_SITE_OTHER): Payer: Medicare Other | Admitting: Vascular Surgery

## 2014-12-27 VITALS — BP 182/70 | HR 68 | Temp 97.9°F | Resp 18 | Ht 61.0 in | Wt 115.0 lb

## 2014-12-27 DIAGNOSIS — I6523 Occlusion and stenosis of bilateral carotid arteries: Secondary | ICD-10-CM

## 2014-12-27 DIAGNOSIS — I739 Peripheral vascular disease, unspecified: Secondary | ICD-10-CM | POA: Diagnosis not present

## 2014-12-27 DIAGNOSIS — Z48812 Encounter for surgical aftercare following surgery on the circulatory system: Secondary | ICD-10-CM | POA: Diagnosis not present

## 2014-12-27 NOTE — Progress Notes (Signed)
[facility] [place]  Patient name: Regina Hamilton MRN: [id number] DOB: 1923-07-26 Sex: female  REASON FOR VISIT: Follow up  HPI: Regina Hamilton is a 81 y.o. female was status post a right below the knee amputation. In addition I been following her with carotid disease. She is ambulatory with her prosthesis. She had a stroke associated with expressive aphasia earlier this fall. She is followed by Dr. Caryl Comes and has a history of atrial fibrillation treated with AV junction ablation and pacemaker implantation in 2010. She had been on Coumadin but this was discontinued because of a rash and she is now on Eliquis.  She is ambulatory with her prosthesis. She denies any significant pain in the left foot and has no history of wounds on the left foot.  She had expressive aphasia with her stroke earlier this fall in her speech has improved significantly. She had no focal weakness or paresthesias.  Past Medical History  Diagnosis Date  . Peripheral vascular disease ([place])   . Carotid artery occlusion   . Hypertension   . Hyperlipidemia   . Pacemaker 2008    [facility] in [place], [place]  . Dysrhythmia     atrial fibrilation  . Anxiety   . Chronic venous insufficiency     B  . Atrioventricular block, complete s/p AV junction ablation 11/19/2012  . Neuromuscular disorder ([place])     periphreal neuroptahty  . Ischemia of right BKA site ([place]) 05/26/2013  . Kidney stones     "never had OR"  . Complication of anesthesia     "NO VERSED!!!!!!!!!!!!!!!;  becomes extremely combative; post anesthesia psychosis" (08/02/2013)  . PAD (peripheral artery disease) ([place])   . Arthritis     "HANDS" (08/02/2013)  . Spinal stenosis of lumbar region at multiple levels   . Scoliosis   . Chronic back pain     "all over back" (08/02/2013)  . Depression   . Basal cell carcinoma     Family History  Problem Relation Age of Onset  . Heart disease Mother     Heart disease before age 64   . Hypertension Mother   . Varicose Veins Mother   . Peripheral vascular disease Mother   . Other Mother     amputation  . Heart disease Father     Heart Diease before age 75  . Heart disease Maternal Grandmother   . Heart disease Paternal Grandmother   . Diabetes Sister   . Hypertension Sister   . Varicose Veins Sister   . Diabetes Brother   . Hypertension Brother   . Heart attack Brother   . Hypertension Daughter     SOCIAL HISTORY: Social History  Substance Use Topics  . Smoking status: Never Smoker   . Smokeless tobacco: Never Used  . Alcohol Use: No    Allergies  Allergen Reactions  . Avelox [Moxifloxacin Hcl In Nacl] Other (See Comments)    tongue swelling  . Clonidine Derivatives Other (See Comments)    Pt's daughter states she has severe hallucinations  . Latanoprost Anaphylaxis    redness  . Moxifloxacin Other (See Comments)     Tongue swelling; mouth ulcers; confusion  . Pentoxifylline     Severe SOB, mental confusion per daughter  . Versed [Midazolam] Other (See Comments)     hallucinations per daughter  . Warfarin Sodium Rash  . Alphagan P [Brimonidine Tartrate] Photosensitivity    Redness bilateral eye  . Contrast Media [Iodinated  Diagnostic Agents] Rash    Contrast dye.  . Dilaudid [Hydromorphone Hcl] Nausea And Vomiting    IV formulation only  . Diltiazem Hcl Hives  . Doxycycline Nausea And Vomiting  . Hydralazine Itching  . Iron Rash  . Meloxicam Other (See Comments)    Odd feeling    Current Outpatient Prescriptions  Medication Sig Dispense Refill  . acetaminophen (TYLENOL) 500 MG chewable tablet Chew 1,000 mg by mouth as needed for pain.     Marland Kitchen ALPRAZolam (XANAX) 0.25 MG tablet Take 1 tablet (0.25 mg total) by mouth at bedtime. 30 tablet 5  . amLODipine (NORVASC) 10 MG tablet Take 1 tablet (10 mg total) by mouth daily. 30 tablet 11  . apixaban (ELIQUIS) 2.5 MG TABS tablet Take 1 tablet (2.5 mg total) by mouth 2 (two) times daily. 60 tablet  6  . Cholecalciferol 5000 UNITS TABS Take by mouth daily.    . cyclobenzaprine (FLEXERIL) 5 MG tablet Take 5 mg by mouth as needed for muscle spasms.     Marland Kitchen docusate sodium (COLACE) 100 MG capsule Take 200 mg by mouth daily.     . dorzolamide (TRUSOPT) 2 % ophthalmic solution Place 1 drop into the left eye 2 (two) times daily. 10 mL 11  . erythromycin ophthalmic ointment 1 application as needed. Apply to left lid for 14 days    . ferrous sulfate 325 (65 FE) MG tablet Take 325 mg by mouth daily with breakfast.    . gabapentin (NEURONTIN) 100 MG capsule Take 1 capsule (100 mg total) by mouth 3 (three) times daily. 90 capsule 11  . lisinopril (PRINIVIL,ZESTRIL) 20 MG tablet Take 1 tablet (20 mg total) by mouth 2 (two) times daily. 60 tablet 11  . loteprednol (LOTEMAX) 0.5 % ophthalmic suspension Place 1 drop into both eyes daily.    . metoprolol succinate (TOPROL-XL) 50 MG 24 hr tablet Take 50 mg by mouth daily. Take with or immediately following a meal.    . Misc. Devices [facility]) MISC Lightweight Dx: OA, leg amputated 1 each 0  . Multiple Vitamin (MULTIVITAMIN WITH MINERALS) TABS tablet Take 1 tablet by mouth daily at 12 noon.     . polyethylene glycol (MIRALAX / GLYCOLAX) packet Take 17 g by mouth daily as needed for mild constipation.    . potassium chloride (K-DUR) 10 MEQ tablet Take 10 mEq by mouth daily.    Marland Kitchen pyridOXINE (B-6) 100 MG tablet Take 1 tablet (100 mg total) by mouth daily. 30 tablet 0  . vitamin C (ASCORBIC ACID) 500 MG tablet Take 500 mg by mouth daily at 12 noon.     . zinc sulfate 220 MG capsule Take 220 mg by mouth daily.     No current facility-administered medications for this visit.    REVIEW OF SYSTEMS:  [X]  denotes positive finding, [ ]  denotes negative finding Cardiac  Comments:  Chest pain or chest pressure:    Shortness of breath upon exertion:    Short of breath when lying flat:    Irregular heart rhythm: X History of atrial fibrillation.      Vascular      Pain in calf, thigh, or hip brought on by ambulation:    Pain in feet at night that wakes you up from your sleep:     Blood clot in your veins:    Leg swelling:         Pulmonary    Oxygen at home:    Productive cough:  Wheezing:         Neurologic    Sudden weakness in arms or legs:     Sudden numbness in arms or legs:     Sudden onset of difficulty speaking or slurred speech: X As documented above.  Temporary loss of vision in one eye:     Problems with dizziness:         Gastrointestinal    Blood in stool:     Vomited blood:         Genitourinary    Burning when urinating:     Blood in urine:        Psychiatric    Major depression:         Hematologic    Bleeding problems:    Problems with blood clotting too easily:        Skin    Rashes or ulcers:        Constitutional    Fever or chills:      PHYSICAL EXAM: Filed Vitals:   12/27/14 1306 12/27/14 1310  BP: 188/68 182/70  Pulse: 70 68  Temp: 97.9 F (36.6 C)   TempSrc: Oral   Resp: 18   Height: 5\' 1"  (1.549 m)   Weight: 115 lb (52.164 kg)   SpO2: 97%     GENERAL: The patient is a well-nourished female, in no acute distress. The vital signs are documented above. CARDIAC: There is a regular rate and rhythm.  VASCULAR: she has a left carotid bruit. She has a warm left foot with no pedal pulses palpable. PULMONARY: There is good air exchange bilaterally without wheezing or rales. ABDOMEN: Soft and non-tender with normal pitched bowel sounds.  MUSCULOSKELETAL: Her right BKA is healed. NEUROLOGIC: No focal weakness or paresthesias are detected. She has some mild expressive aphasia. SKIN: There are no ulcers or rashes noted on her left foot. PSYCHIATRIC: The patient has a normal affect.  DATA:  I have independently interpreted her carotid duplex scan today which shows a less than 40% right carotid stenosis. There is a 40-59% left carotid stenosis. This has not changed significantly since the study in  May 2016.  I have independently interpreted her arterial Doppler study today which shows monophasic Doppler signals in the left posterior tibial and dorsalis pedis position. Digital pressure on the left is 49 mmHg.  MEDICAL ISSUES:  MODERATE LEFT CAROTID STENOSIS: Despite her stroke earlier this fall with expressive aphasia she has only a moderate 40-59% left carotid stenosis. Given her age, I would recommend that we simply follow this closely and I ordered a follow up carotid duplex scan in 6 months. I'll see her back at that time. Her Plavix has been discontinued now that she is on Eliquis.   INFRAINGUINAL ARTERIAL OCCLUSIVE DISEASE: The patient has known infrainguinal arterial occlusive disease on the left but no ulcers. She is not a smoker. I will order follow up ABIs in 1 year.   Deitra Mayo [facility] [place]: [phone]

## 2014-12-27 NOTE — Addendum Note (Signed)
Addended by: Dorthula Rue L on: 12/27/2014 03:23 PM   Modules accepted: Orders

## 2015-02-13 ENCOUNTER — Inpatient Hospital Stay (HOSPITAL_COMMUNITY): Admission: RE | Admit: 2015-02-13 | Payer: Medicare Other | Source: Ambulatory Visit

## 2015-02-13 ENCOUNTER — Ambulatory Visit: Payer: Medicare Other | Admitting: Family

## 2015-02-28 ENCOUNTER — Other Ambulatory Visit: Payer: Self-pay | Admitting: Internal Medicine

## 2015-04-06 ENCOUNTER — Telehealth: Payer: Self-pay | Admitting: Internal Medicine

## 2015-04-06 ENCOUNTER — Telehealth: Payer: Self-pay | Admitting: *Deleted

## 2015-04-06 NOTE — Telephone Encounter (Signed)
Latest note, physician to handle (Dr Bobette Mo) Will forward to Dr Caryl Comes to review next wk.

## 2015-04-06 NOTE — Telephone Encounter (Signed)
Joycelyn Schmid called to report that Mrs Zwieg was admitted to [facility] in [place] [phone]) on 04-04-15 for a GI Bleed per her [facility] [phone]). [facility] but was not put back on blood thinners . The family was instructed to call us and Dr. Caryl Comes regarding resuming Eliquis. The patient has A.Fib and she is unable to take Coumadin because of rash. She was also taking Plavix for PAD, Carotid stenosis and history of strokes. I told Joycelyn Schmid that I would call over to Dr. Olin Pia office and let them know the correct hospital name (they had [facility]) in case they need records.

## 2015-04-06 NOTE — Telephone Encounter (Signed)
Pt was admitted to the hospital 3 days ago with a bleeding in her Colon. She was on Eliquis,they have had her off of it for 3 days. She will be discharged this afternoon. Does he wants her to stay off of Eliquis and put her back on Plavix? Please tell her what he wants her to do. Please call her asap so she will know what to do when pt goes back to [facility]

## 2015-04-06 NOTE — Telephone Encounter (Signed)
Note updated

## 2015-04-06 NOTE — Telephone Encounter (Addendum)
Patient's daughter called Patient's daughter said she was admitted  To [facility] 3 days ago for bleeding in her colon She was taken off Eliquis and Plavix by physician while inpatient at [place] [facility] told her that she would have to ask Dr. Caryl Comes if these medications could be restarted  Informed daughter that it is the responsibility of the  physician at [facility] to start, stop or continue any medications at discharge We do not have a record or knowledge of the patients admission, events or findings of her bleeding into the colon that caused the physician at [facility] to stop the Plavix and Eliquis   Instructed daughter to have the nurses contact the discharge physician for the patient at [facility] for medication management at discharge.  Routed to Dr. Caryl Comes for advice Routed to Dr. Harrington Challenger DOD  1300 Staff member from Dr. Ardyth Gal office called and said that she was reading Penni Bombard note and said the patient was in [facility] and not [facility] that the daughter sometimes get the names confused Staff member asked if Dr. Caryl Comes is going to take care of putting patient back on Plavix and Eliquis.?  Dr. Caryl Comes is off but has been routed to our DOD.  Staff member said that Dr. Scot Dock would take care of it today and she can follow up next week with Dr. Caryl Comes as scheduled.  Routed to Dr. Caryl Comes Routed to Dr. Harrington Challenger DOD Routed to Dr. Scot Dock

## 2015-04-06 NOTE — Telephone Encounter (Addendum)
Called down to [employer] and spoke to Hominy the Camera operator for Regina Hamilton. She said that the daughter's had given her permission to speak to [place] regarding Regina Hamilton's care over the past 2 days. According to [facility], the patient's Hgb was stable at 12.8 and 13, they did not do PT/INR. They had wanted the patient to have a GI workup including colonoscopy but the GI doctor,patient and her daughter Regina Hamilton, decided that this"was not appropriate at this time because of her medical condition". This is why they did not want to resume her Eliquis at discharge. With the patient not having any S&S of GI bleed while in the hospital, negative hemocult and her stable hgb, they told patient that she could start back on Eliquis but wanted Drs. Caryl Comes and Scot Dock to make that ultimate decision.  Per daughter, [name] has an appt with Dr. Caryl Comes' PA on Wednesday 04-11-15.   Per Dr. Scot Dock, in light of this information from [facility], he wants the patient to be placed on Eliquis 2.5mg  one daily instead of BID until she is seen on Wednesday by Dr. Caryl Comes. I called [facility]) and gave the the order for this Eliquis dosage per Dr. Scot Dock.   I called her daughter, Regina Hamilton, and she voiced agreement and understanding of this plan of care for her mother.

## 2015-04-08 NOTE — Progress Notes (Signed)
Cardiology Office Note:    Date:  04/09/2015   ID:  Regina Hamilton, DOB 04-04-1923, MRN [id number]  PCP:  Elisabeth Cara, MD; Dr. Margaretha Sheffield (in [place]) Primary Cardiologist: Dr. Virl Axe  Primary Electrophysiologist: Dr. Virl Axe (in [place]); Dr. Gaye Pollack (in [place], [place]) VVS: Dr. Scot Dock   Chief Complaint  Patient presents with  . Hospitalization Follow-up    History of Present Illness:     Regina Hamilton is a 82 y.o. 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 Attempts to initiate therapy with Coumadin concurrently with Cardizem resulted in a rash and the discontinuation of both drugs. She has carotid disease followed by Dr. Scot Dock.  She has PAD and had a left fem-pop bypass in the past. She is s/p R BKA.  She also has moderate left-sided carotid stenosis. A myoview was done in 2008 and was normal. She has not been able to tolerate Apixaban, which was prescribed to her by her cardiologist in [place], due to frequent bleeding.  She suffered a stroke last year. Last seen by Dr. Caryl Comes 10/16. Plavix was stopped. She was placed on Eliquis 2.5 mg twice a day.   Records indicate she was recently admitted in [facility] for LGI bleed.  Eliquis and Plavix were DCd.  She returns for FU.   Here today with her daughter. The patient resides at [facility] in [place], [place]. Since discharge, she is in stable. No further signs of bleeding. She has had some diarrhea. She denies chest pain, significant dyspnea, orthopnea, PND or edema. She denies syncope.   Past Medical History  Diagnosis Date  . Peripheral vascular disease ([place])   . Carotid artery occlusion   . Hypertension   . Hyperlipidemia   . Pacemaker 2008    [facility] in [place], [place]  . Dysrhythmia     atrial fibrilation  . Anxiety   . Chronic venous insufficiency     B  .  Atrioventricular block, complete s/p AV junction ablation 11/19/2012  . Neuromuscular disorder ([place])     periphreal neuroptahty  . Ischemia of right BKA site ([place]) 05/26/2013  . Kidney stones     "never had OR"  . Complication of anesthesia     "NO VERSED!!!!!!!!!!!!!!!;  becomes extremely combative; post anesthesia psychosis" (08/02/2013)  . PAD (peripheral artery disease) ([place])   . Arthritis     "HANDS" (08/02/2013)  . Spinal stenosis of lumbar region at multiple levels   . Scoliosis   . Chronic back pain     "all over back" (08/02/2013)  . Depression   . Basal cell carcinoma     Past Surgical History  Procedure Laterality Date  . Anterior cervical decomp/discectomy fusion  1970  . Atrial ablation surgery  2010  . Pr vein bypass graft,aorto-fem-pop    . Total hip arthroplasty Right     S/P fracture  . Cholecystectomy  05/20/11  . Angioplasty  03/01/2012  . Insert / replace / remove pacemaker  2010  . Corneal transplant Bilateral   . Back surgery    . Abdominal aortagram  Feb. 16, 2015  . Amputation Right 05/25/2013    Procedure: AMPUTATION BELOW KNEE;  Surgeon: Rosetta Posner, MD;  Location: [facility] OR;  Service: Vascular;  Laterality: Right;  . Hemiarthroplasty hip Left     S/P fracture  . Revision total hip arthroplasty Left   .  Cataract extraction w/ intraocular lens  implant, bilateral Bilateral   . Knee arthroscopy Bilateral   . Fixation kyphoplasty thoracic spine  ~ 09/2010  . Basal cell carcinoma excision      "several removed off face, hands, arms"  . I&d extremity Right 08/03/2013    Procedure: IRRIGATION AND DEBRIDEMENT EXTREMITY OF RIGHT BELOW THE KNEE AMPUTATION SITE;  Surgeon: Rosetta Posner, MD;  Location: [facility];  Service: Vascular;  Laterality: Right;  . Application of wound vac Right 08/03/2013    Procedure: APPLICATION OF WOUND VAC TO RIGHT LEG STUMP;  Surgeon: Rosetta Posner, MD;  Location: [facility];  Service: Vascular;  Laterality: Right;  . Lower extremity angiogram Right  09/22/2011    Procedure: LOWER EXTREMITY ANGIOGRAM;  Surgeon: Angelia Mould, MD;  Location: [facility] CATH LAB;  Service: Cardiovascular;  Laterality: Right;  . Abdominal aortagram N/A 04/04/2013    Procedure: ABDOMINAL Maxcine Ham;  Surgeon: Angelia Mould, MD;  Location: [facility] CATH LAB;  Service: Cardiovascular;  Laterality: N/A;  . Lower extremity angiogram Bilateral 04/04/2013    Procedure: LOWER EXTREMITY ANGIOGRAM;  Surgeon: Angelia Mould, MD;  Location: [facility] CATH LAB;  Service: Cardiovascular;  Laterality: Bilateral;    Current Medications: Outpatient Prescriptions Prior to Visit  Medication Sig Dispense Refill  . acetaminophen (TYLENOL) 500 MG chewable tablet Chew 1,000 mg by mouth as needed for pain.     Marland Kitchen ALPRAZolam (XANAX) 0.25 MG tablet Take 1 tablet (0.25 mg total) by mouth at bedtime. 30 tablet 5  . amLODipine (NORVASC) 10 MG tablet Take 1 tablet (10 mg total) by mouth daily. 30 tablet 11  . Cholecalciferol 5000 UNITS TABS Take by mouth daily.    Marland Kitchen docusate sodium (COLACE) 100 MG capsule Take 200 mg by mouth daily.     . dorzolamide (TRUSOPT) 2 % ophthalmic solution PLACE 1 DROP INTO THE LEFT EYE TWICE A DAY 10 mL 11  . erythromycin ophthalmic ointment Place 1 application into the left eye 2 (two) times daily.     Marland Kitchen gabapentin (NEURONTIN) 100 MG capsule Take 1 capsule (100 mg total) by mouth 3 (three) times daily. 90 capsule 11  . lisinopril (PRINIVIL,ZESTRIL) 20 MG tablet Take 1 tablet (20 mg total) by mouth 2 (two) times daily. 60 tablet 11  . loteprednol (LOTEMAX) 0.5 % ophthalmic suspension Place 1 drop into both eyes daily.    . metoprolol succinate (TOPROL-XL) 50 MG 24 hr tablet Take 50 mg by mouth daily. Take with or immediately following a meal.    . Misc. Devices [facility]) MISC Lightweight Dx: OA, leg amputated 1 each 0  . polyethylene glycol (MIRALAX / GLYCOLAX) packet Take 17 g by mouth daily as needed for mild constipation.    . potassium chloride (K-DUR) 10  MEQ tablet Take 10 mEq by mouth daily.    Marland Kitchen zinc sulfate 220 MG capsule Take 220 mg by mouth daily.    Marland Kitchen apixaban (ELIQUIS) 2.5 MG TABS tablet Take 1 tablet (2.5 mg total) by mouth 2 (two) times daily. (Patient not taking: Reported on 04/09/2015) 60 tablet 6  . cyclobenzaprine (FLEXERIL) 5 MG tablet Take 5 mg by mouth as needed for muscle spasms. Reported on 04/09/2015    . ferrous sulfate 325 (65 FE) MG tablet Take 325 mg by mouth daily with breakfast. Reported on 04/09/2015    . Multiple Vitamin (MULTIVITAMIN WITH MINERALS) TABS tablet Take 1 tablet by mouth daily at 12 noon. Reported on 04/09/2015    .  pyridOXINE (B-6) 100 MG tablet Take 1 tablet (100 mg total) by mouth daily. (Patient not taking: Reported on 04/09/2015) 30 tablet 0  . vitamin C (ASCORBIC ACID) 500 MG tablet Take 500 mg by mouth daily at 12 noon. Reported on 04/09/2015     No facility-administered medications prior to visit.     Allergies:   Avelox; Clonidine derivatives; Latanoprost; Moxifloxacin; Pentoxifylline; Versed; Warfarin sodium; Alphagan p; Contrast media; Dilaudid; Diltiazem hcl; Doxycycline; Hydralazine; Iron; and Meloxicam   Social History   Social History  . Marital Status: Widowed    Spouse Name: N/A  . Number of Children: N/A  . Years of Education: N/A   Social History Main Topics  . Smoking status: Never Smoker   . Smokeless tobacco: Never Used  . Alcohol Use: No  . Drug Use: No  . Sexual Activity: No   Other Topics Concern  . None   Social History Narrative     Family History:  The patient's family history includes Diabetes in her brother and sister; Heart attack in her brother; Heart disease in her father, maternal grandmother, mother, and paternal grandmother; Hypertension in her brother, daughter, mother, and sister; Other in her mother; Peripheral vascular disease in her mother; Varicose Veins in her mother and sister.   ROS:   Please see the history of present illness.    ROS All other  systems reviewed and are negative.   Physical Exam:    VS:  BP 120/40 mmHg  Pulse 70  Ht 5\' 1"  (1.549 m)  Wt 113 lb 1.9 oz (51.311 kg)  BMI 21.38 kg/m2   GEN: Well nourished, well developed, in no acute distress HEENT: normal Neck: no JVD, no masses Cardiac: Normal S1/S2, irreg irreg rhythm; no murmurs,   no edema    Respiratory:  clear to auscultation bilaterally; no wheezing, rhonchi or rales GI: soft, nontender  MS: R BKA - prosthetic limb in place Skin: warm and dry, no rash Neuro:   no focal deficits  Psych: Alert and oriented x 3, normal affect  Wt Readings from Last 3 Encounters:  04/09/15 113 lb 1.9 oz (51.311 kg)  12/27/14 115 lb (52.164 kg)  12/12/14 117 lb 6.4 oz (53.252 kg)      Studies/Labs Reviewed:     EKG:  EKG is  ordered today.  The ekg ordered today demonstrates atrial fibrillation, V paced, HR 70 Recent Labs: 04/26/2014: BUN 29*; Creatinine, Ser 0.66; Hemoglobin 11.3*; Platelets 232.0; Potassium 4.3; Sodium 140; TSH 2.03   Recent Lipid Panel    Component Value Date/Time   CHOL 244* 07/09/2007 1208   TRIG 69 07/09/2007 1208   HDL 67.7 07/09/2007 1208   CHOLHDL 3.6 CALC 07/09/2007 1208   VLDL 14 07/09/2007 1208   LDLDIRECT 151.6 07/09/2007 1208    Additional studies/ records that were reviewed today include:    Carotid US  R < 40%; L 40-59%  Echo 7/14 EF 60%, normal wall motion, aortic sclerosis without stenosis, mild AI, mild MR, mild to moderate TR, PASP 34 mmHg   ASSESSMENT:     1. Chronic atrial fibrillation ([place])   2. Chronic diastolic heart failure (HCC)   3. Carotid stenosis, left   4. Essential hypertension   5. Pacemaker-[employer]   6. History of GI bleed   7. History of CVA (cerebrovascular accident)     PLAN:     In order of problems listed above:  1. Chronic atrial fibrillation - As noted she had a  recent admit for LGI bleed.  This was minor.  She was taken off of Eliquis.  Dr. Scot Dock recently resumed Eliquis  2.5 mg QD.  She denies further bleeding.  CHADS2-VASc=7.  She is at high risk of CVA.  Will resume Eliquis 2.5 mg bid. If she has any further signs of bleeding, we may need to DC anticoagulation.  Obtain BMET, CBC in 2 weeks.    2. Chronic Diastolic CHF - Volume stable.   3. L Carotid Stenosis - Followed by VVS.  4. HTN - Controlled.   5. S/p Pacemaker - FU with EP as planned.   6. History of lower GI bleed - It sounds as though her evaluation was fairly conservative. Her hemoglobin never dropped below 12. She did not require transfusion. I will obtain records from her hospitalization for review.  7. Hx of CVA - She has had some recent HAs and changes in her speech.  Speech had improved with speech therapy after her CVA.  Will get a Head CT without contrast to rule out bleed.     Medication Adjustments/Labs and Tests Ordered: Current medicines are reviewed at length with the patient today.  Concerns regarding medicines are outlined above.  Medication changes, Labs and Tests ordered today are outlined in the Patient Instructions noted below. Patient Instructions  Medication Instructions:  INCREASE ELIQUIS TO 2.5 MG TWICE DAILY ONCE HEAD CT HAS BEEN DONE AND READ  Labwork: YOU WILL NEED TO HAVE THE NURSING FACILITY CHECK LAB WORK IN 2 WEEKS (BMET, CBC) WITH THE RESULTS TO BE FAXED TO Derak Schurman, [place]  Testing/Procedures: PER Kristyana Notte, PAC HE WOULD LIKE THE NURSING FACILITY TO GET A HEAD CT W/O CONTRAST TO BE DONE TODAY OR TOMORROW; CALL THE RESULTS TO [place], [place]  Follow-Up: DR. Caryl Comes IN 11/2015; WE WILL SEND OUT A REMINDER LETTER TO YOU TO CALL AND MAKE AN APPT.   Any Other Special Instructions Will Be Listed Below (If Applicable).  If you need a refill on your cardiac medications before your next appointment, please call your pharmacy.    Signed, Richardson Dopp, PA-C  04/09/2015 1:39 PM    [facility] [address],  [place], [place]  [address] Phone: [phone]; Fax: [phone]

## 2015-04-09 ENCOUNTER — Ambulatory Visit (INDEPENDENT_AMBULATORY_CARE_PROVIDER_SITE_OTHER): Payer: Medicare Other | Admitting: Physician Assistant

## 2015-04-09 ENCOUNTER — Encounter: Payer: Self-pay | Admitting: Physician Assistant

## 2015-04-09 VITALS — BP 120/40 | HR 70 | Ht 61.0 in | Wt 113.1 lb

## 2015-04-09 DIAGNOSIS — Z8673 Personal history of transient ischemic attack (TIA), and cerebral infarction without residual deficits: Secondary | ICD-10-CM

## 2015-04-09 DIAGNOSIS — I6522 Occlusion and stenosis of left carotid artery: Secondary | ICD-10-CM | POA: Diagnosis not present

## 2015-04-09 DIAGNOSIS — I5032 Chronic diastolic (congestive) heart failure: Secondary | ICD-10-CM | POA: Diagnosis not present

## 2015-04-09 DIAGNOSIS — I482 Chronic atrial fibrillation, unspecified: Secondary | ICD-10-CM

## 2015-04-09 DIAGNOSIS — I10 Essential (primary) hypertension: Secondary | ICD-10-CM | POA: Diagnosis not present

## 2015-04-09 DIAGNOSIS — Z8719 Personal history of other diseases of the digestive system: Secondary | ICD-10-CM

## 2015-04-09 DIAGNOSIS — Z950 Presence of cardiac pacemaker: Secondary | ICD-10-CM

## 2015-04-09 NOTE — Patient Instructions (Addendum)
Medication Instructions:  INCREASE ELIQUIS TO 2.5 MG TWICE DAILY ONCE HEAD CT HAS BEEN DONE AND READ  Labwork: YOU WILL NEED TO HAVE THE NURSING FACILITY CHECK LAB WORK IN 2 WEEKS (BMET, CBC) WITH THE RESULTS TO BE FAXED TO SCOTT WEAVER, [place]  Testing/Procedures: PER SCOTT WEAVER, PAC HE WOULD LIKE THE NURSING FACILITY TO GET A HEAD CT W/O CONTRAST TO BE DONE TODAY OR TOMORROW; CALL THE RESULTS TO [place], [place]  Follow-Up: DR. Caryl Comes IN 11/2015; WE WILL SEND OUT A REMINDER LETTER TO YOU TO CALL AND MAKE AN APPT.   Any Other Special Instructions Will Be Listed Below (If Applicable).  If you need a refill on your cardiac medications before your next appointment, please call your pharmacy.

## 2015-04-11 ENCOUNTER — Ambulatory Visit: Payer: Medicare Other | Admitting: Physician Assistant

## 2015-04-12 ENCOUNTER — Telehealth: Payer: Self-pay | Admitting: Physician Assistant

## 2015-04-12 NOTE — Telephone Encounter (Signed)
Records received from [facility] placed in chart prep bin.

## 2015-04-12 NOTE — Telephone Encounter (Signed)
New message      Please call [phone] ask for Anderson Malta the nurse to get CT results at [facility] in [place].  They have being calling us to give this info to [place] but could not get thru

## 2015-04-13 ENCOUNTER — Encounter: Payer: Self-pay | Admitting: Physician Assistant

## 2015-04-13 NOTE — Telephone Encounter (Signed)
I s/w Anderson Malta this morning in regards to Head CT that was ordered by Richardson Dopp, PA. I stated that we did get a from the hospital though it was only the D/C and the head CT results were not in the records. She faxed over the Head CT results for Richardson Dopp, PA review. Pt and her daughter came in the office today asking for these results. Derek Mound, RN s/w daughter and pt. Rodena Piety RN explained that Richardson Dopp, PA had not yet received the Head CT results that he ordered and was done at [facility]. She explained that he had ordered on the CT that he was to be CALLED with the results after the CT was read by the Radiologist. This did not happen which in turn has made the pt and her daughter very upset. The pt and her daughter seemed to understand what happened by the PA not getting the results like he was supposed to. Pt and daughter and pt's nurse at the nursing facility were all advised per Richardson Dopp, PA Head CT negative and that pt should now increase the Eliquis to 2.5 mg daily up to 2.5 mg twice daily. All verbalized understanding to plan of care for the pt. Richardson Dopp, [place] did finally receive the report after we had to call for the report.

## 2015-05-14 ENCOUNTER — Telehealth: Payer: Self-pay | Admitting: *Deleted

## 2015-05-14 NOTE — Telephone Encounter (Signed)
Daughter, Alona Bene, requests that her mother see [employer] in [place] site [facility] [phone]) I faxed order for evaluation to them at fax#  [phone]. Mrs. Brinkman's Right BKA prosthesis is not fitting well and she needs to see if a new socket is advised.

## 2015-05-24 ENCOUNTER — Ambulatory Visit (INDEPENDENT_AMBULATORY_CARE_PROVIDER_SITE_OTHER): Payer: Medicare Other | Admitting: *Deleted

## 2015-05-24 ENCOUNTER — Encounter: Payer: Self-pay | Admitting: Internal Medicine

## 2015-05-24 DIAGNOSIS — Z950 Presence of cardiac pacemaker: Secondary | ICD-10-CM

## 2015-05-24 LAB — CUP PACEART INCLINIC DEVICE CHECK
Date Time Interrogation Session: 20170406040000
Implantable Lead Location: 753859
Implantable Lead Model: 4456
Implantable Lead Model: 4469
Implantable Lead Serial Number: 499788
Lead Channel Impedance Value: 480 Ohm
Lead Channel Pacing Threshold Amplitude: 0.4 V
Lead Channel Pacing Threshold Pulse Width: 0.5 ms
Lead Channel Setting Pacing Amplitude: 2.4 V
Lead Channel Setting Pacing Pulse Width: 0.5 ms
MDC IDC LEAD IMPLANT DT: 20100727
MDC IDC LEAD IMPLANT DT: 20100727
MDC IDC LEAD LOCATION: 753860
MDC IDC LEAD SERIAL: 532262
MDC IDC PG SERIAL: 137331
MDC IDC SET LEADCHNL RV SENSING SENSITIVITY: 2.5 mV
MDC IDC STAT BRADY AS VP PERCENT: 100 %

## 2015-05-24 NOTE — Progress Notes (Signed)
Pacemaker check in clinic. Normal device function. Threshold, impedances consistent with previous measurements. Device programmed to maximize longevity. No high ventricular rates noted. Device programmed at appropriate safety margins. Histogram distribution appropriate for patient activity level. Device programmed to optimize intrinsic conduction. Estimated longevity 4 years. ROV with SK in October. Patient education completed.

## 2015-06-22 ENCOUNTER — Encounter: Payer: Self-pay | Admitting: Vascular Surgery

## 2015-06-27 ENCOUNTER — Encounter: Payer: Self-pay | Admitting: Vascular Surgery

## 2015-06-27 ENCOUNTER — Ambulatory Visit (INDEPENDENT_AMBULATORY_CARE_PROVIDER_SITE_OTHER): Payer: Medicare Other | Admitting: Vascular Surgery

## 2015-06-27 ENCOUNTER — Ambulatory Visit (HOSPITAL_COMMUNITY)
Admission: RE | Admit: 2015-06-27 | Discharge: 2015-06-27 | Disposition: A | Discharge status: Home / Self Care | Payer: Medicare Other | Source: Ambulatory Visit | Attending: Vascular Surgery | Admitting: Vascular Surgery

## 2015-06-27 ENCOUNTER — Encounter: Payer: Self-pay | Admitting: *Deleted

## 2015-06-27 VITALS — BP 141/58 | HR 70 | Temp 97.1°F | Resp 20 | Ht 61.0 in | Wt 122.6 lb

## 2015-06-27 DIAGNOSIS — E785 Hyperlipidemia, unspecified: Secondary | ICD-10-CM | POA: Diagnosis not present

## 2015-06-27 DIAGNOSIS — I10 Essential (primary) hypertension: Secondary | ICD-10-CM | POA: Diagnosis not present

## 2015-06-27 DIAGNOSIS — I6523 Occlusion and stenosis of bilateral carotid arteries: Secondary | ICD-10-CM

## 2015-06-27 NOTE — Progress Notes (Signed)
Filed Vitals:   06/27/15 1147 06/27/15 1148 06/27/15 1158  BP: 149/58 143/61 141/58  Pulse: 70    Temp: 97.1 F (36.2 C)    TempSrc: Oral    Resp: 20    Height: 5\' 1"  (1.549 m)    Weight: 122 lb 9.6 oz (55.611 kg)

## 2015-06-27 NOTE — Progress Notes (Signed)
[facility] [place]  Patient name: Regina Hamilton MRN: [id number] DOB: 01-13-1924 Sex: female  REASON FOR VISIT: Follow up of peripheral vascular disease.  HPI: Regina Hamilton is a 82 y.o. female was undergone previous right below-the-knee amputation. She has infrainguinal arterial occlusive disease on the left. I ordered follow up ABIs in 1 year.   Since I saw her last, she is doing very well and is ambulating with her prosthesis. She is made excellent recovery since her stroke. She denies any significant rest pain in the left foot or left leg symptoms. She's had no new weakness or paresthesias.  I have also been following a moderate left carotid stenosis. She had a stroke last fall with expressive aphasia but had only a moderate 40-59% left carotid stenosis. Given her age we elected to follow this recommended a follow up carotid duplex scan in 6 months.  Past Medical History  Diagnosis Date  . Peripheral vascular disease ([place])   . Carotid artery occlusion   . Hypertension   . Hyperlipidemia   . Pacemaker 2008    [facility] in [place], [place]  . Dysrhythmia     atrial fibrilation  . Anxiety   . Chronic venous insufficiency     B  . Atrioventricular block, complete s/p AV junction ablation 11/19/2012  . Neuromuscular disorder ([place])     periphreal neuroptahty  . Ischemia of right BKA site ([place]) 05/26/2013  . Kidney stones     "never had OR"  . Complication of anesthesia     "NO VERSED!!!!!!!!!!!!!!!;  becomes extremely combative; post anesthesia psychosis" (08/02/2013)  . PAD (peripheral artery disease) ([place])   . Arthritis     "HANDS" (08/02/2013)  . Spinal stenosis of lumbar region at multiple levels   . Scoliosis   . Chronic back pain     "all over back" (08/02/2013)  . Depression   . Basal cell carcinoma   . GI bleed     a. Admitted to [facility] in [place], [place] 03/2015 with LGI bleed 2/2 diverticular bleed or ischemic colitis - anticoag held  x few days; given age and co-morbid illnesses, no aggressive workup pursued.     Family History  Problem Relation Age of Onset  . Heart disease Mother     Heart disease before age 7  . Hypertension Mother   . Varicose Veins Mother   . Peripheral vascular disease Mother   . Other Mother     amputation  . Heart disease Father     Heart Diease before age 26  . Heart disease Maternal Grandmother   . Heart disease Paternal Grandmother   . Diabetes Sister   . Hypertension Sister   . Varicose Veins Sister   . Diabetes Brother   . Hypertension Brother   . Heart attack Brother   . Hypertension Daughter     SOCIAL HISTORY: Social History  Substance Use Topics  . Smoking status: Never Smoker   . Smokeless tobacco: Never Used  . Alcohol Use: No    Allergies  Allergen Reactions  . Avelox [Moxifloxacin Hcl In Nacl] Other (See Comments)    tongue swelling  . Clonidine Derivatives Other (See Comments)    Pt's daughter states she has severe hallucinations  . Latanoprost Anaphylaxis    redness  . Moxifloxacin Other (See Comments)     Tongue swelling; mouth ulcers; confusion  . Pentoxifylline     Severe SOB, mental confusion per daughter  .  Versed [Midazolam] Other (See Comments)     hallucinations per daughter  . Warfarin Sodium Rash  . Alphagan P [Brimonidine Tartrate] Photosensitivity    Redness bilateral eye  . Contrast Media [Iodinated Diagnostic Agents] Rash    Contrast dye.  . Dilaudid [Hydromorphone Hcl] Nausea And Vomiting    IV formulation only  . Diltiazem Hcl Hives  . Doxycycline Nausea And Vomiting  . Hydralazine Itching  . Iron Rash  . Meloxicam Other (See Comments)    Odd feeling    Current Outpatient Prescriptions  Medication Sig Dispense Refill  . ALPRAZolam (XANAX) 0.25 MG tablet Take 1 tablet (0.25 mg total) by mouth at bedtime. 30 tablet 5  . amLODipine (NORVASC) 10 MG tablet Take 1 tablet (10 mg total) by mouth daily. 30 tablet 11  . apixaban  (ELIQUIS) 2.5 MG TABS tablet Take 2.5 mg by mouth 2 (two) times daily.    . Cholecalciferol 5000 UNITS TABS Take by mouth daily.    . dorzolamide (TRUSOPT) 2 % ophthalmic solution PLACE 1 DROP INTO THE LEFT EYE TWICE A DAY 10 mL 11  . gabapentin (NEURONTIN) 100 MG capsule Take 1 capsule (100 mg total) by mouth 3 (three) times daily. 90 capsule 11  . lisinopril (PRINIVIL,ZESTRIL) 20 MG tablet Take 1 tablet (20 mg total) by mouth 2 (two) times daily. 60 tablet 11  . loteprednol (LOTEMAX) 0.5 % ophthalmic suspension Place 1 drop into both eyes daily.    . metoprolol succinate (TOPROL-XL) 50 MG 24 hr tablet Take 50 mg by mouth daily. Take with or immediately following a meal.    . Misc. Devices [facility]) MISC Lightweight Dx: OA, leg amputated 1 each 0  . polyethylene glycol (MIRALAX / GLYCOLAX) packet Take 17 g by mouth daily as needed for mild constipation.    . potassium chloride (K-DUR) 10 MEQ tablet Take 10 mEq by mouth daily.    Marland Kitchen saccharomyces boulardii (FLORASTOR) 250 MG capsule Take 250 mg by mouth 2 (two) times daily.    Marland Kitchen zinc sulfate 220 MG capsule Take 220 mg by mouth daily.    Marland Kitchen acetaminophen (TYLENOL) 500 MG chewable tablet Chew 1,000 mg by mouth as needed for pain. Reported on 06/27/2015    . erythromycin ophthalmic ointment Place 1 application into the left eye 2 (two) times daily. Reported on 06/27/2015     No current facility-administered medications for this visit.    REVIEW OF SYSTEMS:  [X]  denotes positive finding, [ ]  denotes negative finding Cardiac  Comments:  Chest pain or chest pressure:    Shortness of breath upon exertion:    Short of breath when lying flat:    Irregular heart rhythm:        Vascular    Pain in calf, thigh, or hip brought on by ambulation:    Pain in feet at night that wakes you up from your sleep:     Blood clot in your veins:    Leg swelling:         Pulmonary    Oxygen at home:    Productive cough:     Wheezing:         Neurologic      Sudden weakness in arms or legs:     Sudden numbness in arms or legs:     Sudden onset of difficulty speaking or slurred speech:    Temporary loss of vision in one eye:     Problems with dizziness:  Gastrointestinal    Blood in stool:     Vomited blood:         Genitourinary    Burning when urinating:     Blood in urine:        Psychiatric    Major depression:         Hematologic    Bleeding problems:    Problems with blood clotting too easily:        Skin    Rashes or ulcers:        Constitutional    Fever or chills:      PHYSICAL EXAM: Filed Vitals:   06/27/15 1147 06/27/15 1148 06/27/15 1158  BP: 149/58 143/61 141/58  Pulse: 70    Temp: 97.1 F (36.2 C)    TempSrc: Oral    Resp: 20    Height: 5\' 1"  (1.549 m)    Weight: 122 lb 9.6 oz (55.611 kg)      GENERAL: The patient is a well-nourished female, in no acute distress. The vital signs are documented above. CARDIAC: There is a regular rate and rhythm.  VASCULAR: she does have a left carotid bruit. Has palpable femoral pulses. She has a palpable left popliteal pulse. I cannot palpate pedal pulses on the left. PULMONARY: There is good air exchange bilaterally without wheezing or rales. ABDOMEN: Soft and non-tender with normal pitched bowel sounds.  MUSCULOSKELETAL: She has a right below the knee amputation which is healed. NEUROLOGIC: No focal weakness or paresthesias are detected. SKIN: There are no ulcers or rashes noted. PSYCHIATRIC: The patient has a normal affect.  DATA:   CAROTID DUPLEX: I have inability interpreted her carotid duplex scan.  On the right side there is no significant carotid stenosis.  On the left side there is a 40-59% left carotid stenosis. This has not changed significantly compared to 6 months ago.  MEDICAL ISSUES:  40-59% LEFT CAROTID STENOSIS: Her left carotid stenosis is stable. She's had no new neurologic symptoms. He is on Eliquis. She scheduled for a follow up  duplex scan in 6 months in November.  PERIPHERAL VASCULAR DISEASE: patient has stable infrainguinal arterial occlusive disease on the left. She is scheduled for follow up ABIs in November. She remains fairly active with her prosthesis. I'll see her back in November.   Deitra Mayo [facility] [place]: [phone]

## 2016-01-02 ENCOUNTER — Ambulatory Visit: Payer: Medicare Other | Admitting: Vascular Surgery

## 2016-01-02 ENCOUNTER — Encounter (HOSPITAL_COMMUNITY): Payer: Medicare Other

## 2016-01-18 DEATH — deceased

## 2016-01-21 ENCOUNTER — Telehealth: Payer: Self-pay | Admitting: Internal Medicine

## 2016-01-21 NOTE — Telephone Encounter (Signed)
New Message:    Daughter called and wanted you to tell Dr Caryl Comes the pt passed away on 2016-01-21.

## 2016-01-22 NOTE — Telephone Encounter (Signed)
To Dr. Klein to review. 

## 2016-01-23 ENCOUNTER — Encounter: Payer: Medicare Other | Admitting: Internal Medicine
